# Patient Record
Sex: Male | Born: 1935 | Race: White | Hispanic: No | Marital: Married | Smoking: Former smoker
Health system: Southern US, Community
[De-identification: ages and names within clinical notes are randomized; demographics above are authoritative.]

## PROBLEM LIST (undated history)

## (undated) DIAGNOSIS — G2581 Restless legs syndrome: Secondary | ICD-10-CM

## (undated) DIAGNOSIS — E559 Vitamin D deficiency, unspecified: Secondary | ICD-10-CM

## (undated) DIAGNOSIS — I2510 Atherosclerotic heart disease of native coronary artery without angina pectoris: Secondary | ICD-10-CM

## (undated) DIAGNOSIS — I509 Heart failure, unspecified: Secondary | ICD-10-CM

## (undated) DIAGNOSIS — G4733 Obstructive sleep apnea (adult) (pediatric): Secondary | ICD-10-CM

## (undated) DIAGNOSIS — Z87891 Personal history of nicotine dependence: Secondary | ICD-10-CM

## (undated) DIAGNOSIS — I255 Ischemic cardiomyopathy: Secondary | ICD-10-CM

## (undated) DIAGNOSIS — M1990 Unspecified osteoarthritis, unspecified site: Secondary | ICD-10-CM

## (undated) DIAGNOSIS — M109 Gout, unspecified: Secondary | ICD-10-CM

## (undated) DIAGNOSIS — N289 Disorder of kidney and ureter, unspecified: Secondary | ICD-10-CM

## (undated) DIAGNOSIS — I4891 Unspecified atrial fibrillation: Secondary | ICD-10-CM

## (undated) DIAGNOSIS — M069 Rheumatoid arthritis, unspecified: Secondary | ICD-10-CM

## (undated) DIAGNOSIS — H9190 Unspecified hearing loss, unspecified ear: Secondary | ICD-10-CM

## (undated) DIAGNOSIS — E785 Hyperlipidemia, unspecified: Secondary | ICD-10-CM

## (undated) DIAGNOSIS — Z9989 Dependence on other enabling machines and devices: Secondary | ICD-10-CM

## (undated) DIAGNOSIS — Z9981 Dependence on supplemental oxygen: Secondary | ICD-10-CM

## (undated) DIAGNOSIS — K589 Irritable bowel syndrome without diarrhea: Secondary | ICD-10-CM

## (undated) DIAGNOSIS — J449 Chronic obstructive pulmonary disease, unspecified: Secondary | ICD-10-CM

## (undated) DIAGNOSIS — E119 Type 2 diabetes mellitus without complications: Secondary | ICD-10-CM

## (undated) HISTORY — DX: Unspecified atrial fibrillation: I48.91

## (undated) HISTORY — PX: FINGER AMPUTATION: SHX636

## (undated) HISTORY — DX: Hyperlipidemia, unspecified: E78.5

## (undated) HISTORY — DX: Atherosclerotic heart disease of native coronary artery without angina pectoris: I25.10

## (undated) HISTORY — DX: Disorder of kidney and ureter, unspecified: N28.9

## (undated) HISTORY — PX: TONSILLECTOMY: SUR1361

## (undated) HISTORY — PX: CARDIAC SURGERY: SHX584

## (undated) HISTORY — DX: Heart failure, unspecified: I50.9

## (undated) HISTORY — PX: NECK SURGERY: SHX720

## (undated) HISTORY — DX: Unspecified osteoarthritis, unspecified site: M19.90

## (undated) HISTORY — PX: APPENDECTOMY: SHX54

## (undated) HISTORY — DX: Irritable bowel syndrome, unspecified: K58.9

## (undated) HISTORY — DX: Type 2 diabetes mellitus without complications: E11.9

## (undated) HISTORY — DX: Chronic obstructive pulmonary disease, unspecified: J44.9

## (undated) HISTORY — DX: Ischemic cardiomyopathy: I25.5

---

## 2016-10-02 NOTE — Progress Notes (Signed)
HPI: 82 year old male for evaluation of CAD and atrial fibrillation. Patient has an extensive cardiac history. He was previously cared for in [facility]. I have no records available. 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 He apparently had bypass approximately 8 years ago which was complicated by bleeding and renal insufficiency. He has an ischemic cardiomyopathy but I have no details concerning ejection fraction. He has permanent atrial fibrillation and apparently has not been able to be on any anticoagulants or even aspirin because of recurrent nosebleeds. He has not tolerated Lipitor previously. He also has chronic COPD and uses home oxygen at night and occasionally during the day. He has significant dyspnea on exertion but no orthopnea, PND or chest pain. Also no back pain which is his symptoms at time of prior infarct. Occasional mild pedal edema.  Current Outpatient Prescriptions  Medication Sig Dispense Refill  . acetaminophen (TYLENOL) 500 MG tablet Take 500 mg by mouth every 6 (six) hours as needed.    Marland Kitchen allopurinol (ZYLOPRIM) 300 MG tablet Take 300 mg by mouth daily.    . bumetanide (BUMEX) 1 MG tablet Take 1 mg by mouth daily.    . carvedilol (COREG) 25 MG tablet Take 25 mg by mouth 2 (two) times daily with a meal.    . Cholecalciferol (VITAMIN D3) 1000 units CAPS Take by mouth as directed.    . Cholestyramine POWD by Does not apply route as directed.    . Coenzyme Q10 (CO Q-10) 100 MG CAPS Take by mouth as directed.    . diphenhydrAMINE (ALLERGY RELIEF) 25 mg capsule Take 25 mg by mouth every 6 (six) hours as needed.    . gabapentin (NEURONTIN) 100 MG capsule Take 100 mg by mouth 3 (three) times daily.    Marland Kitchen HYDROcodone-acetaminophen (NORCO/VICODIN) 5-325 MG tablet Take by mouth every 6 (six) hours as needed for moderate pain.    . Insulin Glargine (TOUJEO SOLOSTAR [place]) Inject into the skin as directed.    . Insulin Glulisine (APIDRA [place]) Inject into the skin as  directed.    . metolazone (ZAROXOLYN) 5 MG tablet Take 5 mg by mouth daily.    Marland Kitchen omeprazole (PRILOSEC) 20 MG capsule Take 20 mg by mouth daily.    . potassium chloride (KLOR-CON) 20 MEQ packet Take 20 mEq by mouth daily.    . pravastatin (PRAVACHOL) 20 MG tablet Take 20 mg by mouth daily.    . prednisoLONE 5 MG TABS tablet Take 5 mg by mouth as directed.    . predniSONE (DELTASONE) 10 MG tablet Take 10 mg by mouth as directed.    . promethazine (PHENERGAN) 25 MG tablet Take 25 mg by mouth every 6 (six) hours as needed for nausea or vomiting.    . terazosin (HYTRIN) 2 MG capsule Take 2 mg by mouth at bedtime.     No current facility-administered medications for this visit.     Not on File   Past Medical History:  Diagnosis Date  . A-fib (HCC)   . Arthritis   . CAD (coronary artery disease)   . COPD (chronic obstructive pulmonary disease) (HCC)   . Diabetes (HCC)   . Heart failure (HCC)   . Hyperlipidemia   . IBS (irritable bowel syndrome)   . Ischemic cardiomyopathy   . OSA (obstructive sleep apnea)   . Renal insufficiency     Past Surgical History:  Procedure Laterality Date  . APPENDECTOMY    . CARDIAC SURGERY  BY PASS  . FINGER AMPUTATION Left    RING FINGER  . NECK SURGERY    . TONSILLECTOMY      Social History   Social History  . Marital status: Married    Spouse name: JEAN  . Number of children: 3  . Years of education: COLLEGE   Occupational History  . RETIRED    Social History Main Topics  . Smoking status: Former Games developer  . Smokeless tobacco: Never Used  . Alcohol use No  . Drug use: No  . Sexual activity: Not on file   Other Topics Concern  . Not on file   Social History Narrative  . No narrative on file    Family History  Problem Relation Age of Onset  . CAD Mother   . CAD Father     ROS: Upper respiratory infection for the last 24-48 hours but no fevers or chills, hemoptysis, dysphasia, odynophagia, melena, hematochezia, dysuria,  hematuria, rash, seizure activity, claudication. Remaining systems are negative.  Physical Exam:   Blood pressure 122/78, pulse (!) 122, height 5\' 7"  (1.702 m), weight 195 lb 1.9 oz (88.5 kg).  General:  Well developed/well nourished in NAD Skin warm/dry Patient not depressed No peripheral clubbing Back-normal HEENT-normal/normal eyelids Neck supple/normal carotid upstroke bilaterally; no bruits; no JVD; no thyromegaly chest - CTA/ normal expansion CV - tachycardic and irregular/normal S1 and S2; no murmurs, rubs or gallops;  PMI nondisplaced Abdomen -NT/ND, no HSM, no mass, + bowel sounds, no bruit 2+ femoral pulses, no bruits Ext-no edema, chords, 2+ DP Neuro-grossly nonfocal  ECG -Atrial fibrillation at a rate of 122. Nonspecific ST changes.  A/P  1 coronary artery disease-we will need to obtain all outside records concerning prior bypass surgery, LV function and atrial fibrillation. He apparently has not tolerated aspirin previously because of severe nosebleeds. We will continue statin.  2 Permanent atrial fibrillation-patient apparently has permanent atrial fibrillation and has been years. We will continue carvedilol for rate control. His rate is elevated today and I wonder if this is being driven by his upper respiratory infection. I will arrange for 24-hour Holter monitor and we will likely add digoxin if his rate is elevated. CHADSvasc 5. He would benefit from anticoagulation but apparently has had significant bleeding even on aspirin. I will not add anything at this point. He understands there is a higher risk of stroke off of anticoagulation.  3 ischemic cardiomyopathy-we will plan to repeat echocardiogram to assess LV systolic function. I will obtain all outside records. Continue carvedilol. He would benefit also from an ARB (note cough with ACEI in past) but he apparently has significant renal insufficiency. We will obtain his most recent laboratories from primary care. If his  renal function will not tolerate an ACE inhibitor or ARB we will add hydralazine/nitrates. I would not consider an ICD given his age.  4 chronic stage IV kidney disease-obtain most recent laboratories from primary care.  5 chronic systolic congestive heart failure-he does not appear to be significantly volume overloaded. Continue present dose of diuretics. He is also apparently scheduled to see CHF clinic.  6 COPD/URI-continue present medications. Management per primary care.  7 hyperlipidemia-continue Pravachol. He did not tolerate Lipitor in the past.  I have scheduled the patient to return in one week once I have all of his records available.  , MD

## 2016-10-05 ENCOUNTER — Telehealth (HOSPITAL_COMMUNITY): Payer: Self-pay | Admitting: Vascular Surgery

## 2016-10-05 NOTE — Telephone Encounter (Signed)
Returned pt call to reschedule appt 

## 2016-10-06 ENCOUNTER — Encounter: Payer: Self-pay | Admitting: Cardiology

## 2016-10-06 ENCOUNTER — Telehealth: Payer: Self-pay | Admitting: Cardiology

## 2016-10-06 NOTE — Telephone Encounter (Signed)
Called pt to get information to update in pt's chart. Glynda Jaeger requested medical records again and they are supposed to be faxing them to [facility]. Pt stated that he would bring his medication with him as well. I advised the pt that if he has any other problems, questions or concerns to call the office. Pt verbalized understanding.

## 2016-10-07 ENCOUNTER — Ambulatory Visit (INDEPENDENT_AMBULATORY_CARE_PROVIDER_SITE_OTHER): Payer: Medicare Other | Admitting: Cardiology

## 2016-10-07 ENCOUNTER — Encounter: Payer: Self-pay | Admitting: Cardiology

## 2016-10-07 VITALS — BP 122/78 | HR 122 | Ht 67.0 in | Wt 195.1 lb

## 2016-10-07 DIAGNOSIS — I5022 Chronic systolic (congestive) heart failure: Secondary | ICD-10-CM | POA: Diagnosis not present

## 2016-10-07 DIAGNOSIS — I4891 Unspecified atrial fibrillation: Secondary | ICD-10-CM

## 2016-10-07 DIAGNOSIS — I2510 Atherosclerotic heart disease of native coronary artery without angina pectoris: Secondary | ICD-10-CM

## 2016-10-07 NOTE — Patient Instructions (Addendum)
Medication Instructions:   NO CHANGE  Testing/Procedures:  Your physician has recommended that you wear a 24 HOUR holter monitor. Holter monitors are medical devices that record the heart's electrical activity. Doctors most often use these monitors to diagnose arrhythmias. Arrhythmias are problems with the speed or rhythm of the heartbeat. The monitor is a small, portable device. You can wear one while you do your normal daily activities. This is usually used to diagnose what is causing palpitations/syncope (passing out).   Your physician has requested that you have an echocardiogram. Echocardiography is a painless test that uses sound waves to create images of your heart. It provides your doctor with information about the size and shape of your heart and how well your heart's chambers and valves are working. This procedure takes approximately one hour. There are no restrictions for this procedure.    Follow-Up:  Your physician recommends that you schedule a follow-up appointment

## 2016-10-08 NOTE — Progress Notes (Deleted)
HPI: Follow-up coronary artery disease and atrial fibrillation. Patient recently moved from [facility] 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 He apparently had bypass approximately 8 years ago which was complicated by bleeding and renal insufficiency. He has an ischemic cardiomyopathy but I have no details concerning ejection fraction. He has permanent atrial fibrillation and apparently has not been able to be on any anticoagulants or even aspirin because of recurrent nosebleeds. He has not tolerated Lipitor previously. He also has chronic COPD and uses home oxygen at night and occasionally during the day. He has significant dyspnea on exertion but no orthopnea, PND or chest pain. Also no back pain which is his symptoms at time of prior infarct. Occasional mild pedal edema. Laboratories reviewed from October 2017. Creatinine 1.48. BNP 195. Hemoglobin 11.  Current Outpatient Prescriptions  Medication Sig Dispense Refill  . acetaminophen (TYLENOL) 500 MG tablet Take 500 mg by mouth every 6 (six) hours as needed.    Marland Kitchen allopurinol (ZYLOPRIM) 300 MG tablet Take 300 mg by mouth daily.    . bumetanide (BUMEX) 1 MG tablet Take 1 mg by mouth daily.    . carvedilol (COREG) 25 MG tablet Take 25 mg by mouth 2 (two) times daily with a meal.    . Cholecalciferol (VITAMIN D3) 1000 units CAPS Take by mouth as directed.    . Cholestyramine POWD by Does not apply route as directed.    . Coenzyme Q10 (CO Q-10) 100 MG CAPS Take by mouth as directed.    . diphenhydrAMINE (ALLERGY RELIEF) 25 mg capsule Take 25 mg by mouth every 6 (six) hours as needed.    . gabapentin (NEURONTIN) 100 MG capsule Take 100 mg by mouth 3 (three) times daily.    Marland Kitchen HYDROcodone-acetaminophen (NORCO/VICODIN) 5-325 MG tablet Take by mouth every 6 (six) hours as needed for moderate pain.    . Insulin Glargine (TOUJEO SOLOSTAR [place]) Inject into the skin as directed.    . Insulin Glulisine (APIDRA [place]) Inject into the skin as  directed.    . metolazone (ZAROXOLYN) 5 MG tablet Take 5 mg by mouth daily.    Marland Kitchen omeprazole (PRILOSEC) 20 MG capsule Take 20 mg by mouth daily.    . potassium chloride (KLOR-CON) 20 MEQ packet Take 20 mEq by mouth daily.    . pravastatin (PRAVACHOL) 20 MG tablet Take 20 mg by mouth daily.    . prednisoLONE 5 MG TABS tablet Take 5 mg by mouth as directed.    . predniSONE (DELTASONE) 10 MG tablet Take 10 mg by mouth as directed.    . promethazine (PHENERGAN) 25 MG tablet Take 25 mg by mouth every 6 (six) hours as needed for nausea or vomiting.    . terazosin (HYTRIN) 2 MG capsule Take 2 mg by mouth at bedtime.     No current facility-administered medications for this visit.      Past Medical History:  Diagnosis Date  . A-fib (HCC)   . Arthritis   . CAD (coronary artery disease)   . COPD (chronic obstructive pulmonary disease) (HCC)   . Diabetes (HCC)   . Heart failure (HCC)   . Hyperlipidemia   . IBS (irritable bowel syndrome)   . Ischemic cardiomyopathy   . OSA (obstructive sleep apnea)   . Renal insufficiency     Past Surgical History:  Procedure Laterality Date  . APPENDECTOMY    . CARDIAC SURGERY     BY PASS  .  FINGER AMPUTATION Left    RING FINGER  . NECK SURGERY    . TONSILLECTOMY      Social History   Social History  . Marital status: Married    Spouse name: JEAN  . Number of children: 3  . Years of education: COLLEGE   Occupational History  . RETIRED    Social History Main Topics  . Smoking status: Former Games developer  . Smokeless tobacco: Never Used  . Alcohol use No  . Drug use: No  . Sexual activity: Not on file   Other Topics Concern  . Not on file   Social History Narrative  . No narrative on file    Family History  Problem Relation Age of Onset  . CAD Mother   . CAD Father     ROS: no fevers or chills, productive cough, hemoptysis, dysphasia, odynophagia, melena, hematochezia, dysuria, hematuria, rash, seizure activity, orthopnea, PND,  pedal edema, claudication. Remaining systems are negative.  Physical Exam: Well-developed well-nourished in no acute distress.  Skin is warm and dry.  HEENT is normal.  Neck is supple.  Chest is clear to auscultation with normal expansion.  Cardiovascular exam is regular rate and rhythm.  Abdominal exam nontender or distended. No masses palpated. Extremities show no edema. neuro grossly intact  ECG

## 2016-10-13 ENCOUNTER — Observation Stay (HOSPITAL_COMMUNITY)
Admission: EM | Admit: 2016-10-13 | Discharge: 2016-10-14 | Disposition: A | Discharge status: Home / Self Care | Payer: Medicare Other | Attending: Internal Medicine | Admitting: Internal Medicine

## 2016-10-13 ENCOUNTER — Encounter (HOSPITAL_COMMUNITY): Payer: Self-pay

## 2016-10-13 ENCOUNTER — Emergency Department (HOSPITAL_COMMUNITY): Payer: Medicare Other

## 2016-10-13 DIAGNOSIS — I255 Ischemic cardiomyopathy: Secondary | ICD-10-CM | POA: Diagnosis not present

## 2016-10-13 DIAGNOSIS — I081 Rheumatic disorders of both mitral and tricuspid valves: Secondary | ICD-10-CM | POA: Diagnosis not present

## 2016-10-13 DIAGNOSIS — Z9981 Dependence on supplemental oxygen: Secondary | ICD-10-CM | POA: Insufficient documentation

## 2016-10-13 DIAGNOSIS — Z7952 Long term (current) use of systemic steroids: Secondary | ICD-10-CM | POA: Insufficient documentation

## 2016-10-13 DIAGNOSIS — E119 Type 2 diabetes mellitus without complications: Secondary | ICD-10-CM

## 2016-10-13 DIAGNOSIS — E118 Type 2 diabetes mellitus with unspecified complications: Secondary | ICD-10-CM

## 2016-10-13 DIAGNOSIS — K589 Irritable bowel syndrome without diarrhea: Secondary | ICD-10-CM

## 2016-10-13 DIAGNOSIS — J449 Chronic obstructive pulmonary disease, unspecified: Secondary | ICD-10-CM

## 2016-10-13 DIAGNOSIS — R079 Chest pain, unspecified: Secondary | ICD-10-CM | POA: Diagnosis present

## 2016-10-13 DIAGNOSIS — M069 Rheumatoid arthritis, unspecified: Secondary | ICD-10-CM | POA: Diagnosis present

## 2016-10-13 DIAGNOSIS — N184 Chronic kidney disease, stage 4 (severe): Secondary | ICD-10-CM | POA: Insufficient documentation

## 2016-10-13 DIAGNOSIS — G2581 Restless legs syndrome: Secondary | ICD-10-CM

## 2016-10-13 DIAGNOSIS — I2510 Atherosclerotic heart disease of native coronary artery without angina pectoris: Secondary | ICD-10-CM | POA: Diagnosis not present

## 2016-10-13 DIAGNOSIS — Z89022 Acquired absence of left finger(s): Secondary | ICD-10-CM | POA: Insufficient documentation

## 2016-10-13 DIAGNOSIS — I5022 Chronic systolic (congestive) heart failure: Secondary | ICD-10-CM | POA: Insufficient documentation

## 2016-10-13 DIAGNOSIS — Z794 Long term (current) use of insulin: Secondary | ICD-10-CM

## 2016-10-13 DIAGNOSIS — N179 Acute kidney failure, unspecified: Secondary | ICD-10-CM | POA: Insufficient documentation

## 2016-10-13 DIAGNOSIS — R5383 Other fatigue: Secondary | ICD-10-CM

## 2016-10-13 DIAGNOSIS — Z9989 Dependence on other enabling machines and devices: Secondary | ICD-10-CM

## 2016-10-13 DIAGNOSIS — H9190 Unspecified hearing loss, unspecified ear: Secondary | ICD-10-CM | POA: Insufficient documentation

## 2016-10-13 DIAGNOSIS — M109 Gout, unspecified: Secondary | ICD-10-CM

## 2016-10-13 DIAGNOSIS — G4733 Obstructive sleep apnea (adult) (pediatric): Secondary | ICD-10-CM

## 2016-10-13 DIAGNOSIS — I482 Chronic atrial fibrillation, unspecified: Secondary | ICD-10-CM

## 2016-10-13 DIAGNOSIS — Z9889 Other specified postprocedural states: Secondary | ICD-10-CM | POA: Insufficient documentation

## 2016-10-13 DIAGNOSIS — E1122 Type 2 diabetes mellitus with diabetic chronic kidney disease: Secondary | ICD-10-CM | POA: Insufficient documentation

## 2016-10-13 DIAGNOSIS — E1165 Type 2 diabetes mellitus with hyperglycemia: Secondary | ICD-10-CM | POA: Insufficient documentation

## 2016-10-13 DIAGNOSIS — E559 Vitamin D deficiency, unspecified: Secondary | ICD-10-CM

## 2016-10-13 DIAGNOSIS — R739 Hyperglycemia, unspecified: Secondary | ICD-10-CM | POA: Diagnosis present

## 2016-10-13 DIAGNOSIS — Y929 Unspecified place or not applicable: Secondary | ICD-10-CM | POA: Diagnosis not present

## 2016-10-13 DIAGNOSIS — R9431 Abnormal electrocardiogram [ECG] [EKG]: Secondary | ICD-10-CM | POA: Diagnosis present

## 2016-10-13 DIAGNOSIS — E785 Hyperlipidemia, unspecified: Secondary | ICD-10-CM | POA: Diagnosis not present

## 2016-10-13 DIAGNOSIS — R110 Nausea: Secondary | ICD-10-CM

## 2016-10-13 DIAGNOSIS — E876 Hypokalemia: Secondary | ICD-10-CM | POA: Diagnosis not present

## 2016-10-13 DIAGNOSIS — I4891 Unspecified atrial fibrillation: Secondary | ICD-10-CM | POA: Diagnosis present

## 2016-10-13 DIAGNOSIS — Z881 Allergy status to other antibiotic agents status: Secondary | ICD-10-CM | POA: Insufficient documentation

## 2016-10-13 DIAGNOSIS — I509 Heart failure, unspecified: Secondary | ICD-10-CM

## 2016-10-13 DIAGNOSIS — M1990 Unspecified osteoarthritis, unspecified site: Secondary | ICD-10-CM

## 2016-10-13 DIAGNOSIS — Z888 Allergy status to other drugs, medicaments and biological substances status: Secondary | ICD-10-CM | POA: Insufficient documentation

## 2016-10-13 DIAGNOSIS — N289 Disorder of kidney and ureter, unspecified: Secondary | ICD-10-CM

## 2016-10-13 DIAGNOSIS — Z8249 Family history of ischemic heart disease and other diseases of the circulatory system: Secondary | ICD-10-CM | POA: Insufficient documentation

## 2016-10-13 DIAGNOSIS — T502X5A Adverse effect of carbonic-anhydrase inhibitors, benzothiadiazides and other diuretics, initial encounter: Secondary | ICD-10-CM | POA: Diagnosis not present

## 2016-10-13 DIAGNOSIS — D72829 Elevated white blood cell count, unspecified: Secondary | ICD-10-CM | POA: Diagnosis not present

## 2016-10-13 DIAGNOSIS — Z87891 Personal history of nicotine dependence: Secondary | ICD-10-CM

## 2016-10-13 DIAGNOSIS — Z951 Presence of aortocoronary bypass graft: Secondary | ICD-10-CM | POA: Insufficient documentation

## 2016-10-13 DIAGNOSIS — N189 Chronic kidney disease, unspecified: Secondary | ICD-10-CM

## 2016-10-13 DIAGNOSIS — H9193 Unspecified hearing loss, bilateral: Secondary | ICD-10-CM

## 2016-10-13 HISTORY — DX: Rheumatoid arthritis, unspecified: M06.9

## 2016-10-13 HISTORY — DX: Obstructive sleep apnea (adult) (pediatric): G47.33

## 2016-10-13 HISTORY — DX: Dependence on supplemental oxygen: Z99.81

## 2016-10-13 HISTORY — DX: Personal history of nicotine dependence: Z87.891

## 2016-10-13 HISTORY — DX: Gout, unspecified: M10.9

## 2016-10-13 HISTORY — DX: Dependence on other enabling machines and devices: Z99.89

## 2016-10-13 HISTORY — DX: Restless legs syndrome: G25.81

## 2016-10-13 HISTORY — DX: Unspecified hearing loss, unspecified ear: H91.90

## 2016-10-13 HISTORY — DX: Vitamin D deficiency, unspecified: E55.9

## 2016-10-13 LAB — BASIC METABOLIC PANEL
Anion gap: 16 — ABNORMAL HIGH (ref 5–15)
BUN: 51 mg/dL — ABNORMAL HIGH (ref 6–20)
CALCIUM: 9.4 mg/dL (ref 8.9–10.3)
CO2: 29 mmol/L (ref 22–32)
CREATININE: 2.08 mg/dL — AB (ref 0.61–1.24)
Chloride: 88 mmol/L — ABNORMAL LOW (ref 101–111)
GFR, EST AFRICAN AMERICAN: 32 mL/min — AB (ref >60)
GFR, EST NON AFRICAN AMERICAN: 27 mL/min — AB (ref >60)
GLUCOSE: 241 mg/dL — AB (ref 65–99)
Potassium: 2.8 mmol/L — ABNORMAL LOW (ref 3.5–5.1)
Sodium: 133 mmol/L — ABNORMAL LOW (ref 135–145)

## 2016-10-13 LAB — CBC
HCT: 41.8 % (ref 39.0–52.0)
Hemoglobin: 13.9 g/dL (ref 13.0–17.0)
MCH: 26.9 pg (ref 26.0–34.0)
MCHC: 33.3 g/dL (ref 30.0–36.0)
MCV: 80.9 fL (ref 78.0–100.0)
PLATELETS: 134 10*3/uL — AB (ref 150–400)
RBC: 5.17 MIL/uL (ref 4.22–5.81)
RDW: 15 % (ref 11.5–15.5)
WBC: 15 10*3/uL — ABNORMAL HIGH (ref 4.0–10.5)

## 2016-10-13 LAB — I-STAT TROPONIN, ED: TROPONIN I, POC: 0.01 ng/mL (ref 0.00–0.08)

## 2016-10-13 LAB — BRAIN NATRIURETIC PEPTIDE: B Natriuretic Peptide: 114.8 pg/mL — ABNORMAL HIGH (ref 0.0–100.0)

## 2016-10-13 LAB — URINALYSIS, ROUTINE W REFLEX MICROSCOPIC
Bilirubin Urine: NEGATIVE
GLUCOSE, UA: NEGATIVE mg/dL
HGB URINE DIPSTICK: NEGATIVE
Ketones, ur: NEGATIVE mg/dL
Leukocytes, UA: NEGATIVE
Nitrite: NEGATIVE
Protein, ur: NEGATIVE mg/dL
SPECIFIC GRAVITY, URINE: 1.01 (ref 1.005–1.030)
pH: 6.5 (ref 5.0–8.0)

## 2016-10-13 LAB — GLUCOSE, CAPILLARY: Glucose-Capillary: 357 mg/dL — ABNORMAL HIGH (ref 65–99)

## 2016-10-13 LAB — MAGNESIUM: Magnesium: 2 mg/dL (ref 1.7–2.4)

## 2016-10-13 LAB — TROPONIN I
Troponin I: 0.03 ng/mL (ref <0.03)
Troponin I: 0.03 ng/mL (ref <0.03)

## 2016-10-13 LAB — I-STAT CG4 LACTIC ACID, ED
Lactic Acid, Venous: 1.37 mmol/L (ref 0.5–1.9)
Lactic Acid, Venous: 1.39 mmol/L (ref 0.5–1.9)

## 2016-10-13 MED ORDER — CHOLESTYRAMINE 4 G PO PACK
4.0000 g | PACK | Freq: Two times a day (BID) | ORAL | Status: DC
  Administered 2016-10-13 – 2016-10-14 (×2): 4 g via ORAL
  Filled 2016-10-13 (×3): qty 1

## 2016-10-13 MED ORDER — PANTOPRAZOLE SODIUM 40 MG PO TBEC
40.0000 mg | DELAYED_RELEASE_TABLET | Freq: Every day | ORAL | Status: DC
  Administered 2016-10-13: 40 mg via ORAL
  Filled 2016-10-13: qty 1

## 2016-10-13 MED ORDER — CHOLESTYRAMINE POWD: 1.0000 | Freq: Two times a day (BID) | Status: DC

## 2016-10-13 MED ORDER — ACETAMINOPHEN 500 MG PO TABS: 500.0000 mg | ORAL_TABLET | Freq: Four times a day (QID) | ORAL | Status: DC | PRN

## 2016-10-13 MED ORDER — NITROGLYCERIN 0.4 MG SL SUBL: 0.4000 mg | SUBLINGUAL_TABLET | SUBLINGUAL | Status: DC | PRN

## 2016-10-13 MED ORDER — PRAVASTATIN SODIUM 20 MG PO TABS
20.0000 mg | ORAL_TABLET | Freq: Every day | ORAL | Status: DC
  Administered 2016-10-13 – 2016-10-14 (×2): 20 mg via ORAL
  Filled 2016-10-13 (×2): qty 1

## 2016-10-13 MED ORDER — INSULIN ASPART 100 UNIT/ML ~~LOC~~ SOLN
5.0000 [IU] | Freq: Once | SUBCUTANEOUS | Status: AC
  Administered 2016-10-13: 5 [IU] via SUBCUTANEOUS

## 2016-10-13 MED ORDER — ONDANSETRON HCL 4 MG/2ML IJ SOLN: 4.0000 mg | Freq: Four times a day (QID) | INTRAMUSCULAR | Status: DC | PRN

## 2016-10-13 MED ORDER — MIRTAZAPINE 15 MG PO TABS
15.0000 mg | ORAL_TABLET | Freq: Every day | ORAL | Status: DC
  Administered 2016-10-13: 15 mg via ORAL
  Filled 2016-10-13: qty 1

## 2016-10-13 MED ORDER — SODIUM CHLORIDE 0.9 % IV SOLN
30.0000 meq | Freq: Once | INTRAVENOUS | Status: AC
  Administered 2016-10-13: 30 meq via INTRAVENOUS
  Filled 2016-10-13: qty 15

## 2016-10-13 MED ORDER — CARVEDILOL 25 MG PO TABS
25.0000 mg | ORAL_TABLET | Freq: Two times a day (BID) | ORAL | Status: DC
  Administered 2016-10-13 – 2016-10-14 (×3): 25 mg via ORAL
  Filled 2016-10-13 (×3): qty 1

## 2016-10-13 MED ORDER — INSULIN ASPART 100 UNIT/ML ~~LOC~~ SOLN
10.0000 [IU] | Freq: Three times a day (TID) | SUBCUTANEOUS | Status: DC
  Administered 2016-10-14 (×3): 10 [IU] via SUBCUTANEOUS

## 2016-10-13 MED ORDER — PANTOPRAZOLE SODIUM 40 MG PO TBEC: 40.0000 mg | DELAYED_RELEASE_TABLET | Freq: Every day | ORAL | Status: DC

## 2016-10-13 MED ORDER — MORPHINE SULFATE (PF) 2 MG/ML IV SOLN: 1.0000 mg | INTRAVENOUS | Status: DC | PRN

## 2016-10-13 MED ORDER — UMECLIDINIUM BROMIDE 62.5 MCG/INH IN AEPB: 1.0000 | INHALATION_SPRAY | Freq: Every day | RESPIRATORY_TRACT | Status: DC

## 2016-10-13 MED ORDER — ALBUTEROL SULFATE (2.5 MG/3ML) 0.083% IN NEBU: 3.0000 mL | INHALATION_SOLUTION | Freq: Four times a day (QID) | RESPIRATORY_TRACT | Status: DC | PRN

## 2016-10-13 MED ORDER — INSULIN ASPART 100 UNIT/ML ~~LOC~~ SOLN: 10.0000 [IU] | Freq: Three times a day (TID) | SUBCUTANEOUS | Status: DC

## 2016-10-13 MED ORDER — ENOXAPARIN SODIUM 30 MG/0.3ML ~~LOC~~ SOLN
30.0000 mg | SUBCUTANEOUS | Status: DC
  Administered 2016-10-13: 30 mg via SUBCUTANEOUS
  Filled 2016-10-13: qty 0.3

## 2016-10-13 MED ORDER — LOPERAMIDE HCL 2 MG PO CAPS: 2.0000 mg | ORAL_CAPSULE | Freq: Four times a day (QID) | ORAL | Status: DC | PRN

## 2016-10-13 MED ORDER — POTASSIUM CHLORIDE CRYS ER 20 MEQ PO TBCR
40.0000 meq | EXTENDED_RELEASE_TABLET | Freq: Once | ORAL | Status: AC
  Administered 2016-10-13: 40 meq via ORAL
  Filled 2016-10-13: qty 2

## 2016-10-13 MED ORDER — SODIUM CHLORIDE 0.9 % IV SOLN
INTRAVENOUS | Status: DC
  Administered 2016-10-13: 100 mL/h via INTRAVENOUS

## 2016-10-13 MED ORDER — PREDNISONE 5 MG PO TABS
15.0000 mg | ORAL_TABLET | Freq: Every day | ORAL | Status: DC
  Administered 2016-10-14: 15 mg via ORAL
  Filled 2016-10-13: qty 1

## 2016-10-13 MED ORDER — DIPHENHYDRAMINE HCL 25 MG PO CAPS: 25.0000 mg | ORAL_CAPSULE | Freq: Four times a day (QID) | ORAL | Status: DC | PRN

## 2016-10-13 MED ORDER — ALLOPURINOL 100 MG PO TABS
100.0000 mg | ORAL_TABLET | Freq: Every day | ORAL | Status: DC
  Administered 2016-10-14: 100 mg via ORAL
  Filled 2016-10-13: qty 1

## 2016-10-13 MED ORDER — ACETAMINOPHEN 325 MG PO TABS: 650.0000 mg | ORAL_TABLET | ORAL | Status: DC | PRN

## 2016-10-13 MED ORDER — TERAZOSIN HCL 2 MG PO CAPS
2.0000 mg | ORAL_CAPSULE | Freq: Every day | ORAL | Status: DC
  Administered 2016-10-13: 2 mg via ORAL
  Filled 2016-10-13 (×2): qty 1

## 2016-10-13 MED ORDER — TIOTROPIUM BROMIDE MONOHYDRATE 18 MCG IN CAPS
18.0000 ug | ORAL_CAPSULE | Freq: Every day | RESPIRATORY_TRACT | Status: DC
  Administered 2016-10-14: 18 ug via RESPIRATORY_TRACT
  Filled 2016-10-13: qty 5

## 2016-10-13 MED ORDER — INSULIN ASPART 100 UNIT/ML ~~LOC~~ SOLN
0.0000 [IU] | Freq: Three times a day (TID) | SUBCUTANEOUS | Status: DC
  Administered 2016-10-14 (×2): 9 [IU] via SUBCUTANEOUS
  Administered 2016-10-14: 5 [IU] via SUBCUTANEOUS

## 2016-10-13 NOTE — ED Notes (Signed)
Per MD Laural Benes cancel repeat istat lactic acid

## 2016-10-13 NOTE — H&P (Signed)
History and Physical  Devin Heath:[id number] DOB: 1929/08/06 DOA: 10/13/2016  Referring physician: Dr. Early Osmond PCP: Johnella Moloney [facility]  Outpatient Specialists:  1. Velta Addison - Cardiology  Chief Complaint: weakness  HPI: Devin Heath is a 82 y.o. male (recently relocated to this area from [facility]) with extensive CAD status post CABG, CHF, COPD, chronic AFib, CKD Stage 3/4 who presented to the ED at the request of primary care provider over concerns about complaints of generalized weakness and chest pressure (he never has pain even with his heart attacks) which can be typical for diabetic patients.   He has not had any chest pressure symptoms in last 2 days prior to admission.  He has established care with Dr. Olga Millers for cardiology care and had an echo ordered for later in December and ordered to wear a holter monitor.   He has ischemic cardiomyopathy according to reports and family account.  He has history of severe bleeding and not able to tolerate any form of anticoagulation according to history.  He has not even been able to tolerate baby aspirin according to family.  Patient saw his rheumatologist to establish care for his rheumatoid arthritis and gout earlier last week who obtained labs.  They were notable for elevated creatinine at 2.2 which was an increase from 1.5 previously.  However he does endorse several days of fatigue and nausea. Denies any fevers, chills, chest pain, shortness of breath, abdominal pain, vomiting, diarrhea (he has chronic loose stools from IBS), difficulty urinating.  He does have a significant cardiac history and family reports that his last known EF was near 20%.  He has an appointment to establish care with advanced heart failure clinic with Dr. Shirlee Latch.   In the ED: Pt was evaluated and noted to have significant hypokalemia, hyperglycemia and elevated creatinine at 2.2.  Given his chest pain symptoms and history of ischemic CAD and  cardiomyopathy an observation was requested.  He had some noticeable EKG changes on admission.  He will be monitored on telemetry and cardiology will be consulted and his electrolytes will be corrected.     Review of Systems: All systems reviewed and apart from history of presenting illness, are negative.  Past Medical History:  Diagnosis Date  . A-fib (HCC)   . Arthritis   . CAD (coronary artery disease)   . COPD (chronic obstructive pulmonary disease) (HCC)   . Diabetes (HCC)   . Heart failure (HCC)   . Hyperlipidemia   . IBS (irritable bowel syndrome)   . Ischemic cardiomyopathy   . OSA (obstructive sleep apnea)   . Renal insufficiency    Past Surgical History:  Procedure Laterality Date  . APPENDECTOMY    . CARDIAC SURGERY     BY PASS  . FINGER AMPUTATION Left    RING FINGER  . NECK SURGERY    . TONSILLECTOMY     Social History:  reports that he has quit smoking. He has never used smokeless tobacco. He reports that he does not drink alcohol or use drugs.   Allergies  Allergen Reactions  . Ace Inhibitors   . Clindamycin/Lincomycin   . Erythromycin   . Feldene [Piroxicam]   . Keflex [Cephalexin]   . Omnicef [Cefdinir]   . Simvastatin     Family History  Problem Relation Age of Onset  . CAD Mother   . CAD Father     Prior to Admission medications   Medication Sig Start Date End Date  Taking? Authorizing Provider  acetaminophen (TYLENOL) 500 MG tablet Take 500 mg by mouth every 6 (six) hours as needed.    Historical Provider, MD  allopurinol (ZYLOPRIM) 300 MG tablet Take 300 mg by mouth daily.    Historical Provider, MD  bumetanide (BUMEX) 1 MG tablet Take 1 mg by mouth daily.    Historical Provider, MD  carvedilol (COREG) 25 MG tablet Take 25 mg by mouth 2 (two) times daily with a meal.    Historical Provider, MD  Cholecalciferol (VITAMIN D3) 1000 units CAPS Take by mouth as directed.    Historical Provider, MD  Cholestyramine POWD by Does not apply route as  directed.    Historical Provider, MD  Coenzyme Q10 (CO Q-10) 100 MG CAPS Take by mouth as directed.    Historical Provider, MD  diphenhydrAMINE (ALLERGY RELIEF) 25 mg capsule Take 25 mg by mouth every 6 (six) hours as needed.    Historical Provider, MD  gabapentin (NEURONTIN) 100 MG capsule Take 100 mg by mouth 3 (three) times daily.    Historical Provider, MD  HYDROcodone-acetaminophen (NORCO/VICODIN) 5-325 MG tablet Take by mouth every 6 (six) hours as needed for moderate pain.    Historical Provider, MD  Insulin Glargine (TOUJEO SOLOSTAR [place]) Inject into the skin as directed.    Historical Provider, MD  Insulin Glulisine (APIDRA [place]) Inject into the skin as directed.    Historical Provider, MD  metolazone (ZAROXOLYN) 5 MG tablet Take 5 mg by mouth daily.    Historical Provider, MD  omeprazole (PRILOSEC) 20 MG capsule Take 20 mg by mouth daily.    Historical Provider, MD  potassium chloride (KLOR-CON) 20 MEQ packet Take 20 mEq by mouth daily.    Historical Provider, MD  pravastatin (PRAVACHOL) 20 MG tablet Take 20 mg by mouth daily.    Historical Provider, MD  prednisoLONE 5 MG TABS tablet Take 5 mg by mouth as directed.    Historical Provider, MD  predniSONE (DELTASONE) 10 MG tablet Take 10 mg by mouth as directed.    Historical Provider, MD  promethazine (PHENERGAN) 25 MG tablet Take 25 mg by mouth every 6 (six) hours as needed for nausea or vomiting.    Historical Provider, MD  terazosin (HYTRIN) 2 MG capsule Take 2 mg by mouth at bedtime.    Historical Provider, MD   Physical Exam: Vitals:   10/13/16 1645 10/13/16 1700 10/13/16 1715 10/13/16 1730  BP: 127/63 121/66 117/61 113/64  Pulse: 88 90 111 93  Resp: 19 20 18 18   Temp:      TempSrc:      SpO2: 95% 95% 95% 94%  Weight:      Height:        Constitutional: He is oriented to person, place, and time. He appears well-developed and well-nourished. No distress.  HENT: Head: Normocephalic and atraumatic.  Nose: Nose normal.  Eyes:  Conjunctivae and EOM are normal. Pupils are equal, round, and reactive to light. Right eye exhibits no discharge. Left eye exhibits no discharge. No scleral icterus.  Neck: Normal range of motion. Neck supple.  Cardiovascular: irregularly irregular.  Exam reveals no gallop and no friction rub.  No murmur heard. Pulmonary/Chest: Effort normal and breath sounds normal. No stridor. No respiratory distress. He has no rales.  Abdominal: Soft. Normal bowel sounds.  He exhibits no distension. There is no tenderness.  Musculoskeletal: He exhibits no edema or tenderness.  Neurological: He is alert and oriented to person, place, and time.  Skin:  Skin is warm and dry. No rash noted. He is not diaphoretic. No erythema.  Psychiatric: He has a normal mood and affect.   Labs on Admission:  Basic Metabolic Panel:  Recent Labs Lab 10/13/16 1220  NA 133*  K 2.8*  CL 88*  CO2 29  GLUCOSE 241*  BUN 51*  CREATININE 2.08*  CALCIUM 9.4   Liver Function Tests: No results for input(s): AST, ALT, ALKPHOS, BILITOT, PROT, ALBUMIN in the last 168 hours. No results for input(s): LIPASE, AMYLASE in the last 168 hours. No results for input(s): AMMONIA in the last 168 hours. CBC:  Recent Labs Lab 10/13/16 1220  WBC 15.0*  HGB 13.9  HCT 41.8  MCV 80.9  PLT 134*   Cardiac Enzymes: No results for input(s): CKTOTAL, CKMB, CKMBINDEX, TROPONINI in the last 168 hours.  BNP (last 3 results) No results for input(s): PROBNP in the last 8760 hours. CBG: No results for input(s): GLUCAP in the last 168 hours.  Radiological Exams on Admission: Dg Chest 2 View  Result Date: 10/13/2016 CLINICAL DATA:  Chest pain, weakness EXAM: CHEST  2 VIEW COMPARISON:  None. FINDINGS: Calcified granuloma in the left lower lung. No focal consolidation. No pleural effusion or pneumothorax. The heart is mildly enlarged. Postsurgical changes related to prior CABG. Degenerative changes of the visualized thoracolumbar spine. Median  sternotomy. IMPRESSION: No evidence of acute cardiopulmonary disease. Electronically Signed   By: Charline Bills M.D.   On: 10/13/2016 12:44   EKG: Independently reviewed.   Assessment/Plan Principal Problem:   Hypokalemia Active Problems:   Chest pain with high risk for cardiac etiology   Abnormal EKG   Acute on chronic renal insufficiency   OSA (obstructive sleep apnea)   Ischemic cardiomyopathy   IBS (irritable bowel syndrome)   Hyperlipidemia   Heart failure (HCC)   Diabetes (HCC)   COPD (chronic obstructive pulmonary disease) (HCC)   CAD (coronary artery disease)   Rheumatoid arthritis (HCC)   A-fib (HCC)   Hyperglycemia   Leukocytosis   Hypokalemia from diuretic use - He has been taking bumex 1 mg BID and taking KCl 10 meq daily.  Family reports that he is very compliant with taking his medications.  Would hold bumex briefly while he is gently hydrated and potassium is corrected.  He was hydrated in the ED and oral and IV potassium given.  I would check a magnesium level and correct if needed.  Monitor on continuous telemetry and recheck BMP in AM.   Ischemic cardiomyopathy - He recently saw Dr. Jens Som and echo was ordered for 12/8.  Consult cardiology.  Cycle troponin, monitor telemetry.  Check 2D Echo in hospital.    Generalized Weakness - likely related to electrolyte abnormalities and dehydration, no hypoglycemia noted but he is at higher risk given that he takes Toujeo which is a longer active basal insulin and he has acute renal failure.  Would not give more basal insulin right now until we have hydrated him further and rechecked the renal function.  PT eval ordered.  Fall precautions.  Telemetry monitoring.    Chest Pain with high risk for cardiac etiology - cycle troponin as above and given that his full cardiac history is unknown, I think the cardiology team should be involved in his care.    Leukocytosis - repeat CBC/diff in AM.  No s/s of infection found.  He  has been taking prednisone daily for RA and gout.    Abnormal EKG - Plan to monitor on  telemetry, repeat EKG in AM after repletion of electrolytes.   Acute on chronic Renal Failure stage 3/4 - Unsure what his baseline renal function is but ED doctor reported that patient's rheumatologist said that there was a recent Cr of 1.4 but was not able to confirm that.  Repeat renal function panel in AM.    Chronic atrial Fibrillation - Pt unable to take any anticoagulation because of severe bleeding in the past.  He cannot even tolerate aspirin because he is so sensitive to it he bleeds severely (reported).   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  Last dose was last night.   Prandial novolog coverage was ordered as 10 units TIDWC but should titrate up as needed.  He is on prednisone and likely is going to have postprandial hyperglycemia.  He normally takes 14 units of apidra with each meal.  I am being conservative on admission because I am concerned about causing hypoglycemia but should titrate up his prandial insulin doses rapidly if his blood sugar is not controlled in hospital.     COPD - appears to be stable at this time, follow clinically. Resume all home respiratory medications as ordered.  Hyperlipidemia - check fasting lipid panel in the morning. Continue home pravastatin.   OSA - nightly CPAP ordered.   Rheumatoid Arthritis - resume prednisone daily.  He takes 15 mg.    Gout - resume home allopurinol daily.    IBS - He takes anti-diarrheal meds as needed and cholestyramine powder BID which will be continued.    DVT Prophylaxis: lovenox Code Status: FULL  Family Communication: bedside  Disposition Plan: TBD   Time spent: 69 mins  Standley Dakins,  MD [facility] Pager [phone]  If 7PM-7AM, please contact night-coverage [email or web address] Password [facility] 10/13/2016, 6:04 PM

## 2016-10-13 NOTE — ED Notes (Signed)
MD at bedside talking with pt and family and pharmacy.

## 2016-10-13 NOTE — ED Provider Notes (Signed)
[facility]-EMERGENCY DEPT Provider Note   CSN: [id number] Arrival date & time: 10/13/16  1203     History   Chief Complaint Chief Complaint  Patient presents with  . Chest Pain  . Abnormal Lab    HPI Devin Heath is a 82 y.o. male.  HPI  82 year old male with a history of CAD status post CABG, CHF, COPD, A. Fib, CK D states 3 who presents to the ED at the request of primary care provider. Patient saw his rheumatologist to establish care earlier last week who obtained labs. Labs resulted in were notable for elevated creatinine at 2.2 which was an increase from 1.5 previously. During the conversation the patient mentioned that he was having chest pain pain thus patient was sent here for evaluation. Here the patient states that he has not had any chest pain for over 48 hours. However he does endorse several days of fatigue and nausea. Denies any fevers, chills, chest pain, shortness of breath, abdominal pain, vomiting, diarrhea, difficulty urinating.   Past Medical History:  Diagnosis Date  . A-fib (HCC)   . Arthritis   . CAD (coronary artery disease)   . COPD (chronic obstructive pulmonary disease) (HCC)   . Diabetes (HCC)   . Heart failure (HCC)   . Hyperlipidemia   . IBS (irritable bowel syndrome)   . Ischemic cardiomyopathy   . OSA (obstructive sleep apnea)   . Renal insufficiency     There are no active problems to display for this patient.   Past Surgical History:  Procedure Laterality Date  . APPENDECTOMY    . CARDIAC SURGERY     BY PASS  . FINGER AMPUTATION Left    RING FINGER  . NECK SURGERY    . TONSILLECTOMY         Home Medications    Prior to Admission medications   Medication Sig Start Date End Date Taking? Authorizing Provider  acetaminophen (TYLENOL) 500 MG tablet Take 500 mg by mouth every 6 (six) hours as needed.    Historical Provider, MD  allopurinol (ZYLOPRIM) 300 MG tablet Take 300 mg by mouth daily.    Historical Provider, MD    bumetanide (BUMEX) 1 MG tablet Take 1 mg by mouth daily.    Historical Provider, MD  carvedilol (COREG) 25 MG tablet Take 25 mg by mouth 2 (two) times daily with a meal.    Historical Provider, MD  Cholecalciferol (VITAMIN D3) 1000 units CAPS Take by mouth as directed.    Historical Provider, MD  Cholestyramine POWD by Does not apply route as directed.    Historical Provider, MD  Coenzyme Q10 (CO Q-10) 100 MG CAPS Take by mouth as directed.    Historical Provider, MD  diphenhydrAMINE (ALLERGY RELIEF) 25 mg capsule Take 25 mg by mouth every 6 (six) hours as needed.    Historical Provider, MD  gabapentin (NEURONTIN) 100 MG capsule Take 100 mg by mouth 3 (three) times daily.    Historical Provider, MD  HYDROcodone-acetaminophen (NORCO/VICODIN) 5-325 MG tablet Take by mouth every 6 (six) hours as needed for moderate pain.    Historical Provider, MD  Insulin Glargine (TOUJEO SOLOSTAR [place]) Inject into the skin as directed.    Historical Provider, MD  Insulin Glulisine (APIDRA [place]) Inject into the skin as directed.    Historical Provider, MD  metolazone (ZAROXOLYN) 5 MG tablet Take 5 mg by mouth daily.    Historical Provider, MD  omeprazole (PRILOSEC) 20 MG capsule Take 20 mg by mouth  daily.    Historical Provider, MD  potassium chloride (KLOR-CON) 20 MEQ packet Take 20 mEq by mouth daily.    Historical Provider, MD  pravastatin (PRAVACHOL) 20 MG tablet Take 20 mg by mouth daily.    Historical Provider, MD  prednisoLONE 5 MG TABS tablet Take 5 mg by mouth as directed.    Historical Provider, MD  predniSONE (DELTASONE) 10 MG tablet Take 10 mg by mouth as directed.    Historical Provider, MD  promethazine (PHENERGAN) 25 MG tablet Take 25 mg by mouth every 6 (six) hours as needed for nausea or vomiting.    Historical Provider, MD  terazosin (HYTRIN) 2 MG capsule Take 2 mg by mouth at bedtime.    Historical Provider, MD    Family History Family History  Problem Relation Age of Onset  . CAD Mother   .  CAD Father     Social History Social History  Substance Use Topics  . Smoking status: Former Games developer  . Smokeless tobacco: Never Used  . Alcohol use No     Allergies   Ace inhibitors; Clindamycin/lincomycin; Erythromycin; Feldene [piroxicam]; Keflex [cephalexin]; Omnicef [cefdinir]; and Simvastatin   Review of Systems Review of Systems Ten systems are reviewed and are negative for acute change except as noted in the HPI   Physical Exam Updated Vital Signs BP 131/72   Pulse 93   Temp 98.2 F (36.8 C) (Oral)   Resp 20   Ht 5\' 7"  (1.702 m)   Wt 195 lb (88.5 kg)   SpO2 97%   BMI 30.54 kg/m   Physical Exam  Constitutional: He is oriented to person, place, and time. He appears well-developed and well-nourished. No distress.  HENT:  Head: Normocephalic and atraumatic.  Nose: Nose normal.  Eyes: Conjunctivae and EOM are normal. Pupils are equal, round, and reactive to light. Right eye exhibits no discharge. Left eye exhibits no discharge. No scleral icterus.  Neck: Normal range of motion. Neck supple.  Cardiovascular: Normal rate and regular rhythm.  Exam reveals no gallop and no friction rub.   No murmur heard. Pulmonary/Chest: Effort normal and breath sounds normal. No stridor. No respiratory distress. He has no rales.  Abdominal: Soft. He exhibits no distension. There is no tenderness.  Musculoskeletal: He exhibits no edema or tenderness.  Neurological: He is alert and oriented to person, place, and time.  Skin: Skin is warm and dry. No rash noted. He is not diaphoretic. No erythema.  Psychiatric: He has a normal mood and affect.  Vitals reviewed.    ED Treatments / Results  Labs (all labs ordered are listed, but only abnormal results are displayed) Labs Reviewed  BASIC METABOLIC PANEL - Abnormal; Notable for the following:       Result Value   Sodium 133 (*)    Potassium 2.8 (*)    Chloride 88 (*)    Glucose, Bld 241 (*)    BUN 51 (*)    Creatinine, Ser 2.08  (*)    GFR calc non Af Amer 27 (*)    GFR calc Af Amer 32 (*)    Anion gap 16 (*)    All other components within normal limits  CBC - Abnormal; Notable for the following:    WBC 15.0 (*)    Platelets 134 (*)    All other components within normal limits  BRAIN NATRIURETIC PEPTIDE - Abnormal; Notable for the following:    B Natriuretic Peptide 114.8 (*)    All other  components within normal limits  BLOOD GAS, VENOUS  URINALYSIS, ROUTINE W REFLEX MICROSCOPIC (NOT AT [facility])  I-STAT TROPOININ, ED  I-STAT CG4 LACTIC ACID, ED    EKG  EKG Interpretation  Date/Time:  Tuesday October 13 2016 12:09:28 EST Ventricular Rate:  100 PR Interval:    QRS Duration: 90 QT Interval:  352 QTC Calculation: 454 R Axis:   81 Text Interpretation:  Atrial fibrillation ST & T wave abnormality, consider inferior ischemia Abnormal ECG not Confirmed by [facility] MD, Eilee Schader ([id number]) on 10/13/2016 12:35:01 PM       Radiology Dg Chest 2 View  Result Date: 10/13/2016 CLINICAL DATA:  Chest pain, weakness EXAM: CHEST  2 VIEW COMPARISON:  None. FINDINGS: Calcified granuloma in the left lower lung. No focal consolidation. No pleural effusion or pneumothorax. The heart is mildly enlarged. Postsurgical changes related to prior CABG. Degenerative changes of the visualized thoracolumbar spine. Median sternotomy. IMPRESSION: No evidence of acute cardiopulmonary disease. Electronically Signed   By: Charline Bills M.D.   On: 10/13/2016 12:44    Procedures Procedures (including critical care time)  Medications Ordered in ED Medications  potassium chloride SA (K-DUR,KLOR-CON) CR tablet 40 mEq (40 mEq Oral Given 10/13/16 1545)     Initial Impression / Assessment and Plan / ED Course  I have reviewed the triage vital signs and the nursing notes.  Pertinent labs & imaging results that were available during my care of the patient were reviewed by me and considered in my medical decision making (see chart for  details).  Clinical Course     BMP notable for mild hyponatremia, and hypochloremia. Also notable for hypokalemia S likely secondary to Bumex use. Hyperglycemia with anion gap of 16 however this is likely due to hypo-chloremia as bicarbonate was within normal limits. However will obtain VBG and UA to rule out DKA. Patient does have leukocytosis however no source of infection. Possible viral process given generalized fatigue. However fatigue could also be secondary to hypokalemia.  EKG T wave inversions in inferior leads. Also notable for U wave. Initial troponin negative.  Given patient's symptoms of fatigue and nausea, with ischemic EKG changes that are unable to be compared to previous, feel patient requires admission for ACS rule out. Additionally patient is hypokalemic with EKG changes and will require admission for repletion.  Oral and IV potassium repletion given in the ED.  No evidence of volume overload on exam. BNP less than 200; doubt CHF exacerbation.  Appreciate hospitalist admission.  Final Clinical Impressions(s) / ED Diagnoses   Final diagnoses:  Fatigue, unspecified type  Nausea  Hypokalemia    Disposition: Admit  Condition: stable    Nira Conn, MD 10/13/16 1739

## 2016-10-13 NOTE — ED Triage Notes (Signed)
Per pt and family, Pt is coming from home after being told by Rheumatologist to come into the ED due to high Creatinine levels and Chest pain. Pt reports starting not to feel well three days ago and went into MD yesterday to be evaluated. Pt seems to be fatigued at this time with reports of chest tightness.

## 2016-10-13 NOTE — Progress Notes (Signed)
CRITICAL VALUE ALERT  Critical value received:  Troponin 0.03  Date of notification:  10/13/2016  Time of notification:  2142  Critical value read back:Yes.    Nurse who received alert:  Leanor Kail  MD notified (1st page):  Kirtland Bouchard Schorr  Time of first page:  2144  MD notified (2nd page):  Time of second page:  Responding MD:  awaiting  Time MD responded:  awaiting

## 2016-10-14 ENCOUNTER — Ambulatory Visit: Payer: Self-pay | Admitting: Cardiology

## 2016-10-14 ENCOUNTER — Observation Stay (HOSPITAL_BASED_OUTPATIENT_CLINIC_OR_DEPARTMENT_OTHER): Payer: Medicare Other

## 2016-10-14 ENCOUNTER — Ambulatory Visit: Payer: Medicare Other | Admitting: Cardiology

## 2016-10-14 DIAGNOSIS — I5022 Chronic systolic (congestive) heart failure: Secondary | ICD-10-CM | POA: Diagnosis not present

## 2016-10-14 DIAGNOSIS — M1990 Unspecified osteoarthritis, unspecified site: Secondary | ICD-10-CM

## 2016-10-14 DIAGNOSIS — E876 Hypokalemia: Secondary | ICD-10-CM | POA: Diagnosis not present

## 2016-10-14 DIAGNOSIS — R9431 Abnormal electrocardiogram [ECG] [EKG]: Secondary | ICD-10-CM | POA: Diagnosis not present

## 2016-10-14 DIAGNOSIS — N289 Disorder of kidney and ureter, unspecified: Secondary | ICD-10-CM | POA: Diagnosis not present

## 2016-10-14 DIAGNOSIS — G4733 Obstructive sleep apnea (adult) (pediatric): Secondary | ICD-10-CM | POA: Diagnosis not present

## 2016-10-14 DIAGNOSIS — I255 Ischemic cardiomyopathy: Secondary | ICD-10-CM | POA: Diagnosis not present

## 2016-10-14 DIAGNOSIS — T502X5A Adverse effect of carbonic-anhydrase inhibitors, benzothiadiazides and other diuretics, initial encounter: Secondary | ICD-10-CM | POA: Diagnosis not present

## 2016-10-14 DIAGNOSIS — N179 Acute kidney failure, unspecified: Secondary | ICD-10-CM | POA: Diagnosis not present

## 2016-10-14 LAB — RENAL FUNCTION PANEL
ANION GAP: 12 (ref 5–15)
Albumin: 3 g/dL — ABNORMAL LOW (ref 3.5–5.0)
BUN: 48 mg/dL — ABNORMAL HIGH (ref 6–20)
CALCIUM: 8.7 mg/dL — AB (ref 8.9–10.3)
CO2: 29 mmol/L (ref 22–32)
Chloride: 92 mmol/L — ABNORMAL LOW (ref 101–111)
Creatinine, Ser: 1.85 mg/dL — ABNORMAL HIGH (ref 0.61–1.24)
GFR calc Af Amer: 37 mL/min — ABNORMAL LOW (ref >60)
GFR calc non Af Amer: 32 mL/min — ABNORMAL LOW (ref >60)
GLUCOSE: 331 mg/dL — AB (ref 65–99)
Phosphorus: 3.4 mg/dL (ref 2.5–4.6)
Potassium: 3.4 mmol/L — ABNORMAL LOW (ref 3.5–5.1)
SODIUM: 133 mmol/L — AB (ref 135–145)

## 2016-10-14 LAB — CBC
HCT: 35.4 % — ABNORMAL LOW (ref 39.0–52.0)
Hemoglobin: 11.7 g/dL — ABNORMAL LOW (ref 13.0–17.0)
MCH: 26.7 pg (ref 26.0–34.0)
MCHC: 33.1 g/dL (ref 30.0–36.0)
MCV: 80.6 fL (ref 78.0–100.0)
PLATELETS: 128 10*3/uL — AB (ref 150–400)
RBC: 4.39 MIL/uL (ref 4.22–5.81)
RDW: 14.8 % (ref 11.5–15.5)
WBC: 12.9 10*3/uL — ABNORMAL HIGH (ref 4.0–10.5)

## 2016-10-14 LAB — GLUCOSE, CAPILLARY
GLUCOSE-CAPILLARY: 381 mg/dL — AB (ref 65–99)
Glucose-Capillary: 254 mg/dL — ABNORMAL HIGH (ref 65–99)
Glucose-Capillary: 376 mg/dL — ABNORMAL HIGH (ref 65–99)

## 2016-10-14 LAB — ECHOCARDIOGRAM COMPLETE
HEIGHTINCHES: 67 in
WEIGHTICAEL: 2884.8 [oz_av]

## 2016-10-14 LAB — T4, FREE: Free T4: 1.02 ng/dL (ref 0.61–1.12)

## 2016-10-14 LAB — HEMOGLOBIN A1C
Hgb A1c MFr Bld: 10.2 % — ABNORMAL HIGH (ref 4.8–5.6)
Mean Plasma Glucose: 246 mg/dL

## 2016-10-14 LAB — LIPID PANEL
CHOL/HDL RATIO: 4.3 ratio
Cholesterol: 137 mg/dL (ref 0–200)
HDL: 32 mg/dL — AB (ref >40)
LDL CALC: 71 mg/dL (ref 0–99)
Triglycerides: 169 mg/dL — ABNORMAL HIGH (ref <150)
VLDL: 34 mg/dL (ref 0–40)

## 2016-10-14 LAB — TSH: TSH: 0.547 u[IU]/mL (ref 0.350–4.500)

## 2016-10-14 LAB — TROPONIN I

## 2016-10-14 MED ORDER — BUMETANIDE 1 MG PO TABS: 1.0000 mg | ORAL_TABLET | Freq: Every day | ORAL | 0 refills | Status: DC

## 2016-10-14 MED ORDER — POTASSIUM CHLORIDE CRYS ER 10 MEQ PO TBCR: 20.0000 meq | EXTENDED_RELEASE_TABLET | Freq: Every day | ORAL | 0 refills | Status: DC

## 2016-10-14 MED ORDER — INSULIN DETEMIR 100 UNIT/ML ~~LOC~~ SOLN
25.0000 [IU] | Freq: Every day | SUBCUTANEOUS | Status: DC
  Administered 2016-10-14: 25 [IU] via SUBCUTANEOUS
  Filled 2016-10-14: qty 0.25

## 2016-10-14 MED ORDER — LIVING WELL WITH DIABETES BOOK
Freq: Once | Status: AC
  Administered 2016-10-14: 18:00:00
  Filled 2016-10-14: qty 1

## 2016-10-14 NOTE — Consult Note (Signed)
CARDIOLOGY CONSULT NOTE   Patient ID: Devin Heath MRN: [id number] DOB/AGE: 04-30-1929 82 y.o.  Admit date: 10/13/2016  Primary Physician   Pearla DubonnetGATES,ROBERT NEVILL, MD Primary Cardiologist   Dr. Jens Somrenshaw Reason for Consultation   Possible CHF and diuretics management  Requesting Physician  Dr. Susie CassetteAbrol  HPI: Devin Heath is a 82 y.o. male with a history of CAD s/p CABG, ICM, DM, COPD on oxygen at night, permanent atrial fibrillation not on anticontagion due to recurrent nose bleed, RA, HLD and CKD stage II-IV who sent by PCP/Rheumatologist due to elevated Scr of 2.2 and K of low potassium.   [place]. Strong hx of CAD. Established care with Dr. Jens Somrenshaw 10/07/16. No records still available for review. His rate was elevated and plan to arrange 24 hours holder and possible add digoxin for rate control. Echo is pending to assess LV function. Plan to add ARB vs hydralazine/nitrate based on renal function. He was reconvening from URI at that time.   IN ER, noted K of 2.8 and Scr o 2.08. BNP of 114. Hgb 10.2. CXR clear. WBC 15. Troponin 0.03-->0.03--><0.03. He was on bumex 1mg  BID, metolazone 5mg  BID and Kdur 10meq qd. His diuretics was hold on admission and supplemental potassium in given. K of 3.4 and Scr improved to 1.85 today. Seems baseline around 1.5 per note. LDL 71.  Daughter at bedside who used to work with Dr. Ulyess MortLaBauer. He is not compliant with sugar and salt intake. Waynetta SandyWight has been stable at home around 190lb. The patient denies chest pain, palpitations, shortness of breath, orthopnea, PND, dizziness, syncope,  abdominal pain, hematochezia, melena, lower extremity edema.  Past Medical History:  Diagnosis Date  . A-fib (HCC)   . Arthritis   . CAD (coronary artery disease)   . COPD (chronic obstructive pulmonary disease) (HCC)   . Diabetes (HCC)   . Former smoker   . Gout   . Hard of hearing   . Heart failure (HCC)   . Hyperlipidemia     . IBS (irritable bowel syndrome)   . Ischemic cardiomyopathy   . On home oxygen therapy    "2L q hs" (10/13/2016)  . OSA on CPAP   . Renal insufficiency   . Rheumatoid arthritis (HCC)   . RLS (restless legs syndrome)   . Vitamin D deficiency      Past Surgical History:  Procedure Laterality Date  . APPENDECTOMY    . CARDIAC SURGERY     BY PASS  . FINGER AMPUTATION Left    RING FINGER  . NECK SURGERY    . TONSILLECTOMY      Allergies  Allergen Reactions  . Ace Inhibitors Cough  . Ambrisentan   . Clindamycin/Lincomycin   . Erythromycin   . Feldene [Piroxicam]   . Keflex [Cephalexin]   . Omnicef [Cefdinir]   . Simvastatin     I have reviewed the patient's current medications . allopurinol  100 mg Oral Daily  . carvedilol  25 mg Oral BID WC  . cholestyramine  4 g Oral BID  . enoxaparin (LOVENOX) injection  30 mg Subcutaneous Q24H  . insulin aspart  0-9 Units Subcutaneous TID WC  . insulin aspart  10 Units Subcutaneous TID WC  . insulin detemir  25 Units Subcutaneous Daily  . living well with diabetes book   Does not apply Once  . mirtazapine  15 mg Oral QHS  . pantoprazole  40 mg Oral QHS  . pravastatin  20 mg Oral Daily  . predniSONE  15 mg Oral Q breakfast  . terazosin  2 mg Oral QHS  . tiotropium  18 mcg Inhalation Daily    acetaminophen, acetaminophen, albuterol, diphenhydrAMINE, loperamide, morphine injection, nitroGLYCERIN, ondansetron (ZOFRAN) IV  Prior to Admission medications   Medication Sig Start Date End Date Taking? Authorizing Provider  acetaminophen (TYLENOL) 500 MG tablet Take 500 mg by mouth every 6 (six) hours as needed.   Yes Historical Provider, MD  albuterol (PROVENTIL HFA;VENTOLIN HFA) 108 (90 Base) MCG/ACT inhaler Inhale 2 puffs into the lungs every 6 (six) hours as needed for wheezing or shortness of breath.   Yes Historical Provider, MD  allopurinol (ZYLOPRIM) 100 MG tablet Take 100 mg by mouth daily.   Yes Historical Provider, MD   bumetanide (BUMEX) 1 MG tablet Take 1 mg by mouth 2 (two) times daily.    Yes Historical Provider, MD  carvedilol (COREG) 25 MG tablet Take 25 mg by mouth 2 (two) times daily with a meal.   Yes Historical Provider, MD  cetirizine (ZYRTEC) 5 MG tablet Take 5 mg by mouth daily as needed for allergies.   Yes Historical Provider, MD  Cholecalciferol (VITAMIN D3) 1000 units CAPS Take 2,000 Units by mouth daily.    Yes Historical Provider, MD  Cholestyramine POWD 1 packet by Does not apply route 2 (two) times daily.    Yes Historical Provider, MD  Coenzyme Q10 (CO Q-10) 100 MG CAPS Take 100 mg by mouth daily.    Yes Historical Provider, MD  diphenhydrAMINE (ALLERGY RELIEF) 25 mg capsule Take 25 mg by mouth every 6 (six) hours as needed.   Yes Historical Provider, MD  Insulin Glargine (TOUJEO SOLOSTAR [place]) Inject 40 Units into the skin at bedtime.    Yes Historical Provider, MD  Insulin Glulisine (APIDRA [place]) Inject 14 Units into the skin 3 (three) times daily.    Yes Historical Provider, MD  loperamide (IMODIUM) 2 MG capsule Take 2 mg by mouth 4 (four) times daily as needed for diarrhea or loose stools.   Yes Historical Provider, MD  metolazone (ZAROXOLYN) 5 MG tablet Take 5 mg by mouth 2 (two) times daily.    Yes Historical Provider, MD  mirtazapine (REMERON) 15 MG tablet Take 15 mg by mouth at bedtime.   Yes Historical Provider, MD  omeprazole (PRILOSEC) 20 MG capsule Take 20 mg by mouth daily.   Yes Historical Provider, MD  potassium chloride (K-DUR,KLOR-CON) 10 MEQ tablet Take 10 mEq by mouth daily.   Yes Historical Provider, MD  predniSONE (DELTASONE) 10 MG tablet Take 15 mg by mouth daily with breakfast.   Yes Historical Provider, MD  promethazine (PHENERGAN) 25 MG tablet Take 25 mg by mouth every 6 (six) hours as needed for nausea or vomiting.   Yes Historical Provider, MD  terazosin (HYTRIN) 2 MG capsule Take 2 mg by mouth at bedtime.   Yes Historical Provider, MD  tiotropium (SPIRIVA) 18 MCG  inhalation capsule Place 18 mcg into inhaler and inhale daily.   Yes Historical Provider, MD  umeclidinium bromide (INCRUSE ELLIPTA) 62.5 MCG/INH AEPB Inhale 1 puff into the lungs daily.   Yes Historical Provider, MD  pravastatin (PRAVACHOL) 20 MG tablet Take 20 mg by mouth daily.    Historical Provider, MD     Social History   Social History  . Marital status: Married    Spouse name: JEAN  . Number of children: 3  . Years of education: COLLEGE  Occupational History  . RETIRED    Social History Main Topics  . Smoking status: Former Games developer  . Smokeless tobacco: Never Used  . Alcohol use No  . Drug use: No  . Sexual activity: Not on file   Other Topics Concern  . Not on file   Social History Narrative  . No narrative on file    Family Status  Relation Status  . Mother Deceased  . Father Deceased  . Maternal Grandmother Deceased  . Maternal Grandfather Deceased  . Paternal Grandmother Deceased  . Paternal Grandfather Deceased   Family History  Problem Relation Age of Onset  . CAD Mother   . CAD Father     ROS:  Full 14 point review of systems complete and found to be negative unless listed above.  Physical Exam: Blood pressure 128/64, pulse 76, temperature 98.3 F (36.8 C), temperature source Oral, resp. rate 18, height 5\' 7"  (1.702 m), weight 180 lb 4.8 oz (81.8 kg), SpO2 99 %.  General: Well developed, well nourished, male in no acute distress Head: Eyes PERRLA, No xanthomas. Normocephalic and atraumatic, oropharynx without edema or exudate.  Lungs: Resp regular and unlabored, CTA. Heart: RRR no s3, s4, or murmurs..   Neck: No carotid bruits. No lymphadenopathy.  No JVD. Abdomen: Bowel sounds present, abdomen soft and non-tender without masses or hernias noted. Msk:  No spine or cva tenderness. No weakness, no joint deformities or effusions. Extremities: No clubbing, cyanosis. Trace BL LE  edema. DP/PT/Radials 2+ and equal bilaterally. Neuro: Alert and  oriented X 3. No focal deficits noted. Psych:  Good affect, responds appropriately Skin: No rashes or lesions noted.  Labs:   Lab Results  Component Value Date   WBC 12.9 (H) 10/14/2016   HGB 11.7 (L) 10/14/2016   HCT 35.4 (L) 10/14/2016   MCV 80.6 10/14/2016   PLT 128 (L) 10/14/2016   No results for input(s): INR in the last 72 hours.  Recent Labs Lab 10/14/16 0221  NA 133*  K 3.4*  CL 92*  CO2 29  BUN 48*  CREATININE 1.85*  CALCIUM 8.7*  GLUCOSE 331*  ALBUMIN 3.0*   Magnesium  Date Value Ref Range Status  10/13/2016 2.0 1.7 - 2.4 mg/dL Final    Recent Labs  10/15/2016 2024 10/13/16 2254 10/14/16 0221  TROPONINI 0.03* 0.03* <0.03    Recent Labs  10/13/16 1236  TROPIPOC 0.01   No results found for: PROBNP Lab Results  Component Value Date   CHOL 137 10/14/2016   HDL 32 (L) 10/14/2016   LDLCALC 71 10/14/2016   TRIG 169 (H) 10/14/2016   No results found for: DDIMER No results found for: LIPASE, AMYLASE TSH  Date/Time Value Ref Range Status  10/14/2016 02:21 AM 0.547 0.350 - 4.500 uIU/mL Final    Comment:    Performed by a 3rd Generation assay with a functional sensitivity of <=0.01 uIU/mL.   No results found for: VITAMINB12, FOLATE, FERRITIN, TIBC, IRON, RETICCTPCT   Radiology:  Dg Chest 2 View  Result Date: 10/13/2016 CLINICAL DATA:  Chest pain, weakness EXAM: CHEST  2 VIEW COMPARISON:  None. FINDINGS: Calcified granuloma in the left lower lung. No focal consolidation. No pleural effusion or pneumothorax. The heart is mildly enlarged. Postsurgical changes related to prior CABG. Degenerative changes of the visualized thoracolumbar spine. Median sternotomy. IMPRESSION: No evidence of acute cardiopulmonary disease. Electronically Signed   By: 10/15/2016 M.D.   On: 10/13/2016 12:44    ASSESSMENT AND PLAN:  1. Chronic systolic CHF - He is not in acute heart failure. CXR and BNP is reassuring. No orthopnea, PND or dyspnea. Trace LE edema is  likely due to holding diurtics  and hydration. He also eats extra salt to diet. Advised to cut back. Try compression stocking if needed. Weight has been stable.  - Ok to discharge patient on Bumax 1mg  qd and kdur qd. Hold metolazone. BMET Monday. Monitor as schedule as outpatient. F/u in CHF clinic as schedule.   *Weigh yourself on the same scale at same time of day and keep a log. *Report weight gain of > 3 lbs in 1 day or 5 lbs over the course of a week and/or symptoms of excess fluid (shortness of breath, difficulty lying flat, swelling, poor appetite, abdominal fullness/bloating, etc) to your doctor immediately. *Avoid foods that are high in sodium (processed, pre-packaged/canned goods, fast foods, etc). He has been eating high. Advised to cut back.  *Please attend all scheduled and reccommended follow up appointments   2. Hypokalemia - Likely due to diuretics. Improving with supplement. K of 3.4 today.   3. ICM - Pending echo to assess LV function. Continue coreg. Plan to add ARB/ACE vs hydralazine/Imdur once stable Scr.   4. Permanent atrial fibrillation - Pending 24 hours holter. Rate has been stable her at 90-100s on coreg. Plan was to add digoxin if elevated rate.   5. CKD, stage IV - Scr improving with holding diuretics  6. Generalized weakness - Likely not due to CHF and Afib. Per primary.   SignedSunday, PA 10/14/2016, 3:54 PM Pager [phone]  Co-Sign MD  History and all data above reviewed.  Patient examined.  I agree with the findings as above.  The patient has had some mild intermittent chest heaviness without associated symptoms.  He has baseline SOB but no PND or orthopnea.  The patient denies any new symptoms such as chest discomfort, neck or arm discomfort. There has been no reported palpitations, presyncope or syncope.  The patient exam reveals COR:RRR  ,  Lungs: Decreased breath sounds without   ,  Abd: Positive bowel sounds, no rebound no  guarding, Ext No edema  .  All available labs, radiology testing, previous records reviewed. Agree with documented assessment and plan. CHF:  He came in with no evidence of volume overload and some suggestion of over diuresis.  Plan to send home on reduced Bumex at 1 mg daily.  He should increase his KDur to 20 meq daily.  Discussed with his daughter.  He needs to reduce his salt and continue daily weights and use PRN Zaroxolyn as he has been doing.  He will have follow up BMET on Monday and he will need to have TOC follow up next week in the office.  [facility] has [facility] already [facility].  I did review the echo with the final pending.     Monday Lilliana Turner  5:02 PM  10/14/2016

## 2016-10-14 NOTE — Progress Notes (Signed)
Pt has orders to be discharged. Discharge instructions given and pt has no additional questions at this time. Medication regimen reviewed and pt educated. Pt verbalized understanding and has no additional questions. Telemetry box removed. IV removed and site in good condition. Pt stable and waiting for transportation. 

## 2016-10-14 NOTE — Progress Notes (Addendum)
Inpatient Diabetes Program Recommendations  AACE/ADA: New Consensus Statement on Inpatient Glycemic Control (2015)  Target Ranges:  Prepandial:   less than 140 mg/dL      Peak postprandial:   less than 180 mg/dL (1-2 hours)      Critically ill patients:  140 - 180 mg/dL   Lab Results  Component Value Date   GLUCAP 381 (H) 10/14/2016   HGBA1C 10.2 (H) 10/13/2016    Review of Glycemic Control  Diabetes history: DM2 Outpatient Diabetes medications: Toujeo 40 units+Apidra 14 units tid Current orders for Inpatient glycemic control: Levemir 20+Nov 10 tid +0-9 tid  Inpatient Diabetes Program Recommendations:  Noted elevated CBGs.  Please consider: -Increase Levemir to 30 units daily -Increase meal coverage to Novolog 12 units tid if eats 50%  Spoke with pt and his wife about elevated A1C and reviewed what an A1C is, basic home care, basic diabetes diet nutrition principles, importance of checking CBGs and maintaining good CBG control to prevent long-term and short-term complications. Reviewed signs and symptoms of hyperglycemia and hypoglycemia and how to treat hypoglycemia at home. Also reviewed blood sugar goals at home.  RNs to provide ongoing basic DM education at bedside with this patient. Have ordered educational booklet  and DM videos.   Thank you, Devin Heath. Amilia Vandenbrink, RN, MSN, CDE Inpatient Glycemic Control Team Team Pager [phone] (8am-5pm) 10/14/2016 2:06 PM

## 2016-10-14 NOTE — Evaluation (Signed)
Physical Therapy Evaluation Patient Details Name: Devin Heath MRN: [id number] DOB: Jul 14, 1929 Today's Date: 10/14/2016   History of Present Illness  Pt is an 82 y/o male admitted secondary to weakness likely due to an electrolyte imbalance. PMH including but not limited to CAD, a-fib, COPD and DM.  Clinical Impression  Pt presented supine in bed with HOB elevated, awake and willing to participate in therapy session. Prior to admission, pt stated that he was mod I with all functional mobility with use of SPC to ambulate. Pt would continue to benefit from skilled physical therapy services at this time while admitted and after d/c to address his below listed limitations in order to improve his overall safety and independence with functional mobility.     Follow Up Recommendations Home health PT;Supervision for mobility/OOB    Equipment Recommendations  None recommended by PT    Recommendations for Other Services       Precautions / Restrictions Precautions Precautions: Fall Restrictions Weight Bearing Restrictions: No      Mobility  Bed Mobility Overal bed mobility: Needs Assistance Bed Mobility: Supine to Sit;Sit to Supine     Supine to sit: Supervision;HOB elevated Sit to supine: Supervision   General bed mobility comments: pt required increased time, supervision for safety  Transfers Overall transfer level: Needs assistance Equipment used: None Transfers: Sit to/from Stand Sit to Stand: Supervision         General transfer comment: pt using R UE on bed rail to steady himself, supervision for safety  Ambulation/Gait Ambulation/Gait assistance: Min guard Ambulation Distance (Feet): 100 Feet Assistive device: None Gait Pattern/deviations: Step-through pattern;Shuffle Gait velocity: decreased Gait velocity interpretation: <1.8 ft/sec, indicative of risk for recurrent falls General Gait Details: pt with mild instability during ambulation; however, no LOB. PT  provided min guard for safety  Stairs            Wheelchair Mobility    Modified Rankin (Stroke Patients Only)       Balance Overall balance assessment: Needs assistance Sitting-balance support: Feet supported;No upper extremity supported Sitting balance-Leahy Scale: Fair     Standing balance support: During functional activity;No upper extremity supported Standing balance-Leahy Scale: Fair                               Pertinent Vitals/Pain Pain Assessment: No/denies pain    Home Living Family/patient expects to be discharged to:: Private residence Living Arrangements: Spouse/significant other Available Help at Discharge: Family;Available PRN/intermittently Type of Home: House Home Access: Stairs to enter Entrance Stairs-Rails: None Entrance Stairs-Number of Steps: 1 Home Layout: One level Home Equipment: Cane - single point      Prior Function Level of Independence: Independent with assistive device(s)         Comments: pt reported that he ambulates with use of SPC     Hand Dominance        Extremity/Trunk Assessment   Upper Extremity Assessment: Overall WFL for tasks assessed           Lower Extremity Assessment: Generalized weakness         Communication   Communication: No difficulties  Cognition Arousal/Alertness: Awake/alert Behavior During Therapy: WFL for tasks assessed/performed Overall Cognitive Status: Within Functional Limits for tasks assessed                      General Comments      Exercises     Assessment/Plan  PT Assessment Patient needs continued PT services  PT Problem List Decreased strength;Decreased activity tolerance;Decreased balance;Decreased mobility;Decreased coordination;Decreased knowledge of use of DME;Decreased safety awareness          PT Treatment Interventions DME instruction;Gait training;Stair training;Functional mobility training;Therapeutic activities;Therapeutic  exercise;Balance training;Neuromuscular re-education;Patient/family education    PT Goals (Current goals can be found in the Care Plan section)  Acute Rehab PT Goals Patient Stated Goal: return home PT Goal Formulation: With patient Time For Goal Achievement: 10/28/16 Potential to Achieve Goals: Good    Frequency Min 2X/week   Barriers to discharge        Co-evaluation               End of Session Equipment Utilized During Treatment: Gait belt Activity Tolerance: Patient tolerated treatment well Patient left: in bed;with call bell/phone within reach Nurse Communication: Mobility status    Functional Assessment Tool Used: clinical judgement Functional Limitation: Mobility: Walking and moving around Mobility: Walking and Moving Around Current Status (H4742): At least 1 percent but less than 20 percent impaired, limited or restricted Mobility: Walking and Moving Around Goal Status [phone]): 0 percent impaired, limited or restricted    Time: 1257-1309 PT Time Calculation (min) (ACUTE ONLY): 12 min   Charges:   PT Evaluation $PT Eval Moderate Complexity: 1 Procedure     PT G Codes:   PT G-Codes **NOT FOR INPATIENT CLASS** Functional Assessment Tool Used: clinical judgement Functional Limitation: Mobility: Walking and moving around Mobility: Walking and Moving Around Current Status (O7564): At least 1 percent but less than 20 percent impaired, limited or restricted Mobility: Walking and Moving Around Goal Status [phone]): 0 percent impaired, limited or restricted    [facility] 10/14/2016, 3:46 PM Deborah Chalk, PT, DPT [phone]

## 2016-10-14 NOTE — Discharge Summary (Signed)
Physician Discharge Summary  Devin Heath MRN: [id number] DOB/AGE: 1929-08-07 82 y.o.  PCP: Henrine Screws, MD   Admit date: 10/13/2016 Discharge date: 10/14/2016  Discharge Diagnoses:    Principal Problem:   Hypokalemia Active Problems:   Acute on chronic renal insufficiency   OSA (obstructive sleep apnea)   Ischemic cardiomyopathy   IBS (irritable bowel syndrome)   Hyperlipidemia   Heart failure (HCC)   Diabetes (HCC)   COPD (chronic obstructive pulmonary disease) (HCC)   CAD (coronary artery disease)   Rheumatoid arthritis (HCC)   A-fib (HCC)   Chest pain with high risk for cardiac etiology   Abnormal EKG   Hyperglycemia   Leukocytosis    Follow-up recommendations Follow-up with PCP in 3-5 days , including all  additional recommended appointments as below Follow-up CBC, CMP in 3-5 days Patient is being discharged with home health PCP/cardiology to kindly follow-up on the results of 2-D echo Patient needs to follow-up with CHF and heart failure clinic-He has a [facility]t [facility] already scheduled Patient's insulin regimen may need further adjustment given her chronic use of steroids for better Glycemic control    Current Discharge Medication List    CONTINUE these medications which have CHANGED   Details  bumetanide (BUMEX) 1 MG tablet Take 1 tablet (1 mg total) by mouth daily. Qty: 30 tablet, Refills: 0    potassium chloride (K-DUR,KLOR-CON) 10 MEQ tablet Take 2 tablets (20 mEq total) by mouth daily. Qty: 60 tablet, Refills: 0      CONTINUE these medications which have NOT CHANGED   Details  acetaminophen (TYLENOL) 500 MG tablet Take 500 mg by mouth every 6 (six) hours as needed.    albuterol (PROVENTIL HFA;VENTOLIN HFA) 108 (90 Base) MCG/ACT inhaler Inhale 2 puffs into the lungs every 6 (six) hours as needed for wheezing or shortness of breath.    allopurinol (ZYLOPRIM) 100 MG tablet Take 100 mg by mouth daily.     carvedilol (COREG) 25 MG tablet Take 25 mg by mouth 2 (two) times daily with a meal.    cetirizine (ZYRTEC) 5 MG tablet Take 5 mg by mouth daily as needed for allergies.    Cholecalciferol (VITAMIN D3) 1000 units CAPS Take 2,000 Units by mouth daily.     Cholestyramine POWD 1 packet by Does not apply route 2 (two) times daily.     Coenzyme Q10 (CO Q-10) 100 MG CAPS Take 100 mg by mouth daily.     diphenhydrAMINE (ALLERGY RELIEF) 25 mg capsule Take 25 mg by mouth every 6 (six) hours as needed.    Insulin Glargine (TOUJEO SOLOSTAR [place]) Inject 40 Units into the skin at bedtime.     Insulin Glulisine (APIDRA [place]) Inject 14 Units into the skin 3 (three) times daily.     mirtazapine (REMERON) 15 MG tablet Take 15 mg by mouth at bedtime.    omeprazole (PRILOSEC) 20 MG capsule Take 20 mg by mouth daily.    predniSONE (DELTASONE) 10 MG tablet Take 15 mg by mouth daily with breakfast.    promethazine (PHENERGAN) 25 MG tablet Take 25 mg by mouth every 6 (six) hours as needed for nausea or vomiting.    terazosin (HYTRIN) 2 MG capsule Take 2 mg by mouth at bedtime.    tiotropium (SPIRIVA) 18 MCG inhalation capsule Place 18 mcg into inhaler and inhale daily.    umeclidinium bromide (INCRUSE ELLIPTA) 62.5 MCG/INH AEPB Inhale 1 puff into the lungs daily.    pravastatin (  PRAVACHOL) 20 MG tablet Take 20 mg by mouth daily.      STOP taking these medications     loperamide (IMODIUM) 2 MG capsule      metolazone (ZAROXOLYN) 5 MG tablet          Discharge Condition: Stable   Discharge Instructions Get Medicines reviewed and adjusted: Please take all your medications with you for your next visit with your Primary MD  Please request your Primary MD to go over all hospital tests and procedure/radiological results at the follow up, please ask your Primary MD to get all Hospital records sent to his/her office.  If you experience worsening of your admission symptoms, develop shortness of  breath, life threatening emergency, suicidal or homicidal thoughts you must seek medical attention immediately by calling 911 or calling your MD immediately if symptoms less severe.  You must read complete instructions/literature along with all the possible adverse reactions/side effects for all the Medicines you take and that have been prescribed to you. Take any new Medicines after you have completely understood and accpet all the possible adverse reactions/side effects.   Do not drive when taking Pain medications.   Do not take more than prescribed Pain, Sleep and Anxiety Medications  Special Instructions: If you have smoked or chewed Tobacco in the last 2 yrs please stop smoking, stop any regular Alcohol and or any Recreational drug use.  Wear Seat belts while driving.  Please note  You were cared for by a hospitalist during your hospital stay. Once you are discharged, your primary care physician will handle any further medical issues. Please note that NO REFILLS for any discharge medications will be authorized once you are discharged, as it is imperative that you return to your primary care physician (or establish a relationship with a primary care physician if you do not have one) for your aftercare needs so that they can reassess your need for medications and monitor your lab values.  Discharge Instructions    Diet - low sodium heart healthy    Complete by:  As directed    Increase activity slowly    Complete by:  As directed        Allergies  Allergen Reactions  . Ace Inhibitors Cough  . Ambrisentan   . Clindamycin/Lincomycin   . Erythromycin   . Feldene [Piroxicam]   . Keflex [Cephalexin]   . Omnicef [Cefdinir]   . Simvastatin       Disposition: Home with home health   Consults:   cardiology    Significant Diagnostic Studies:  Dg Chest 2 View  Result Date: 10/13/2016 CLINICAL DATA:  Chest pain, weakness EXAM: CHEST  2 VIEW COMPARISON:  None. FINDINGS:  Calcified granuloma in the left lower lung. No focal consolidation. No pleural effusion or pneumothorax. The heart is mildly enlarged. Postsurgical changes related to prior CABG. Degenerative changes of the visualized thoracolumbar spine. Median sternotomy. IMPRESSION: No evidence of acute cardiopulmonary disease. Electronically Signed   By: Julian Hy M.D.   On: 10/13/2016 12:44    2-D echo Results pending    Filed Weights   10/13/16 1213 10/13/16 2002 10/14/16 0614  Weight: 88.5 kg (195 lb) 82.5 kg (181 lb 14.4 oz) 81.8 kg (180 lb 4.8 oz)     Microbiology: No results found for this or any previous visit (from the past 240 hour(s)).     Blood Culture No results found for: SDES, SPECREQUEST, CULT, REPTSTATUS    Labs: Results for orders placed or  performed during the hospital encounter of 10/13/16 (from the past 48 hour(s))  Basic metabolic panel     Status: Abnormal   Collection Time: 10/13/16 12:20 PM  Result Value Ref Range   Sodium 133 (L) 135 - 145 mmol/L   Potassium 2.8 (L) 3.5 - 5.1 mmol/L   Chloride 88 (L) 101 - 111 mmol/L   CO2 29 22 - 32 mmol/L   Glucose, Bld 241 (H) 65 - 99 mg/dL   BUN 51 (H) 6 - 20 mg/dL   Creatinine, Ser 2.08 (H) 0.61 - 1.24 mg/dL   Calcium 9.4 8.9 - 10.3 mg/dL   GFR calc non Af Amer 27 (L) >60 mL/min   GFR calc Af Amer 32 (L) >60 mL/min    Comment: (NOTE) The eGFR has been calculated using the CKD EPI equation. This calculation has not been validated in all clinical situations. eGFR's persistently <60 mL/min signify possible Chronic Kidney Disease.    Anion gap 16 (H) 5 - 15  CBC     Status: Abnormal   Collection Time: 10/13/16 12:20 PM  Result Value Ref Range   WBC 15.0 (H) 4.0 - 10.5 K/uL   RBC 5.17 4.22 - 5.81 MIL/uL   Hemoglobin 13.9 13.0 - 17.0 g/dL   HCT 41.8 39.0 - 52.0 %   MCV 80.9 78.0 - 100.0 fL   MCH 26.9 26.0 - 34.0 pg   MCHC 33.3 30.0 - 36.0 g/dL   RDW 15.0 11.5 - 15.5 %   Platelets 134 (L) 150 - 400 K/uL   I-stat troponin, ED     Status: None   Collection Time: 10/13/16 12:36 PM  Result Value Ref Range   Troponin i, poc 0.01 0.00 - 0.08 ng/mL   Comment 3            Comment: Due to the release kinetics of cTnI, a negative result within the first hours of the onset of symptoms does not rule out myocardial infarction with certainty. If myocardial infarction is still suspected, repeat the test at appropriate intervals.   Brain natriuretic peptide     Status: Abnormal   Collection Time: 10/13/16  1:20 PM  Result Value Ref Range   B Natriuretic Peptide 114.8 (H) 0.0 - 100.0 pg/mL  Urinalysis, Routine w reflex microscopic (not at [facility])     Status: None   Collection Time: 10/13/16  4:14 PM  Result Value Ref Range   Color, Urine YELLOW YELLOW   APPearance CLEAR CLEAR   Specific Gravity, Urine 1.010 1.005 - 1.030   pH 6.5 5.0 - 8.0   Glucose, UA NEGATIVE NEGATIVE mg/dL   Hgb urine dipstick NEGATIVE NEGATIVE   Bilirubin Urine NEGATIVE NEGATIVE   Ketones, ur NEGATIVE NEGATIVE mg/dL   Protein, ur NEGATIVE NEGATIVE mg/dL   Nitrite NEGATIVE NEGATIVE   Leukocytes, UA NEGATIVE NEGATIVE    Comment: MICROSCOPIC NOT DONE ON URINES WITH NEGATIVE PROTEIN, BLOOD, LEUKOCYTES, NITRITE, OR GLUCOSE <1000 mg/dL.  I-Stat CG4 Lactic Acid, ED     Status: None   Collection Time: 10/13/16  4:23 PM  Result Value Ref Range   Lactic Acid, Venous 1.37 0.5 - 1.9 mmol/L  I-Stat CG4 Lactic Acid, ED     Status: None   Collection Time: 10/13/16  4:24 PM  Result Value Ref Range   Lactic Acid, Venous 1.39 0.5 - 1.9 mmol/L  Troponin I-serum (0, 3, 6 hours)     Status: Abnormal   Collection Time: 10/13/16  8:24 PM  Result  Value Ref Range   Troponin I 0.03 (HH) <0.03 ng/mL    Comment: CRITICAL RESULT CALLED TO, READ BACK BY AND VERIFIED WITH: A. BUENDIA RN [phone] R   Hemoglobin A1c     Status: Abnormal   Collection Time: 10/13/16  8:24 PM  Result Value Ref Range   Hgb A1c MFr Bld 10.2 (H) 4.8 - 5.6 %     Comment: (NOTE)         Pre-diabetes: 5.7 - 6.4         Diabetes: >6.4         Glycemic control for adults with diabetes: <7.0    Mean Plasma Glucose 246 mg/dL    Comment: (NOTE) Performed At: [facility] [address] [place], [place] [id number] Lindon Romp MD [id number]   Magnesium     Status: None   Collection Time: 10/13/16  8:24 PM  Result Value Ref Range   Magnesium 2.0 1.7 - 2.4 mg/dL  Glucose, capillary     Status: Abnormal   Collection Time: 10/13/16  9:05 PM  Result Value Ref Range   Glucose-Capillary 357 (H) 65 - 99 mg/dL   Comment 1 Notify RN    Comment 2 Document in Chart   Troponin I-serum (0, 3, 6 hours)     Status: Abnormal   Collection Time: 10/13/16 10:54 PM  Result Value Ref Range   Troponin I 0.03 (HH) <0.03 ng/mL    Comment: CRITICAL VALUE NOTED.  VALUE IS CONSISTENT WITH PREVIOUSLY REPORTED AND CALLED VALUE.  Troponin I-serum (0, 3, 6 hours)     Status: None   Collection Time: 10/14/16  2:21 AM  Result Value Ref Range   Troponin I <0.03 <0.03 ng/mL  TSH     Status: None   Collection Time: 10/14/16  2:21 AM  Result Value Ref Range   TSH 0.547 0.350 - 4.500 uIU/mL    Comment: Performed by a 3rd Generation assay with a functional sensitivity of <=0.01 uIU/mL.  CBC     Status: Abnormal   Collection Time: 10/14/16  2:21 AM  Result Value Ref Range   WBC 12.9 (H) 4.0 - 10.5 K/uL   RBC 4.39 4.22 - 5.81 MIL/uL   Hemoglobin 11.7 (L) 13.0 - 17.0 g/dL   HCT 35.4 (L) 39.0 - 52.0 %   MCV 80.6 78.0 - 100.0 fL   MCH 26.7 26.0 - 34.0 pg   MCHC 33.1 30.0 - 36.0 g/dL   RDW 14.8 11.5 - 15.5 %   Platelets 128 (L) 150 - 400 K/uL  Lipid panel     Status: Abnormal   Collection Time: 10/14/16  2:21 AM  Result Value Ref Range   Cholesterol 137 0 - 200 mg/dL   Triglycerides 169 (H) <150 mg/dL   HDL 32 (L) >40 mg/dL   Total CHOL/HDL Ratio 4.3 RATIO   VLDL 34 0 - 40 mg/dL   LDL Cholesterol 71 0 - 99 mg/dL    Comment:        Total  Cholesterol/HDL:CHD Risk Coronary Heart Disease Risk Table                     Men   Women  1/2 Average Risk   3.4   3.3  Average Risk       5.0   4.4  2 X Average Risk   9.6   7.1  3 X Average Risk  23.4   11.0  Use the calculated Patient Ratio above and the CHD Risk Table to determine the patient's CHD Risk.        ATP III CLASSIFICATION (LDL):  <100     mg/dL   Optimal  100-129  mg/dL   Near or Above                    Optimal  130-159  mg/dL   Borderline  160-189  mg/dL   High  >190     mg/dL   Very High   Renal function panel     Status: Abnormal   Collection Time: 10/14/16  2:21 AM  Result Value Ref Range   Sodium 133 (L) 135 - 145 mmol/L   Potassium 3.4 (L) 3.5 - 5.1 mmol/L    Comment: DELTA CHECK NOTED   Chloride 92 (L) 101 - 111 mmol/L   CO2 29 22 - 32 mmol/L   Glucose, Bld 331 (H) 65 - 99 mg/dL   BUN 48 (H) 6 - 20 mg/dL   Creatinine, Ser 1.85 (H) 0.61 - 1.24 mg/dL   Calcium 8.7 (L) 8.9 - 10.3 mg/dL   Phosphorus 3.4 2.5 - 4.6 mg/dL   Albumin 3.0 (L) 3.5 - 5.0 g/dL   GFR calc non Af Amer 32 (L) >60 mL/min   GFR calc Af Amer 37 (L) >60 mL/min    Comment: (NOTE) The eGFR has been calculated using the CKD EPI equation. This calculation has not been validated in all clinical situations. eGFR's persistently <60 mL/min signify possible Chronic Kidney Disease.    Anion gap 12 5 - 15  T4, free     Status: None   Collection Time: 10/14/16  2:21 AM  Result Value Ref Range   Free T4 1.02 0.61 - 1.12 ng/dL    Comment: (NOTE) Biotin ingestion may interfere with free T4 tests. If the results are inconsistent with the TSH level, previous test results, or the clinical presentation, then consider biotin interference. If needed, order repeat testing after stopping biotin.   Glucose, capillary     Status: Abnormal   Collection Time: 10/14/16  6:03 AM  Result Value Ref Range   Glucose-Capillary 254 (H) 65 - 99 mg/dL  Glucose, capillary     Status: Abnormal    Collection Time: 10/14/16 11:59 AM  Result Value Ref Range   Glucose-Capillary 381 (H) 65 - 99 mg/dL   Comment 1 Notify RN   Glucose, capillary     Status: Abnormal   Collection Time: 10/14/16  4:39 PM  Result Value Ref Range   Glucose-Capillary 376 (H) 65 - 99 mg/dL   Comment 1 Notify RN      Lipid Panel     Component Value Date/Time   CHOL 137 10/14/2016 0221   TRIG 169 (H) 10/14/2016 0221   HDL 32 (L) 10/14/2016 0221   CHOLHDL 4.3 10/14/2016 0221   VLDL 34 10/14/2016 0221   LDLCALC 71 10/14/2016 0221     Lab Results  Component Value Date   HGBA1C 10.2 (H) 10/13/2016        HPI :   82 y.o. male with a history of CAD s/p CABG, ICM, DM, COPD on oxygen at night, permanent atrial fibrillation not on anticontagion due to recurrent nose bleed, RA, HLD and CKD stage II-IV who sent by PCP/Rheumatologist due to elevated Scr of 2.2 and K of low potassium.   Recently relocated to [place] from [place], [place]. Strong hx of CAD. Established care with  Dr. Stanford Breed 10/07/16. No records still available for review. His rate was elevated and plan to arrange 24 hours holder and possible add digoxin for rate control. Echo is pending to assess LV function. Plan to add ARB vs hydralazine/nitrate based on renal function. He was reconvening from URI at that time.   IN ER, noted K of 2.8 and Scr o 2.08. BNP of 114. Hgb 10.2. CXR clear. WBC 15. Troponin 0.03-->0.03--><0.03. He was on bumex 31m BID, metolazone 557mBID and Kdur 1062mqd. His diuretics was hold on admission and supplemental potassium in given. K of 3.4 and Scr improved to 1.85 today. Seems baseline around 1.5 per note. LDL 71.  Daughter at bedside who used to work with Dr. LaBSmitty Plucke is not compliant with sugar and salt intake. WigLamar Beness been stable at home around 190lb. The patient denies chest pain, palpitations, shortness of breath, orthopnea, PND, dizziness, syncope,  abdominal pain, hematochezia, melena, lower extremity  edema.   HOSPITAL COURSE:   Hypokalemia from diuretic use- He has been taking bumex 1 mg BID and taking KCl 10 meq daily. Family reports that he is very compliant with taking his medications.  held bumex briefly while he is gently hydrated and potassium is corrected. Magnesium within normal limits.    Chronic systolic heart failure- He recently saw Dr. CreStanford Breedd echo was ordered , results still pending. Consulted cardiology.  cardiac enzymes flat, telemetry shows A. fib,. He is not in acute heart failure. CXR and BNP is reassuring. No orthopnea, PND or dyspnea. Trace LE edema is likely due to holding diurtics  and hydration. He also eats extra salt to diet. Advised to cut back. Try compression stocking if needed. Weight has been stable. - Ok to discharge patient on Bumax 1mg35m and kdur 20 meq qd. Hold metolazone. BMET Monday. Monitor as schedule as outpatient. F/u in CHF clinic as schedule. OK to discharge from a cardiology standpoint as per Dr.  JameMinus Breeding  Generalized Weakness- likely related to electrolyte abnormalities/acute on chronic kidney injury   and dehydration, no hypoglycemia  physical therapy recommending home health.Fall precautions.  Chest Pain with high risk for cardiac etiology- cardiac enzymes flat, and given that his full cardiac history is unknown, cardiology consulted.   Leukocytosis- repeat CBC/diff in AM. No s/s of infection found. He has been taking prednisone daily for RA and gout.   Abnormal EKG- Plan to monitor on telemetry, repeat EKG in AM after repletion of electrolytes.   Acute on chronic Renal Failure stage 3/4- Unsure what his baseline renal function is but ED doctor reported that patient's rheumatologist said that there was a recent Cr of 1.4 but was not able to confirm that.  creatinine improved from 2.08>1.85  Chronic atrial Fibrillation- Pt unable to take any anticoagulation because of severe bleeding in the past. He cannot even  tolerate aspirin because he is so sensitive to it he bleeds severely (reported). 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 Resumed all home respiratory medications as ordered.  Hyperlipidemia- triglycerides 169, HDL 32, LDL 71. Continue home pravastatin.   OSA- nightly CPAP ordered.   Rheumatoid Arthritis- resume prednisone daily. He takes 15 mg.   Gout- resume home  allopurinol daily.   IBS- He takes anti-diarrheal meds as needed and cholestyramine powder BID which will be continued.     Discharge Exam:   Blood pressure 128/64, pulse 76, temperature 98.3 F (36.8 C), temperature source Oral, resp. rate 18, height 5' 7" (1.702 m), weight 81.8 kg (180 lb 4.8 oz), SpO2 99 %.      Follow-up Information    GATES,ROBERT NEVILL, MD. Schedule an appointment as soon as possible for a visit.   Specialty:  Internal Medicine Why:  Hospital follow-up Contact information: [address] [place] [address] [phone]           Signed: Reyne Dumas 10/14/2016, 5:36 PM        Time spent >45 mins

## 2016-10-14 NOTE — Progress Notes (Signed)
[facility] PROGRESS NOTE  Devin Heath FBP:[id number] DOB: Sep 22, 1929 DOA: 10/13/2016   PCP: [name], MD     Assessment/Plan: Principal Problem:   Hypokalemia Active Problems:   Acute on chronic renal insufficiency   OSA (obstructive sleep apnea)   Ischemic cardiomyopathy   IBS (irritable bowel syndrome)   Hyperlipidemia   Heart failure (HCC)   Diabetes (HCC)   COPD (chronic obstructive pulmonary disease) (HCC)   CAD (coronary artery disease)   Rheumatoid arthritis (HCC)   A-fib (HCC)   Chest pain with high risk for cardiac etiology   Abnormal EKG   Hyperglycemia   Leukocytosis   Devin Heath is a 82 y.o. male (recently relocated to this area from [place]) with extensive CAD status post CABG, CHF, COPD, chronic AFib not on anticoagulation, CKD Stage 3/4 who presented to the ED at the request of primary care provider over concerns about complaints of generalized weakness and chest pressure.He does have a significant cardiac history and family reports that his last known EF was near 20%.  He has an appointment to establish care with advanced heart failure clinic with Dr. Shirlee Latch.   Assessment and plan  Hypokalemia from diuretic use - He has been taking bumex 1 mg BID and taking KCl 10 meq daily.  Family reports that he is very compliant with taking his medications.   held bumex briefly while he is gently hydrated and potassium is corrected. Magnesium within normal limits.  Monitor on continuous telemetry and recheck BMP in AM.   Ischemic cardiomyopathy - He recently saw Dr. Jens Som and echo was ordered for 12/8.  Consulted cardiology.   cardiac enzymes flat, telemetry shows A. fib,.   2-D echo    Generalized Weakness - likely related to electrolyte abnormalities/acute on chronic kidney injury   and dehydration, no hypoglycemia noted but he is at higher risk given that he takes Toujeo which is a longer active basal insulin and he has acute  renal failure.    PT eval ordered.  Fall precautions.  Telemetry monitoring.    Chest Pain with high risk for cardiac etiology - cardiac enzymes flat, and given that his full cardiac history is unknown, cardiology consulted.    Leukocytosis - repeat CBC/diff in AM.  No s/s of infection found.  He has been taking prednisone daily for RA and gout.    Abnormal EKG - Plan to monitor on telemetry, repeat EKG in AM after repletion of electrolytes.   Acute on chronic Renal Failure stage 3/4 - Unsure what his baseline renal function is but ED doctor reported that patient's rheumatologist said that there was a recent Cr of 1.4 but was not able to confirm that.   creatinine improved from 2.08>1.85  Chronic atrial Fibrillation - Pt unable to take any anticoagulation because of severe bleeding in the past.  He cannot even tolerate aspirin because he is so sensitive to it he bleeds severely (reported).   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 Resume all home respiratory medications as ordered.  Hyperlipidemia - triglycerides 169, HDL 32, LDL 71. Continue home pravastatin.   OSA - nightly CPAP ordered.   Rheumatoid Arthritis - resume prednisone daily.  He takes 15 mg.    Gout - resume home allopurinol daily.  IBS - He takes anti-diarrheal meds as needed and cholestyramine powder BID which will be continued.        DVT prophylaxsis Lovenox  Code Status:  Full code    Family Communication: Discussed in detail with the patient and wife, all imaging results, lab results explained to the patient   Disposition Plan:  1-2 days      Consultants:  Cardiology  Procedures:  None  Antibiotics: Anti-infectives    None         HPI/Subjective: Complains of generalized weakness,  denies any active chest pain shortness of breath  Objective: Vitals:   10/13/16 1915 10/13/16 2002 10/13/16 2350 10/14/16 0614  BP: 131/61 116/70 (!) 101/54 137/77  Pulse: 114 94 82 80  Resp: 16 18 18 18   Temp:  98.1 F (36.7 C) 98.6 F (37 C) 98.5 F (36.9 C)  TempSrc:  Oral Oral Oral  SpO2: 96% 99% 99% 99%  Weight:  82.5 kg (181 lb 14.4 oz)  81.8 kg (180 lb 4.8 oz)  Height:  5\' 7"  (1.702 m)      Intake/Output Summary (Last 24 hours) at 10/14/16 Last data filed at 10/14/16 4128  Gross per 24 hour  Intake                0 ml  Output              480 ml  Net             -480 ml    Exam:  Examination:  General exam: Appears calm and comfortable  Respiratory system: Clear to auscultation. Respiratory effort normal. Cardiovascular system: S1 & S2 heard, RRR. No JVD, murmurs, rubs, gallops or clicks. No pedal edema. Gastrointestinal system: Abdomen is nondistended, soft and nontender. No organomegaly or masses felt. Normal bowel sounds heard. Central nervous system: Alert and oriented. No focal neurological deficits. Extremities: Symmetric 5 x 5 power. Skin: No rashes, lesions or ulcers Psychiatry: Judgement and insight appear normal. Mood & affect appropriate.     Data Reviewed: I have personally reviewed following labs and imaging studies  Micro Results No results found for this or any previous visit (from the past 240 hour(s)).  Radiology Reports Dg Chest 2 View  Result Date: 10/13/2016 CLINICAL DATA:  Chest pain, weakness EXAM: CHEST  2 VIEW COMPARISON:  None. FINDINGS: Calcified granuloma in the left lower lung. No focal consolidation. No pleural effusion or pneumothorax. The heart is mildly enlarged. Postsurgical changes related to prior CABG. Degenerative changes of the visualized thoracolumbar spine. Median sternotomy. IMPRESSION: No evidence of acute cardiopulmonary disease. Electronically Signed   By: [id number] M.D.   On: 10/13/2016 12:44      CBC  Recent Labs Lab 10/13/16 1220 10/14/16 0221  WBC 15.0* 12.9*  HGB 13.9 11.7*  HCT 41.8 35.4*  PLT 134* 128*  MCV 80.9 80.6  MCH 26.9 26.7  MCHC 33.3 33.1  RDW 15.0 14.8    Chemistries   Recent Labs Lab 10/13/16 1220 10/13/16 2024 10/14/16 0221  NA 133*  --  133*  K 2.8*  --  3.4*  CL 88*  --  92*  CO2 29  --  29  GLUCOSE 241*  --  331*  BUN 51*  --  48*  CREATININE 2.08*  --  1.85*  CALCIUM 9.4  --  8.7*  MG  --  2.0  --    ------------------------------------------------------------------------------------------------------------------ estimated creatinine clearance is 29.9 mL/min (by C-G formula  based on SCr of 1.85 mg/dL (H)). ------------------------------------------------------------------------------------------------------------------  Recent Labs  10/13/16 2024  HGBA1C 10.2*   ------------------------------------------------------------------------------------------------------------------  Recent Labs  10/14/16 0221  CHOL 137  HDL 32*  LDLCALC 71  TRIG 314*  CHOLHDL 4.3   ------------------------------------------------------------------------------------------------------------------  Recent Labs  10/14/16 0221  TSH 0.547   ------------------------------------------------------------------------------------------------------------------ No results for input(s): VITAMINB12, FOLATE, FERRITIN, TIBC, IRON, RETICCTPCT in the last 72 hours.  Coagulation profile No results for input(s): INR, PROTIME in the last 168 hours.  No results for input(s): DDIMER in the last 72 hours.  Cardiac Enzymes  Recent Labs Lab 10/13/16 2024 10/13/16 2254 10/14/16 0221  TROPONINI 0.03* 0.03* <0.03   ------------------------------------------------------------------------------------------------------------------ Invalid input(s): POCBNP   CBG:  Recent Labs Lab 10/13/16 2105 10/14/16 0603  GLUCAP 357* 254*       Studies: Dg Chest 2  View  Result Date: 10/13/2016 CLINICAL DATA:  Chest pain, weakness EXAM: CHEST  2 VIEW COMPARISON:  None. FINDINGS: Calcified granuloma in the left lower lung. No focal consolidation. No pleural effusion or pneumothorax. The heart is mildly enlarged. Postsurgical changes related to prior CABG. Degenerative changes of the visualized thoracolumbar spine. Median sternotomy. IMPRESSION: No evidence of acute cardiopulmonary disease. Electronically Signed   By: Charline Bills M.D.   On: 10/13/2016 12:44      Lab Results  Component Value Date   HGBA1C 10.2 (H) 10/13/2016   Lab Results  Component Value Date   LDLCALC 71 10/14/2016   CREATININE 1.85 (H) 10/14/2016       Scheduled Meds: . allopurinol  100 mg Oral Daily  . carvedilol  25 mg Oral BID WC  . cholestyramine  4 g Oral BID  . enoxaparin (LOVENOX) injection  30 mg Subcutaneous Q24H  . insulin aspart  0-9 Units Subcutaneous TID WC  . insulin aspart  10 Units Subcutaneous TID WC  . mirtazapine  15 mg Oral QHS  . pantoprazole  40 mg Oral QHS  . pravastatin  20 mg Oral Daily  . predniSONE  15 mg Oral Q breakfast  . terazosin  2 mg Oral QHS  . tiotropium  18 mcg Inhalation Daily   Continuous Infusions:   LOS: 0 days    Time spent: >30 MINS    [facility]  [facility] Pager [phone]. If 7PM-7AM, please contact night-coverage at [email or web address], password [facility] 10/14/2016, 8:08 AM  LOS: 0 days

## 2016-10-14 NOTE — Progress Notes (Signed)
Pt ambulated in hallway. Gait steady with walker. Patient did not experience any shortness of breath. Heart rate maintained.

## 2016-10-14 NOTE — Progress Notes (Signed)
  Echocardiogram 2D Echocardiogram has been performed.  Devin Heath 10/14/2016, 4:34 PM

## 2016-10-14 NOTE — Progress Notes (Signed)
Visited with Devin Heath per daughter's request. Provided prayer, spiritual and  emotional support. Patient is a recent retired Optician, dispensing from [employer] . Wife at bedside.  I will pass on to unit Chaplain  For f/u visit.    10/14/16 0900  Clinical Encounter Type  Visited With Patient and family together;Health care provider  Visit Type Initial;Spiritual support  Referral From Family  Spiritual Encounters  Spiritual Needs Prayer;Emotional  Stress Factors  Patient Stress Factors None identified  Family Stress Factors None identified  Eshan Trupiano, [address]

## 2016-10-19 ENCOUNTER — Telehealth: Payer: Self-pay

## 2016-10-19 DIAGNOSIS — I509 Heart failure, unspecified: Secondary | ICD-10-CM

## 2016-10-19 NOTE — Telephone Encounter (Signed)
Received call from patient's daughter n law Lucille Passy.She stated father n law recently in hospital.He was told to have a bmet today.Stated she was calling to get bmet order.Stated she will be taking him to [facility] in [place].Bmet ordered.

## 2016-10-20 LAB — BASIC METABOLIC PANEL
BUN: 30 mg/dL — ABNORMAL HIGH (ref 7–25)
CALCIUM: 9 mg/dL (ref 8.6–10.3)
CO2: 30 mmol/L (ref 20–31)
CREATININE: 1.83 mg/dL — AB (ref 0.70–1.11)
Chloride: 94 mmol/L — ABNORMAL LOW (ref 98–110)
GLUCOSE: 255 mg/dL — AB (ref 65–99)
Potassium: 4.5 mmol/L (ref 3.5–5.3)
Sodium: 137 mmol/L (ref 135–146)

## 2016-10-30 ENCOUNTER — Other Ambulatory Visit (HOSPITAL_COMMUNITY): Payer: Medicare Other

## 2016-10-30 ENCOUNTER — Ambulatory Visit (INDEPENDENT_AMBULATORY_CARE_PROVIDER_SITE_OTHER): Payer: Medicare Other

## 2016-10-30 DIAGNOSIS — I4891 Unspecified atrial fibrillation: Secondary | ICD-10-CM | POA: Diagnosis not present

## 2016-11-02 ENCOUNTER — Encounter (HOSPITAL_COMMUNITY): Payer: Self-pay

## 2016-11-09 ENCOUNTER — Encounter (HOSPITAL_COMMUNITY): Payer: Self-pay

## 2016-11-09 ENCOUNTER — Ambulatory Visit (HOSPITAL_COMMUNITY)
Admission: RE | Admit: 2016-11-09 | Discharge: 2016-11-09 | Disposition: A | Discharge status: Home / Self Care | Payer: Medicare Other | Source: Ambulatory Visit | Attending: Cardiology | Admitting: Cardiology

## 2016-11-09 VITALS — BP 130/70 | HR 105 | Wt 189.0 lb

## 2016-11-09 DIAGNOSIS — M109 Gout, unspecified: Secondary | ICD-10-CM | POA: Insufficient documentation

## 2016-11-09 DIAGNOSIS — I482 Chronic atrial fibrillation: Secondary | ICD-10-CM | POA: Diagnosis not present

## 2016-11-09 DIAGNOSIS — Z87891 Personal history of nicotine dependence: Secondary | ICD-10-CM | POA: Diagnosis not present

## 2016-11-09 DIAGNOSIS — J449 Chronic obstructive pulmonary disease, unspecified: Secondary | ICD-10-CM | POA: Diagnosis not present

## 2016-11-09 DIAGNOSIS — I252 Old myocardial infarction: Secondary | ICD-10-CM | POA: Insufficient documentation

## 2016-11-09 DIAGNOSIS — Z8249 Family history of ischemic heart disease and other diseases of the circulatory system: Secondary | ICD-10-CM | POA: Diagnosis not present

## 2016-11-09 DIAGNOSIS — I4891 Unspecified atrial fibrillation: Secondary | ICD-10-CM

## 2016-11-09 DIAGNOSIS — Z9981 Dependence on supplemental oxygen: Secondary | ICD-10-CM | POA: Diagnosis not present

## 2016-11-09 DIAGNOSIS — K589 Irritable bowel syndrome without diarrhea: Secondary | ICD-10-CM | POA: Diagnosis not present

## 2016-11-09 DIAGNOSIS — E785 Hyperlipidemia, unspecified: Secondary | ICD-10-CM | POA: Diagnosis not present

## 2016-11-09 DIAGNOSIS — G4733 Obstructive sleep apnea (adult) (pediatric): Secondary | ICD-10-CM | POA: Insufficient documentation

## 2016-11-09 DIAGNOSIS — I5032 Chronic diastolic (congestive) heart failure: Secondary | ICD-10-CM | POA: Insufficient documentation

## 2016-11-09 DIAGNOSIS — Z951 Presence of aortocoronary bypass graft: Secondary | ICD-10-CM | POA: Insufficient documentation

## 2016-11-09 DIAGNOSIS — Z7901 Long term (current) use of anticoagulants: Secondary | ICD-10-CM | POA: Diagnosis not present

## 2016-11-09 DIAGNOSIS — N184 Chronic kidney disease, stage 4 (severe): Secondary | ICD-10-CM | POA: Diagnosis not present

## 2016-11-09 DIAGNOSIS — I509 Heart failure, unspecified: Secondary | ICD-10-CM | POA: Diagnosis not present

## 2016-11-09 DIAGNOSIS — I2510 Atherosclerotic heart disease of native coronary artery without angina pectoris: Secondary | ICD-10-CM | POA: Diagnosis not present

## 2016-11-09 DIAGNOSIS — E1122 Type 2 diabetes mellitus with diabetic chronic kidney disease: Secondary | ICD-10-CM | POA: Diagnosis not present

## 2016-11-09 DIAGNOSIS — Z794 Long term (current) use of insulin: Secondary | ICD-10-CM | POA: Insufficient documentation

## 2016-11-09 LAB — BASIC METABOLIC PANEL
ANION GAP: 13 (ref 5–15)
BUN: 30 mg/dL — AB (ref 6–20)
CHLORIDE: 99 mmol/L — AB (ref 101–111)
CO2: 25 mmol/L (ref 22–32)
Calcium: 9.1 mg/dL (ref 8.9–10.3)
Creatinine, Ser: 1.76 mg/dL — ABNORMAL HIGH (ref 0.61–1.24)
GFR calc Af Amer: 39 mL/min — ABNORMAL LOW (ref >60)
GFR calc non Af Amer: 34 mL/min — ABNORMAL LOW (ref >60)
GLUCOSE: 290 mg/dL — AB (ref 65–99)
POTASSIUM: 4.2 mmol/L (ref 3.5–5.1)
Sodium: 137 mmol/L (ref 135–145)

## 2016-11-09 LAB — BRAIN NATRIURETIC PEPTIDE: B NATRIURETIC PEPTIDE 5: 79.9 pg/mL (ref 0.0–100.0)

## 2016-11-09 MED ORDER — APIXABAN 2.5 MG PO TABS: 2.5000 mg | ORAL_TABLET | Freq: Two times a day (BID) | ORAL | 3 refills | Status: DC

## 2016-11-09 MED ORDER — TORSEMIDE 20 MG PO TABS: ORAL_TABLET | ORAL | 3 refills | Status: DC

## 2016-11-09 NOTE — Patient Instructions (Addendum)
Postpone your appointment 3 months with Dr. Jens Som per Dr. Alford Highland recommendation.  STOP Bumex.  START Torsemide 40 mg (2 tabs) in am and 20 mg (1 tab) in pm.  START Eliquis 2.5 mg tablet once daily.  Use saline nasal spray twice daily as needed (over the counter).  Routine lab work today. Will notify you of abnormal results, otherwise no news is good news!  Repeat lab work in 10 days.  Follow up with Dr. Shirlee Latch in 3 weeks.  Do the following things EVERYDAY: 1) Weigh yourself in the morning before breakfast. Write it down and keep it in a log. 2) Take your medicines as prescribed 3) Eat low salt foods-Limit salt (sodium) to 2000 mg per day.  4) Stay as active as you can everyday 5) Limit all fluids for the day to less than 2 liters

## 2016-11-10 NOTE — Progress Notes (Signed)
PCP: Dr. Jerelyn Scott Cardiology: Dr. Jens Som HF Cardiology: Dr. Shirlee Latch  82 yo with history of permanent atrial fibrillation, CHF, CKD stage IV, and CAD s/p CABG presents for CHF clinic evaluation.  Patient moved to [place] from [place], where he had been followed by a cardiologist.  He says that he has been in atrial fibrillation for years.  Recent holter showed atrial fibrillation with average HR 91 bpm.  He had CABG back in 2010.  Apparently, at that time his EF was as low as 20%.  We do not have records available.    He was admitted in 11/17.  He was sent to the ER by his rheumatologist with rise in creatinine and some generalized weakness.  Bumex was decreased in the hospital and he was sent home after about a day.  Of note, echo in 11/17 showed LV EF 60-65%.    Patient reports a rise in weight at home.  He is taking Bumex 1 mg daily.  He is short of breath walking < 1 block or walking around in a store.  Very short of breath with stairs/inclines.  Leg weakness but no frank claudication.  He has chronic hip pain that limits him.  No palpitations or lightheadedness.  He has been on prednisone, apparently for gout.  This is being weaned by his rheumatologist.    Also of note, patient had a series of nose bleeds a number of years ago.  He was never started on anticoagulation or even ASA 81.  No recent epistaxis.   Labs (11/17): K 4.5, creatinine 1.83, LDL 71, HCT 35.4  ECG: atrial fibrillation at 96, nonspecific lateral T wave changes  PMH: 1. OSA 2. Atrial fibrillation: Permanent.  Present x years.  He has not been on anticoagulation due to h/o epistaxis.   - Holter (12/17): atrial fibrillation, average rate 91 bpm.  3. CKD: Stage IV. 4. Hyperlipidemia 5. Type II diabetes 6. COPD: Uses oxygen at night.  7. CAD: s/p MI.  H/o CABG in 2010.  Do not have records of anatomy.  8. Chronic diastolic CHF:  - EF 20% around time of CABG.  - Echo (11/17): EF 60-65%, mild MR, basal inferior  akinesis.  9. IBS 10. Gout 11. OA  SH: Lives in [place] (moved from [place], [place]).  Married, retired, 3 kids.  Prior smoker.   Family History  Problem Relation Age of Onset  . CAD Mother   . CAD Father    ROS: All systems reviewed and negative except as per HPI.   Current Outpatient Prescriptions  Medication Sig Dispense Refill  . acetaminophen (TYLENOL) 500 MG tablet Take 500 mg by mouth every 6 (six) hours as needed.    Marland Kitchen albuterol (PROVENTIL HFA;VENTOLIN HFA) 108 (90 Base) MCG/ACT inhaler Inhale 2 puffs into the lungs every 6 (six) hours as needed for wheezing or shortness of breath.    . allopurinol (ZYLOPRIM) 100 MG tablet Take 100 mg by mouth daily.    . carvedilol (COREG) 25 MG tablet Take 25 mg by mouth 2 (two) times daily with a meal.    . cetirizine (ZYRTEC) 5 MG tablet Take 5 mg by mouth daily as needed for allergies.    . Cholecalciferol (VITAMIN D3) 1000 units CAPS Take 2,000 Units by mouth daily.     . Cholestyramine POWD 1 packet by Does not apply route 2 (two) times daily.     . Coenzyme Q10 (CO Q-10) 100 MG CAPS Take 100 mg by mouth daily.     Marland Kitchen  diphenhydrAMINE (ALLERGY RELIEF) 25 mg capsule Take 25 mg by mouth every 6 (six) hours as needed.    . Insulin Glargine (TOUJEO SOLOSTAR [place]) Inject 40 Units into the skin at bedtime.     . Insulin Glulisine (APIDRA [place]) Inject 14 Units into the skin 3 (three) times daily.     . mirtazapine (REMERON) 15 MG tablet Take 15 mg by mouth at bedtime.    Marland Kitchen omeprazole (PRILOSEC) 20 MG capsule Take 20 mg by mouth daily.    . potassium chloride (K-DUR,KLOR-CON) 10 MEQ tablet Take 2 tablets (20 mEq total) by mouth daily. 60 tablet 0  . pravastatin (PRAVACHOL) 20 MG tablet Take 20 mg by mouth daily.    . predniSONE (DELTASONE) 10 MG tablet Take 15 mg by mouth daily with breakfast.    . promethazine (PHENERGAN) 25 MG tablet Take 25 mg by mouth every 6 (six) hours as needed for nausea or vomiting.    . terazosin (HYTRIN) 2 MG capsule  Take 2 mg by mouth at bedtime.    Marland Kitchen tiotropium (SPIRIVA) 18 MCG inhalation capsule Place 18 mcg into inhaler and inhale daily.    Marland Kitchen umeclidinium bromide (INCRUSE ELLIPTA) 62.5 MCG/INH AEPB Inhale 1 puff into the lungs daily.    Marland Kitchen apixaban (ELIQUIS) 2.5 MG TABS tablet Take 1 tablet (2.5 mg total) by mouth 2 (two) times daily. 60 tablet 3  . torsemide (DEMADEX) 20 MG tablet Take 40 mg (2 tabs) in am and 20 mg (1 tab) in pm 90 tablet 3   No current facility-administered medications for this encounter.    BP 130/70 (BP Location: Left Arm, Patient Position: Sitting, Cuff Size: Normal)   Pulse (!) 105   Wt 189 lb (85.7 kg)   SpO2 95%   BMI 29.60 kg/m  General: NAD Neck: JVP 8 cm with HJR, no thyromegaly or thyroid nodule.  Lungs: Clear to auscultation bilaterally with normal respiratory effort. CV: Nondisplaced PMI.  Heart irregular S1/S2, no S3/S4, no murmur.  1+ edema 1/2 to knees bilaterally. No carotid bruit.  Normal pedal pulses.  Abdomen: Soft, nontender, no hepatosplenomegaly, no distention.  Skin: Intact without lesions or rashes.  Neurologic: Alert and oriented x 3.  Psych: Normal affect. Extremities: No clubbing or cyanosis.  HEENT: Normal.   Assessment/Plan: 1. Chronic diastolic CHF: LV EF preserved on 11/17 echo.  On exam, he is volume overloaded and weight is rising.  - Stop Bumex.  - Start torsemide 40 qam/20 qpm.  BMET/BNP today and again in 2 wks.  2. CAD: Stable, no chest pain.   - Continue statin.  - Plan to start apixaban, so no ASA.  3. Atrial fibrillation: Permanent x years.  Very unlikely to successfully cardiovert. HR in 90s average.  - Continue Coreg, will consider adding diltiazem CD at next appointment.  - CHADSVASC =5, he should be anticoagulated.  No recent epistaxis.  I will have him start Eliquis 2.5 mg bid.  Will follow closely. He can use saline nasal spray to keep his nasal passages moist and decrease epistaxis risk.  4. CKD: Stage IV.  Follow closely  with torsemide initiation.   Marca Ancona 11/10/2016

## 2016-11-17 ENCOUNTER — Telehealth (HOSPITAL_COMMUNITY): Payer: Self-pay | Admitting: *Deleted

## 2016-11-17 NOTE — Telephone Encounter (Signed)
pts daughter returned call she is aware and agreeable.

## 2016-11-17 NOTE — Telephone Encounter (Signed)
Patients daughter left vm stating patient had his first office visit with Dr.McLean last week and his weight was up Dr.McLean d/c Bumex and started Torsemide 40am 20pm. Patient has only lost 1lb and weight is still up.  Per Mardelle Matte patient should take Torsemide 40mg  bid and have lab work next week.  I left a voice message on [phone] for her to call back.

## 2016-11-18 ENCOUNTER — Telehealth (HOSPITAL_COMMUNITY): Payer: Self-pay | Admitting: *Deleted

## 2016-11-18 ENCOUNTER — Telehealth: Payer: Self-pay | Admitting: Cardiology

## 2016-11-18 NOTE — Telephone Encounter (Signed)
Patient's daughter called in again today saying her father has increased shortness of breath with a 2 lb weight gain from yesterday.  I advised her to take patient to the ER since he continues to have weight gain and increase SOB even after increasing his meds yesterday.  She understands with no further questions.

## 2016-11-18 NOTE — Telephone Encounter (Signed)
Started in error

## 2016-11-18 NOTE — Telephone Encounter (Signed)
Verbal permission given by patient to speak to daughter regarding med changes, symptoms. She notes changes enacted by HF clinic yesterday, pt not improving. Reports wt up this AM by 2 lbs. Denies worsening SOB.  Per recommendations if he failed to get improvement w fluid weight, SOB, he should go to ED for evaluation. I affirmed these recommendations. Daughter notes understanding. She states pt strongly prefers to avoid going to hospital if possible, however, just took morning meds and will go later this morning/early afternoon if not improved. I urged for any worsening SOB to go to ED promptly. She acknowledged instructions.

## 2016-11-18 NOTE — Telephone Encounter (Signed)
Devin Heath is calling because her father [name] has short of breath a little more than usual . His weight has gone up , and they have double his fluid pill . Please call

## 2016-11-21 ENCOUNTER — Emergency Department (HOSPITAL_COMMUNITY): Payer: Medicare Other

## 2016-11-21 ENCOUNTER — Inpatient Hospital Stay (HOSPITAL_COMMUNITY)
Admission: EM | Admit: 2016-11-21 | Discharge: 2016-12-01 | DRG: 377 | Disposition: A | Discharge status: Skilled Nursing Facility | Payer: Medicare Other | Attending: Internal Medicine | Admitting: Internal Medicine

## 2016-11-21 DIAGNOSIS — K921 Melena: Secondary | ICD-10-CM | POA: Diagnosis present

## 2016-11-21 DIAGNOSIS — I2510 Atherosclerotic heart disease of native coronary artery without angina pectoris: Secondary | ICD-10-CM

## 2016-11-21 DIAGNOSIS — M542 Cervicalgia: Secondary | ICD-10-CM | POA: Diagnosis not present

## 2016-11-21 DIAGNOSIS — H9190 Unspecified hearing loss, unspecified ear: Secondary | ICD-10-CM | POA: Diagnosis present

## 2016-11-21 DIAGNOSIS — E1111 Type 2 diabetes mellitus with ketoacidosis with coma: Secondary | ICD-10-CM | POA: Diagnosis not present

## 2016-11-21 DIAGNOSIS — Z7901 Long term (current) use of anticoagulants: Secondary | ICD-10-CM

## 2016-11-21 DIAGNOSIS — K922 Gastrointestinal hemorrhage, unspecified: Secondary | ICD-10-CM | POA: Diagnosis not present

## 2016-11-21 DIAGNOSIS — T380X5A Adverse effect of glucocorticoids and synthetic analogues, initial encounter: Secondary | ICD-10-CM | POA: Diagnosis not present

## 2016-11-21 DIAGNOSIS — M069 Rheumatoid arthritis, unspecified: Secondary | ICD-10-CM | POA: Diagnosis present

## 2016-11-21 DIAGNOSIS — K589 Irritable bowel syndrome without diarrhea: Secondary | ICD-10-CM | POA: Diagnosis present

## 2016-11-21 DIAGNOSIS — I255 Ischemic cardiomyopathy: Secondary | ICD-10-CM | POA: Diagnosis not present

## 2016-11-21 DIAGNOSIS — B029 Zoster without complications: Secondary | ICD-10-CM | POA: Diagnosis not present

## 2016-11-21 DIAGNOSIS — E1110 Type 2 diabetes mellitus with ketoacidosis without coma: Secondary | ICD-10-CM | POA: Diagnosis present

## 2016-11-21 DIAGNOSIS — I482 Chronic atrial fibrillation: Secondary | ICD-10-CM | POA: Diagnosis present

## 2016-11-21 DIAGNOSIS — Z79899 Other long term (current) drug therapy: Secondary | ICD-10-CM

## 2016-11-21 DIAGNOSIS — M109 Gout, unspecified: Secondary | ICD-10-CM | POA: Diagnosis present

## 2016-11-21 DIAGNOSIS — N179 Acute kidney failure, unspecified: Secondary | ICD-10-CM | POA: Diagnosis present

## 2016-11-21 DIAGNOSIS — Z794 Long term (current) use of insulin: Secondary | ICD-10-CM

## 2016-11-21 DIAGNOSIS — Z515 Encounter for palliative care: Secondary | ICD-10-CM | POA: Diagnosis not present

## 2016-11-21 DIAGNOSIS — IMO0002 Reserved for concepts with insufficient information to code with codable children: Secondary | ICD-10-CM

## 2016-11-21 DIAGNOSIS — E273 Drug-induced adrenocortical insufficiency: Secondary | ICD-10-CM | POA: Diagnosis not present

## 2016-11-21 DIAGNOSIS — N178 Other acute kidney failure: Secondary | ICD-10-CM | POA: Diagnosis not present

## 2016-11-21 DIAGNOSIS — N184 Chronic kidney disease, stage 4 (severe): Secondary | ICD-10-CM | POA: Diagnosis present

## 2016-11-21 DIAGNOSIS — I959 Hypotension, unspecified: Secondary | ICD-10-CM | POA: Diagnosis present

## 2016-11-21 DIAGNOSIS — Z87891 Personal history of nicotine dependence: Secondary | ICD-10-CM | POA: Diagnosis not present

## 2016-11-21 DIAGNOSIS — G4733 Obstructive sleep apnea (adult) (pediatric): Secondary | ICD-10-CM | POA: Diagnosis present

## 2016-11-21 DIAGNOSIS — J449 Chronic obstructive pulmonary disease, unspecified: Secondary | ICD-10-CM | POA: Diagnosis not present

## 2016-11-21 DIAGNOSIS — N172 Acute kidney failure with medullary necrosis: Secondary | ICD-10-CM | POA: Diagnosis not present

## 2016-11-21 DIAGNOSIS — D62 Acute posthemorrhagic anemia: Secondary | ICD-10-CM

## 2016-11-21 DIAGNOSIS — I5042 Chronic combined systolic (congestive) and diastolic (congestive) heart failure: Secondary | ICD-10-CM | POA: Diagnosis present

## 2016-11-21 DIAGNOSIS — E1165 Type 2 diabetes mellitus with hyperglycemia: Secondary | ICD-10-CM | POA: Diagnosis present

## 2016-11-21 DIAGNOSIS — I25118 Atherosclerotic heart disease of native coronary artery with other forms of angina pectoris: Secondary | ICD-10-CM | POA: Diagnosis present

## 2016-11-21 DIAGNOSIS — I420 Dilated cardiomyopathy: Secondary | ICD-10-CM | POA: Diagnosis present

## 2016-11-21 DIAGNOSIS — E785 Hyperlipidemia, unspecified: Secondary | ICD-10-CM | POA: Diagnosis present

## 2016-11-21 DIAGNOSIS — E876 Hypokalemia: Secondary | ICD-10-CM | POA: Diagnosis present

## 2016-11-21 DIAGNOSIS — E1122 Type 2 diabetes mellitus with diabetic chronic kidney disease: Secondary | ICD-10-CM | POA: Diagnosis present

## 2016-11-21 DIAGNOSIS — M4810 Ankylosing hyperostosis [Forestier], site unspecified: Secondary | ICD-10-CM | POA: Diagnosis present

## 2016-11-21 DIAGNOSIS — I4891 Unspecified atrial fibrillation: Secondary | ICD-10-CM

## 2016-11-21 DIAGNOSIS — G2581 Restless legs syndrome: Secondary | ICD-10-CM | POA: Diagnosis present

## 2016-11-21 DIAGNOSIS — I25810 Atherosclerosis of coronary artery bypass graft(s) without angina pectoris: Secondary | ICD-10-CM | POA: Diagnosis not present

## 2016-11-21 DIAGNOSIS — E118 Type 2 diabetes mellitus with unspecified complications: Secondary | ICD-10-CM | POA: Diagnosis not present

## 2016-11-21 DIAGNOSIS — I5033 Acute on chronic diastolic (congestive) heart failure: Secondary | ICD-10-CM

## 2016-11-21 DIAGNOSIS — R0602 Shortness of breath: Secondary | ICD-10-CM

## 2016-11-21 DIAGNOSIS — Z66 Do not resuscitate: Secondary | ICD-10-CM | POA: Diagnosis present

## 2016-11-21 DIAGNOSIS — Z9981 Dependence on supplemental oxygen: Secondary | ICD-10-CM

## 2016-11-21 DIAGNOSIS — R531 Weakness: Secondary | ICD-10-CM | POA: Diagnosis present

## 2016-11-21 DIAGNOSIS — E861 Hypovolemia: Secondary | ICD-10-CM | POA: Diagnosis present

## 2016-11-21 DIAGNOSIS — E872 Acidosis, unspecified: Secondary | ICD-10-CM | POA: Diagnosis present

## 2016-11-21 DIAGNOSIS — D72829 Elevated white blood cell count, unspecified: Secondary | ICD-10-CM

## 2016-11-21 DIAGNOSIS — Z7189 Other specified counseling: Secondary | ICD-10-CM | POA: Diagnosis not present

## 2016-11-21 LAB — CBC WITH DIFFERENTIAL/PLATELET
BASOS PCT: 0 %
Basophils Absolute: 0 10*3/uL (ref 0.0–0.1)
EOS ABS: 0.2 10*3/uL (ref 0.0–0.7)
EOS PCT: 1 %
HEMATOCRIT: 32 % — AB (ref 39.0–52.0)
Hemoglobin: 10.3 g/dL — ABNORMAL LOW (ref 13.0–17.0)
LYMPHS PCT: 11 %
Lymphs Abs: 2.5 10*3/uL (ref 0.7–4.0)
MCH: 26.4 pg (ref 26.0–34.0)
MCHC: 32.2 g/dL (ref 30.0–36.0)
MCV: 82.1 fL (ref 78.0–100.0)
Monocytes Absolute: 1.4 10*3/uL — ABNORMAL HIGH (ref 0.1–1.0)
Monocytes Relative: 6 %
NEUTROS ABS: 18.7 10*3/uL — AB (ref 1.7–7.7)
Neutrophils Relative %: 82 %
PLATELETS: 218 10*3/uL (ref 150–400)
RBC: 3.9 MIL/uL — ABNORMAL LOW (ref 4.22–5.81)
RDW: 15 % (ref 11.5–15.5)
WBC: 22.8 10*3/uL — ABNORMAL HIGH (ref 4.0–10.5)

## 2016-11-21 LAB — COMPREHENSIVE METABOLIC PANEL
ALBUMIN: 3.3 g/dL — AB (ref 3.5–5.0)
ALK PHOS: 70 U/L (ref 38–126)
ALT: 17 U/L (ref 17–63)
ANION GAP: 22 — AB (ref 5–15)
AST: 28 U/L (ref 15–41)
BUN: 51 mg/dL — ABNORMAL HIGH (ref 6–20)
CALCIUM: 8.6 mg/dL — AB (ref 8.9–10.3)
CHLORIDE: 89 mmol/L — AB (ref 101–111)
CO2: 21 mmol/L — AB (ref 22–32)
CREATININE: 2.82 mg/dL — AB (ref 0.61–1.24)
GFR calc Af Amer: 22 mL/min — ABNORMAL LOW (ref >60)
GFR calc non Af Amer: 19 mL/min — ABNORMAL LOW (ref >60)
GLUCOSE: 344 mg/dL — AB (ref 65–99)
Potassium: 3.1 mmol/L — ABNORMAL LOW (ref 3.5–5.1)
SODIUM: 132 mmol/L — AB (ref 135–145)
Total Bilirubin: 1.2 mg/dL (ref 0.3–1.2)
Total Protein: 6.3 g/dL — ABNORMAL LOW (ref 6.5–8.1)

## 2016-11-21 LAB — GLUCOSE, CAPILLARY: Glucose-Capillary: 201 mg/dL — ABNORMAL HIGH (ref 65–99)

## 2016-11-21 LAB — CBC
HCT: 31.6 % — ABNORMAL LOW (ref 39.0–52.0)
Hemoglobin: 10.5 g/dL — ABNORMAL LOW (ref 13.0–17.0)
MCH: 27.3 pg (ref 26.0–34.0)
MCHC: 33.2 g/dL (ref 30.0–36.0)
MCV: 82.1 fL (ref 78.0–100.0)
PLATELETS: 161 10*3/uL (ref 150–400)
RBC: 3.85 MIL/uL — AB (ref 4.22–5.81)
RDW: 14.8 % (ref 11.5–15.5)
WBC: 17.1 10*3/uL — ABNORMAL HIGH (ref 4.0–10.5)

## 2016-11-21 LAB — PROTIME-INR
INR: 1.4
Prothrombin Time: 17.3 seconds — ABNORMAL HIGH (ref 11.4–15.2)

## 2016-11-21 LAB — I-STAT TROPONIN, ED: TROPONIN I, POC: 0.02 ng/mL (ref 0.00–0.08)

## 2016-11-21 LAB — I-STAT CG4 LACTIC ACID, ED: Lactic Acid, Venous: 8.57 mmol/L (ref 0.5–1.9)

## 2016-11-21 LAB — POC OCCULT BLOOD, ED: FECAL OCCULT BLD: POSITIVE — AB

## 2016-11-21 LAB — LACTIC ACID, PLASMA
LACTIC ACID, VENOUS: 2.8 mmol/L — AB (ref 0.5–1.9)
Lactic Acid, Venous: 1.9 mmol/L (ref 0.5–1.9)

## 2016-11-21 LAB — PREPARE RBC (CROSSMATCH)

## 2016-11-21 LAB — MRSA PCR SCREENING: MRSA by PCR: NEGATIVE

## 2016-11-21 LAB — AMMONIA: Ammonia: 37 umol/L — ABNORMAL HIGH (ref 9–35)

## 2016-11-21 LAB — ABO/RH: ABO/RH(D): O POS

## 2016-11-21 MED ORDER — SODIUM CHLORIDE 0.9 % IV SOLN
1250.0000 mg | INTRAVENOUS | Status: DC
  Administered 2016-11-21: 1250 mg via INTRAVENOUS
  Filled 2016-11-21: qty 1250

## 2016-11-21 MED ORDER — HYDROCORTISONE NA SUCCINATE PF 100 MG IJ SOLR
50.0000 mg | Freq: Four times a day (QID) | INTRAMUSCULAR | Status: DC
  Administered 2016-11-21 – 2016-11-22 (×2): 50 mg via INTRAVENOUS
  Filled 2016-11-21 (×2): qty 2

## 2016-11-21 MED ORDER — MIRTAZAPINE 15 MG PO TABS
15.0000 mg | ORAL_TABLET | Freq: Every day | ORAL | Status: DC
Start: 2016-11-21 — End: 2016-11-29
  Administered 2016-11-21 – 2016-11-28 (×8): 15 mg via ORAL
  Filled 2016-11-21 (×8): qty 1

## 2016-11-21 MED ORDER — POTASSIUM CHLORIDE CRYS ER 20 MEQ PO TBCR
40.0000 meq | EXTENDED_RELEASE_TABLET | Freq: Once | ORAL | Status: AC
  Administered 2016-11-21: 40 meq via ORAL
  Filled 2016-11-21: qty 2

## 2016-11-21 MED ORDER — PANTOPRAZOLE SODIUM 40 MG IV SOLR
40.0000 mg | Freq: Once | INTRAVENOUS | Status: AC
  Administered 2016-11-21: 40 mg via INTRAVENOUS
  Filled 2016-11-21: qty 40

## 2016-11-21 MED ORDER — IPRATROPIUM-ALBUTEROL 0.5-2.5 (3) MG/3ML IN SOLN
3.0000 mL | Freq: Four times a day (QID) | RESPIRATORY_TRACT | Status: DC
  Administered 2016-11-21 – 2016-11-22 (×2): 3 mL via RESPIRATORY_TRACT
  Filled 2016-11-21 (×2): qty 3

## 2016-11-21 MED ORDER — SODIUM CHLORIDE 0.9 % IV SOLN: 10.0000 mL/h | Freq: Once | INTRAVENOUS | Status: DC

## 2016-11-21 MED ORDER — PRAVASTATIN SODIUM 40 MG PO TABS
20.0000 mg | ORAL_TABLET | Freq: Every day | ORAL | Status: DC
Start: 2016-11-21 — End: 2016-11-22

## 2016-11-21 MED ORDER — DILTIAZEM HCL 100 MG IV SOLR
5.0000 mg/h | INTRAVENOUS | Status: DC
  Administered 2016-11-22: 10 mg/h via INTRAVENOUS
  Administered 2016-11-23 – 2016-11-24 (×2): 5 mg/h via INTRAVENOUS
  Filled 2016-11-21 (×4): qty 100

## 2016-11-21 MED ORDER — LEVOFLOXACIN IN D5W 500 MG/100ML IV SOLN
500.0000 mg | INTRAVENOUS | Status: DC
  Administered 2016-11-21: 500 mg via INTRAVENOUS
  Filled 2016-11-21: qty 100

## 2016-11-21 MED ORDER — SODIUM CHLORIDE 0.9 % IV SOLN: Freq: Once | INTRAVENOUS | Status: DC

## 2016-11-21 MED ORDER — DILTIAZEM HCL 25 MG/5ML IV SOLN
10.0000 mg | Freq: Once | INTRAVENOUS | Status: AC
  Administered 2016-11-21: 5 mg via INTRAVENOUS
  Filled 2016-11-21: qty 5

## 2016-11-21 MED ORDER — ALBUTEROL SULFATE (2.5 MG/3ML) 0.083% IN NEBU: 2.5000 mg | INHALATION_SOLUTION | RESPIRATORY_TRACT | Status: DC | PRN

## 2016-11-21 MED ORDER — PANTOPRAZOLE SODIUM 40 MG IV SOLR: 40.0000 mg | Freq: Two times a day (BID) | INTRAVENOUS | Status: DC

## 2016-11-21 MED ORDER — DEXTROSE 5 % IV SOLN
5.0000 mg/h | Freq: Once | INTRAVENOUS | Status: AC
Start: 2016-11-21 — End: 2016-11-21
  Administered 2016-11-21: 5 mg/h via INTRAVENOUS
  Filled 2016-11-21: qty 100

## 2016-11-21 MED ORDER — SODIUM CHLORIDE 0.9 % IV SOLN
80.0000 mg | Freq: Once | INTRAVENOUS | Status: AC
  Administered 2016-11-21: 20:00:00 80 mg via INTRAVENOUS
  Filled 2016-11-21: qty 80

## 2016-11-21 MED ORDER — INSULIN ASPART 100 UNIT/ML ~~LOC~~ SOLN
0.0000 [IU] | Freq: Three times a day (TID) | SUBCUTANEOUS | Status: DC
  Administered 2016-11-22: 5 [IU] via SUBCUTANEOUS

## 2016-11-21 MED ORDER — INSULIN ASPART 100 UNIT/ML ~~LOC~~ SOLN: 0.0000 [IU] | Freq: Every day | SUBCUTANEOUS | Status: DC

## 2016-11-21 MED ORDER — SODIUM CHLORIDE 0.9 % IV SOLN
8.0000 mg/h | INTRAVENOUS | Status: DC
  Administered 2016-11-21 – 2016-11-22 (×2): 8 mg/h via INTRAVENOUS
  Filled 2016-11-21 (×8): qty 80

## 2016-11-21 NOTE — ED Notes (Signed)
Attempted to call report. Floor RN to call back in 10 mins

## 2016-11-21 NOTE — H&P (Signed)
PULMONARY / CRITICAL CARE MEDICINE   Name: Devin Heath MRN: [id number] DOB: 01-12-1929    ADMISSION DATE:  11/21/2016 CONSULTATION DATE:    Cindi Carbon MD:  Corlis Leak   CHIEF COMPLAINT:  GIB, lactic acidosis   HISTORY OF PRESENT ILLNESS:   81 year old male w/ sig h/o AF, ICM w/ EF 20%, stage IV CKD, COPD and OSA. F/b lebaeur Cards. Recently re-located from [place] to be closer to family due to worsening physical decline over the last year. Was just started on Eliquis (w/ CHADVASC 5). Per pt and family pt had been feeling weaker for the last 2 d PTA. On day of admit pt could not get OOB. Was incont. On arrival to ER had large melanic stool. HR 180s AF. Lactic acid was 8.6, initial Hgb 10.3 (baseline ~11).  He was treated w/ IV dilt, Typed,screened and transfused 1 u PRBC. PCCM asked to admit.   PAST MEDICAL HISTORY :  He  has a past medical history of A-fib (HCC); Arthritis; CAD (coronary artery disease); COPD (chronic obstructive pulmonary disease) (HCC); Diabetes (HCC); Former smoker; Gout; Hard of hearing; Heart failure (HCC); Hyperlipidemia; IBS (irritable bowel syndrome); Ischemic cardiomyopathy; On home oxygen therapy; OSA on CPAP; Renal insufficiency; Rheumatoid arthritis (HCC); RLS (restless legs syndrome); and Vitamin D deficiency.  PAST SURGICAL HISTORY: He  has a past surgical history that includes Tonsillectomy; Appendectomy; Finger amputation (Left); Cardiac surgery; and Neck surgery.  Allergies  Allergen Reactions  . Ace Inhibitors Cough  . Ambrisentan Other (See Comments)  . Clindamycin/Lincomycin Other (See Comments)  . Erythromycin Other (See Comments)  . Feldene [Piroxicam] Other (See Comments)  . Keflex [Cephalexin] Other (See Comments)  . Omnicef [Cefdinir] Other (See Comments)  . Simvastatin Other (See Comments)    No current facility-administered medications on file prior to encounter.    Current Outpatient Prescriptions on File Prior to Encounter   Medication Sig  . acetaminophen (TYLENOL) 500 MG tablet Take 500 mg by mouth every 6 (six) hours as needed for moderate pain.   Marland Kitchen albuterol (PROVENTIL HFA;VENTOLIN HFA) 108 (90 Base) MCG/ACT inhaler Inhale 2 puffs into the lungs every 6 (six) hours as needed for wheezing or shortness of breath.  . allopurinol (ZYLOPRIM) 100 MG tablet Take 100 mg by mouth daily.  Marland Kitchen apixaban (ELIQUIS) 2.5 MG TABS tablet Take 1 tablet (2.5 mg total) by mouth 2 (two) times daily.  . carvedilol (COREG) 25 MG tablet Take 25 mg by mouth 2 (two) times daily with a meal.  . cetirizine (ZYRTEC) 5 MG tablet Take 5 mg by mouth daily as needed for allergies.  . Cholecalciferol (VITAMIN D3) 1000 units CAPS Take 2,000 Units by mouth daily.   . Coenzyme Q10 (CO Q-10) 100 MG CAPS Take 100 mg by mouth daily.   . diphenhydrAMINE (ALLERGY RELIEF) 25 mg capsule Take 25 mg by mouth every 6 (six) hours as needed for itching or allergies.   . mirtazapine (REMERON) 15 MG tablet Take 15 mg by mouth at bedtime.  Marland Kitchen omeprazole (PRILOSEC) 20 MG capsule Take 20 mg by mouth daily.  . potassium chloride (K-DUR,KLOR-CON) 10 MEQ tablet Take 2 tablets (20 mEq total) by mouth daily.  . pravastatin (PRAVACHOL) 20 MG tablet Take 20 mg by mouth daily.  . predniSONE (DELTASONE) 10 MG tablet Take 15 mg by mouth daily with breakfast.  . promethazine (PHENERGAN) 25 MG tablet Take 25 mg by mouth every 6 (six) hours as needed for nausea or vomiting.  . terazosin (HYTRIN)  2 MG capsule Take 2 mg by mouth at bedtime.  Marland Kitchen tiotropium (SPIRIVA) 18 MCG inhalation capsule Place 18 mcg into inhaler and inhale daily.  Marland Kitchen torsemide (DEMADEX) 20 MG tablet Take 40 mg (2 tabs) in am and 20 mg (1 tab) in pm  . umeclidinium bromide (INCRUSE ELLIPTA) 62.5 MCG/INH AEPB Inhale 1 puff into the lungs daily.    FAMILY HISTORY:  His indicated that his mother is deceased. He indicated that his father is deceased. He indicated that his maternal grandmother is deceased. He  indicated that his maternal grandfather is deceased. He indicated that his paternal grandmother is deceased. He indicated that his paternal grandfather is deceased.    SOCIAL HISTORY: He  reports that he has quit smoking. He has never used smokeless tobacco. He reports that he does not drink alcohol or use drugs.  REVIEW OF SYSTEMS:   Unable to take   SUBJECTIVE:  Lethargic   VITAL SIGNS: BP (!) 97/52   Pulse (!) 126   Temp 100.3 F (37.9 C) (Oral)   Resp 24   SpO2 96%   HEMODYNAMICS:    VENTILATOR SETTINGS:    INTAKE / OUTPUT: No intake/output data recorded.  PHYSICAL EXAMINATION: General:  Chronically ill appearing male, lethargic but will participate in discussion Neuro:  Oriented X 3. Moves all extremities but weak  HEENT:  NCAT, no JVD  Cardiovascular:  Tachy AF II/VI SEM  Lungs:  Clear and w/out accessory use  Abdomen:  Soft, not tender + bowel sounds  Musculoskeletal:  Equal st and bulk  Skin:  Cool, LE trace edema   LABS:  BMET  Recent Labs Lab 11/21/16 1617  NA 132*  K 3.1*  CL 89*  CO2 21*  BUN 51*  CREATININE 2.82*  GLUCOSE 344*    Electrolytes  Recent Labs Lab 11/21/16 1617  CALCIUM 8.6*    CBC  Recent Labs Lab 11/21/16 1617  WBC 22.8*  HGB 10.3*  HCT 32.0*  PLT 218    Coag's  Recent Labs Lab 11/21/16 1617  INR 1.40    Sepsis Markers  Recent Labs Lab 11/21/16 1627  LATICACIDVEN 8.57*    ABG No results for input(s): PHART, PCO2ART, PO2ART in the last 168 hours.  Liver Enzymes  Recent Labs Lab 11/21/16 1617  AST 28  ALT 17  ALKPHOS 70  BILITOT 1.2  ALBUMIN 3.3*    Cardiac Enzymes No results for input(s): TROPONINI, PROBNP in the last 168 hours.  Glucose No results for input(s): GLUCAP in the last 168 hours.  Imaging Dg Chest Portable 1 View  Result Date: 11/21/2016 CLINICAL DATA:  Pt with weight gain this AM of 2 lbs, pt has increased SOB. Hx CHF, COPD, CAD, afib EXAM: PORTABLE CHEST 1 VIEW  COMPARISON:  10/13/2016 FINDINGS: Status post median sternotomy and CABG. Heart size is normal. Lungs are clear. No pulmonary edema. Degenerative changes are seen in thoracic spine. IMPRESSION: No evidence for acute  abnormality. Electronically Signed   By: Norva Pavlov M.D.   On: 11/21/2016 17:03     STUDIES:    CULTURES:   ANTIBIOTICS:   SIGNIFICANT EVENTS:   LINES/TUBES:  DISCUSSION: 87 yom w/ sig h/o af, ICM, CKD and CAD. Now admitted w/ prob UGIB after recently started on NOAC. Further c/b AF w/ RVR and lactic acidosis. Do not think that his lactic acidosis is due to sepsis, but rather it is explained by the hypovolemia, and possible transient bowel ischemia in setting of known  severe ICM. Will complete blood transfusion, control HR w/ cardiazem, trend cbc and continue supportive care. No more NOAC. Not a candidate for endoscopic procedures given ICM and inability to tolerate sedation. We confirmed DNR status.   ASSESSMENT / PLAN:  PULMONARY A: H/o COPD H/o OSA P:   Scheduled nebs O2 at HS Cont pulse ox   CARDIOVASCULAR A:  ICM w/ EF 20% Af w/ RVR CAD Diastolic HF (chronic) P:  Rate control w/ CCB gtt KVO IVFs after blood  Tele See endocrine (re: stress dose steroids) DNR   RENAL A:   CKD stage IV  Acute on chronic renal failure  Hypokalemia  Lactic acidosis w/out hypotension P:   Renal dose meds Getting blood, will need to be gentle on volume Holding diuretics.   GASTROINTESTINAL A:   Melena  Probable UGIB ? Hepatic congestion  P:   PPI BID NPO except meds  Stop NOAC Ck ammonia   HEMATOLOGIC A:   Acute blood loss anemia  P:  F/u CBC s/p transfusion  Stopped NOAC Trend CBC  INFECTIOUS A:   No evidence of infection  P:   Will not cont abx   ENDOCRINE A:   Probable relative adrenal dysfxn (steroids for gout) DM  P:   Stress dose steroids  ssi   NEUROLOGIC A:   Generalized weakness Right foot pain ? Injury vs gout?  P:    Supportive care  Hold sedating meds  May need to image  right foot  FAMILY  - Updates: wife and son updated at bedside   - goals of care discussed w/ Kreg Shropshire, Dr Delton Coombes, pt, Nurse and his family. He will be DNR w/ full medical care up to that point.   Simonne Martinet ACNP-BC [facility] Pulmonary/Critical Care Pager # [phone] OR # [phone] if no answer 11/21/2016, 5:46 PM  Attending Note:  I have examined patient, reviewed labs, studies and notes. I have discussed the case with Kreg Shropshire, and I agree with the data and plans as amended above. 82 yo man with hx ischemic CHF, A fib, CKD4, COPD. He was recently started on Eliquis, had been tolerating. Then developed weakness and lethargy over last 2 days. He was unable to get up without assist 12/30, was brough to the ED. He was in A fib HR 180's. In the ED had large amt melena initial Hgb 10.3. Lactate was 8.6 consistent w hypoperfusion. On exam he is sleepy but wakes and interacts appropriately. resp pattern is comfortable, lung exam clear. Tachycardic and irregular. His abdominal exam is benign. Good discussion with patient and his family regarding active issues and his goals for care. He would want all medical therapy, but would not want to commit to CPR, MV, invasive care that would lead to a debilitated state. DNR orders placed. We will continue dilt gtt, follow melena and Hgb for stabilization off eliquis. Will defer GI consultation for now - not clear that he would want invasive procedure. Would have to revisit this with him, family if GIB worsens. Hold home insulin and cover w SSI. Add stress steroids.  Defer abx for now. Admit to ICU.  Independent critical care time is 60 minutes.   Levy Pupa, MD, PhD 11/21/2016, 6:46 PM [facility] [phone] or if no answer [phone]

## 2016-11-21 NOTE — ED Notes (Signed)
Bright red blood noted during rectal exam with Dr Corlis Leak.

## 2016-11-21 NOTE — ED Provider Notes (Signed)
[facility]ENCY DEPT Provider Note   CSN: [id number] Arrival date & time: 11/21/16  1545     History   Chief Complaint Chief Complaint  Patient presents with  . Rectal Bleeding  . Shortness of Breath  . Fall  . Weakness    HPI Devin Heath is a 82 y.o. male.  HPI   Patient is a 82 year old male with A. fib, ischemic cardiac myopathy last EF of 20% COPD, recently started on Eloquis by Dr. Shirlee Latch. Patient's been feeling poorly for the last 2 days. Today he tried to get chair and was felt too weak. He had a fall on his way to talk to his wife. They called patient's daughter-in-law who brought him to the emergency department.  In the waiting room patient had episode of maroon stool.  Past Medical History:  Diagnosis Date  . A-fib (HCC)   . Arthritis   . CAD (coronary artery disease)   . COPD (chronic obstructive pulmonary disease) (HCC)   . Diabetes (HCC)   . Former smoker   . Gout   . Hard of hearing   . Heart failure (HCC)   . Hyperlipidemia   . IBS (irritable bowel syndrome)   . Ischemic cardiomyopathy   . On home oxygen therapy    "2L q hs" (10/13/2016)  . OSA on CPAP   . Renal insufficiency   . Rheumatoid arthritis (HCC)   . RLS (restless legs syndrome)   . Vitamin D deficiency     Patient Active Problem List   Diagnosis Date Noted  . Chronic anticoagulation   . Lactic acidosis 11/21/2016  . GIB (gastrointestinal bleeding) 11/21/2016  . Melena   . Acute blood loss anemia   . Coronary artery disease involving coronary bypass graft of native heart without angina pectoris   . Hypokalemia 10/13/2016  . Chest pain with high risk for cardiac etiology 10/13/2016  . Abnormal EKG 10/13/2016  . Hyperglycemia 10/13/2016  . Leukocytosis 10/13/2016  . Acute on chronic renal insufficiency   . OSA (obstructive sleep apnea)   . Ischemic cardiomyopathy   . IBS (irritable bowel syndrome)   . Hyperlipidemia   . Heart failure (HCC)   . Diabetes (HCC)   . Chronic  obstructive pulmonary disease (HCC)   . CAD (coronary artery disease)   . Rheumatoid arthritis (HCC)   . Atrial fibrillation with rapid ventricular response (HCC)   . OSA on CPAP   . RLS (restless legs syndrome)   . Vitamin D deficiency   . Gout   . Hard of hearing   . Former smoker     Past Surgical History:  Procedure Laterality Date  . APPENDECTOMY    . CARDIAC SURGERY     BY PASS  . FINGER AMPUTATION Left    RING FINGER  . NECK SURGERY    . TONSILLECTOMY         Home Medications    Prior to Admission medications   Medication Sig Start Date End Date Taking? Authorizing Provider  acetaminophen (TYLENOL) 500 MG tablet Take 500 mg by mouth every 6 (six) hours as needed for moderate pain.    Yes Historical Provider, MD  albuterol (PROVENTIL HFA;VENTOLIN HFA) 108 (90 Base) MCG/ACT inhaler Inhale 2 puffs into the lungs every 6 (six) hours as needed for wheezing or shortness of breath.   Yes Historical Provider, MD  allopurinol (ZYLOPRIM) 100 MG tablet Take 100 mg by mouth daily.   Yes Historical Provider, MD  apixaban (ELIQUIS) 2.5  MG TABS tablet Take 1 tablet (2.5 mg total) by mouth 2 (two) times daily. 11/09/16  Yes Laurey Morale, MD  carvedilol (COREG) 25 MG tablet Take 25 mg by mouth 2 (two) times daily with a meal.   Yes Historical Provider, MD  cetirizine (ZYRTEC) 5 MG tablet Take 5 mg by mouth daily as needed for allergies.   Yes Historical Provider, MD  Cholecalciferol (VITAMIN D3) 1000 units CAPS Take 2,000 Units by mouth daily.    Yes Historical Provider, MD  cholestyramine (QUESTRAN) 4 g packet Take 4 g by mouth 2 (two) times daily.   Yes Historical Provider, MD  Coenzyme Q10 (CO Q-10) 100 MG CAPS Take 100 mg by mouth daily.    Yes Historical Provider, MD  diphenhydrAMINE (ALLERGY RELIEF) 25 mg capsule Take 25 mg by mouth every 6 (six) hours as needed for itching or allergies.    Yes Historical Provider, MD  Insulin Glargine (TOUJEO SOLOSTAR) 300 UNIT/ML SOPN Inject  40 Units into the skin at bedtime.   Yes Historical Provider, MD  insulin glulisine (APIDRA) 100 UNIT/ML injection Inject 14 Units into the skin 3 (three) times daily before meals.   Yes Historical Provider, MD  mirtazapine (REMERON) 15 MG tablet Take 15 mg by mouth at bedtime.   Yes Historical Provider, MD  omeprazole (PRILOSEC) 20 MG capsule Take 20 mg by mouth daily.   Yes Historical Provider, MD  potassium chloride (K-DUR,KLOR-CON) 10 MEQ tablet Take 2 tablets (20 mEq total) by mouth daily. 10/14/16  Yes Richarda Overlie, MD  pravastatin (PRAVACHOL) 20 MG tablet Take 20 mg by mouth daily.   Yes Historical Provider, MD  predniSONE (DELTASONE) 10 MG tablet Take 15 mg by mouth daily with breakfast.   Yes Historical Provider, MD  promethazine (PHENERGAN) 25 MG tablet Take 25 mg by mouth every 6 (six) hours as needed for nausea or vomiting.   Yes Historical Provider, MD  terazosin (HYTRIN) 2 MG capsule Take 2 mg by mouth at bedtime.   Yes Historical Provider, MD  tiotropium (SPIRIVA) 18 MCG inhalation capsule Place 18 mcg into inhaler and inhale daily.   Yes Historical Provider, MD  torsemide (DEMADEX) 20 MG tablet Take 40 mg (2 tabs) in am and 20 mg (1 tab) in pm 11/09/16  Yes Laurey Morale, MD  umeclidinium bromide (INCRUSE ELLIPTA) 62.5 MCG/INH AEPB Inhale 1 puff into the lungs daily.   Yes Historical Provider, MD    Family History Family History  Problem Relation Age of Onset  . CAD Mother   . CAD Father     Social History Social History  Substance Use Topics  . Smoking status: Former Games developer  . Smokeless tobacco: Never Used  . Alcohol use No     Allergies   Ace inhibitors; Ambrisentan; Clindamycin/lincomycin; Erythromycin; Feldene [piroxicam]; Keflex [cephalexin]; Omnicef [cefdinir]; and Simvastatin   Review of Systems Review of Systems  Constitutional: Positive for fatigue.  Respiratory: Negative for chest tightness and shortness of breath.   Cardiovascular: Negative for chest  pain.  Gastrointestinal: Negative for abdominal pain.  Neurological: Positive for weakness and light-headedness. Negative for speech difficulty.  All other systems reviewed and are negative.    Physical Exam Updated Vital Signs BP (!) 122/55 (BP Location: Left Arm)   Pulse 90   Temp 97.7 F (36.5 C) (Oral)   Resp 19   Ht 5\' 7"  (1.702 m)   Wt 187 lb 6.3 oz (85 kg)   SpO2 96%   BMI  29.35 kg/m   Physical Exam  Constitutional: He is oriented to person, place, and time. He appears well-nourished.  HENT:  Head: Normocephalic and atraumatic.  Eyes: Conjunctivae are normal.  Cardiovascular:  Irregularly irregular tachycardic.  Pulmonary/Chest: Effort normal and breath sounds normal. No respiratory distress. He has no wheezes.  Abdominal: Soft. He exhibits no distension. There is no tenderness.  Musculoskeletal: He exhibits no edema.  Tender to L ankle  Neurological: He is oriented to person, place, and time. No cranial nerve deficit. Coordination normal.  Skin: Skin is warm and dry. Capillary refill takes more than 3 seconds. He is not diaphoretic. There is pallor.  Psychiatric: His behavior is normal.     ED Treatments / Results  Labs (all labs ordered are listed, but only abnormal results are displayed) Labs Reviewed  COMPREHENSIVE METABOLIC PANEL - Abnormal; Notable for the following:       Result Value   Sodium 132 (*)    Potassium 3.1 (*)    Chloride 89 (*)    CO2 21 (*)    Glucose, Bld 344 (*)    BUN 51 (*)    Creatinine, Ser 2.82 (*)    Calcium 8.6 (*)    Total Protein 6.3 (*)    Albumin 3.3 (*)    GFR calc non Af Amer 19 (*)    GFR calc Af Amer 22 (*)    Anion gap 22 (*)    All other components within normal limits  CBC WITH DIFFERENTIAL/PLATELET - Abnormal; Notable for the following:    WBC 22.8 (*)    RBC 3.90 (*)    Hemoglobin 10.3 (*)    HCT 32.0 (*)    Neutro Abs 18.7 (*)    Monocytes Absolute 1.4 (*)    All other components within normal limits    PROTIME-INR - Abnormal; Notable for the following:    Prothrombin Time 17.3 (*)    All other components within normal limits  LACTIC ACID, PLASMA - Abnormal; Notable for the following:    Lactic Acid, Venous 2.8 (*)    All other components within normal limits  CBC - Abnormal; Notable for the following:    WBC 17.1 (*)    RBC 3.85 (*)    Hemoglobin 10.5 (*)    HCT 31.6 (*)    All other components within normal limits  CBC - Abnormal; Notable for the following:    WBC 16.2 (*)    RBC 3.68 (*)    Hemoglobin 9.9 (*)    HCT 30.0 (*)    All other components within normal limits  AMMONIA - Abnormal; Notable for the following:    Ammonia 37 (*)    All other components within normal limits  GLUCOSE, CAPILLARY - Abnormal; Notable for the following:    Glucose-Capillary 201 (*)    All other components within normal limits  GLUCOSE, CAPILLARY - Abnormal; Notable for the following:    Glucose-Capillary 243 (*)    All other components within normal limits  HEMOGLOBIN AND HEMATOCRIT, BLOOD - Abnormal; Notable for the following:    Hemoglobin 10.2 (*)    HCT 31.6 (*)    All other components within normal limits  GLUCOSE, CAPILLARY - Abnormal; Notable for the following:    Glucose-Capillary 228 (*)    All other components within normal limits  HEMOGLOBIN AND HEMATOCRIT, BLOOD - Abnormal; Notable for the following:    Hemoglobin 10.1 (*)    HCT 30.6 (*)  All other components within normal limits  GLUCOSE, CAPILLARY - Abnormal; Notable for the following:    Glucose-Capillary 293 (*)    All other components within normal limits  GLUCOSE, CAPILLARY - Abnormal; Notable for the following:    Glucose-Capillary 425 (*)    All other components within normal limits  GLUCOSE, CAPILLARY - Abnormal; Notable for the following:    Glucose-Capillary 482 (*)    All other components within normal limits  I-STAT CG4 LACTIC ACID, ED - Abnormal; Notable for the following:    Lactic Acid, Venous 8.57  (*)    All other components within normal limits  POC OCCULT BLOOD, ED - Abnormal; Notable for the following:    Fecal Occult Bld POSITIVE (*)    All other components within normal limits  MRSA PCR SCREENING  LACTIC ACID, PLASMA  LACTIC ACID, PLASMA  URINALYSIS, ROUTINE W REFLEX MICROSCOPIC  BASIC METABOLIC PANEL  MAGNESIUM  HEMOGLOBIN AND HEMATOCRIT, BLOOD  I-STAT TROPOININ, ED  TYPE AND SCREEN  PREPARE RBC (CROSSMATCH)  ABO/RH    EKG  EKG Interpretation  Date/Time:  Saturday November 21 2016 16:04:49 EST Ventricular Rate:  161 PR Interval:    QRS Duration: 88 QT Interval:  294 QTC Calculation: 481 R Axis:   80 Text Interpretation:  Atrial fibrillation with rapid ventricular response with occasional ventricular-paced complexes ST & T wave abnormality, consider inferior ischemia ST & T wave abnormality, consider anterolateral ischemia Abnormal ECG history of atrial fibrillation, now in RVR with ST changes inerolaterally Confirmed by LIU MD, Annabelle Harman [phone]) on 11/22/2016 1:30:11 PM       Radiology Dg Chest Portable 1 View  Result Date: 11/21/2016 CLINICAL DATA:  Pt with weight gain this AM of 2 lbs, pt has increased SOB. Hx CHF, COPD, CAD, afib EXAM: PORTABLE CHEST 1 VIEW COMPARISON:  10/13/2016 FINDINGS: Status post median sternotomy and CABG. Heart size is normal. Lungs are clear. No pulmonary edema. Degenerative changes are seen in thoracic spine. IMPRESSION: No evidence for acute  abnormality. Electronically Signed   By: Norva Pavlov M.D.   On: 11/21/2016 17:03    Procedures Procedures (including critical care time)  Medications Ordered in ED Medications  mirtazapine (REMERON) tablet 15 mg (15 mg Oral Given 11/22/16 2200)  albuterol (PROVENTIL) (2.5 MG/3ML) 0.083% nebulizer solution 2.5 mg (not administered)  pantoprazole (PROTONIX) 80 mg in sodium chloride 0.9 % 250 mL (0.32 mg/mL) infusion (8 mg/hr Intravenous Rate/Dose Verify 11/22/16 2300)  pantoprazole  (PROTONIX) injection 40 mg (not administered)  diltiazem (CARDIZEM) 100 mg in dextrose 5 % 100 mL (1 mg/mL) infusion (5 mg/hr Intravenous Rate/Dose Verify 11/22/16 2300)  methylPREDNISolone sodium succinate (SOLU-MEDROL) 40 mg/mL injection 20 mg (20 mg Intravenous Given 11/22/16 2200)  fentaNYL (SUBLIMAZE) injection 25-50 mcg (25 mcg Intravenous Given 11/22/16 1328)  ipratropium-albuterol (DUONEB) 0.5-2.5 (3) MG/3ML nebulizer solution 3 mL (3 mLs Nebulization Given 11/22/16 2204)  insulin aspart (novoLOG) injection 0-20 Units (20 Units Subcutaneous Given 11/22/16 2000)  diltiazem (CARDIZEM) injection 10 mg (5 mg Intravenous Given 11/21/16 1623)  diltiazem (CARDIZEM) 100 mg in dextrose 5 % 100 mL (1 mg/mL) infusion (0 mg/hr Intravenous Stopped 11/21/16 2224)  potassium chloride SA (K-DUR,KLOR-CON) CR tablet 40 mEq (40 mEq Oral Given 11/21/16 2158)  pantoprazole (PROTONIX) 80 mg in sodium chloride 0.9 % 100 mL IVPB (0 mg Intravenous Stopped 11/21/16 2224)  pantoprazole (PROTONIX) injection 40 mg (40 mg Intravenous Given 11/21/16 1858)     Initial Impression / Assessment and Plan / ED Course  I have reviewed the triage vital signs and the nursing notes.  Pertinent labs & imaging results that were available during my care of the patient were reviewed by me and considered in my medical decision making (see chart for details).  Clinical Course     Patient is an 82 year old male with EF of 20% recently started on Eloquis. He is presenting today with feeling poorly for the last several days. Patient's lactic is 9. Patient tachycardic to 180 with A. fib. Height hypotensive to 90/40. Had a single episode of melanotic stool.  Patient recognizes and currently sick. Initial dose of 5 with diltiazem and given which lowered patient heart rate to the 120s and improve his blood pressure to 110/60. Patient's examined and found to have melanotic stool. Type and screen sent. After melanotic stool is found,  alerted that typed blood available in 6 minutes.  Patient then found to be febrile. We'll give vanc zosyn.  Concern for mesetenteric sichemia? Vs bleed from eloquis.   Discussed code status, patietn wishes to be full code.     5:50 PM Crit care consulted.   Will admit.  GI consulted.    CRITICAL CARE Performed by: Arlana Hove Total critical care time:  75 minutes Critical care time was exclusive of separately billable procedures and treating other patients. Critical care was necessary to treat or prevent imminent or life-threatening deterioration. Critical care was time spent personally by me on the following activities: development of treatment plan with patient and/or surrogate as well as nursing, discussions with consultants, evaluation of patient's response to treatment, examination of patient, obtaining history from patient or surrogate, ordering and performing treatments and interventions, ordering and review of laboratory studies, ordering and review of radiographic studies, pulse oximetry and re-evaluation of patient's condition.     Final Clinical Impressions(s) / ED Diagnoses   Final diagnoses:  Melena    New Prescriptions Current Discharge Medication List       Jeremian Whitby Randall An, MD 11/22/16 2339

## 2016-11-21 NOTE — Consult Note (Signed)
[facility] Consult Note   History Devin Heath MRN # [id number]  Date of Admission: 11/21/2016 Date of Consultation: 11/21/2016 Referring physician: Dr. Leslye Peer, MD  Reason for Consultation/Chief Complaint: Melena and anemia  Subjective  HPI:  This is an 82 year old man brought by family to the ED this afternoon for weakness. He has multiple medical problems as outlined below, he was found to be in rapid A. fib with low blood pressure and then he passed amount of melena. He was anemic with hemoglobin of 10.2, which is more than a gram below his recent outpatient labs. He is on a Cardizem drip to control his A. fib and his blood pressure is currently 135/70 after half a unit of PRBCs and IV fluids. He is still in rapid A. fib with a heart rate of 120. Devin Heath is a poor historian since he is lethargic right now. His son can is at the bedside and give some additional history. I also read Dr. Tedra Senegal recent cardiology office note.  Devin Heath is no history of GI bleeding, he has not been on aspirin for many years, he denies NSAID use. He was recently started on Eliquis after his initial office visit with Dr. Shirlee Latch on 11/09/2016. He denies hematemesis, abdominal pain, chest pain or dyspnea.  ROS:  Constitutional:  There's been no recent weight loss   All other systems are negative except as noted above in the HPI, though history is limited by the patient's current condition and mental status  Past Medical History Past Medical History:  Diagnosis Date  . A-fib (HCC)   . Arthritis   . CAD (coronary artery disease)   . COPD (chronic obstructive pulmonary disease) (HCC)   . Diabetes (HCC)   . Former smoker   . Gout   . Hard of hearing   . Heart failure (HCC)   . Hyperlipidemia   . IBS (irritable bowel syndrome)   . Ischemic cardiomyopathy   . On home oxygen therapy    "2L q hs" (10/13/2016)  . OSA on CPAP   . Renal insufficiency   .  Rheumatoid arthritis (HCC)   . RLS (restless legs syndrome)   . Vitamin D deficiency    His CABG was in 2010, anatomy is unknown His son says that he has not tolerated sedation well in the past, and has then required mechanical ventilation. He is not clear the circumstances in which this occurred. Stage IV CK D  While the patient apparently had a low ejection fraction of 20% at the time of his CABG, his LVEF was 55-60% on echocardiogram last month. Past Surgical History Past Surgical History:  Procedure Laterality Date  . APPENDECTOMY    . CARDIAC SURGERY     BY PASS  . FINGER AMPUTATION Left    RING FINGER  . NECK SURGERY    . TONSILLECTOMY      Family History Family History  Problem Relation Age of Onset  . CAD Mother   . CAD Father     Social History Social History   Social History  . Marital status: Married    Spouse name: JEAN  . Number of children: 3  . Years of education: COLLEGE   Occupational History  . RETIRED    Social History Main Topics  . Smoking status: Former Games developer  . Smokeless tobacco: Never Used  . Alcohol use No  . Drug use: No  . Sexual activity: Not on file   Other Topics  Concern  . Not on file   Social History Narrative  . No narrative on file  He denies alcohol use.  Allergies Allergies  Allergen Reactions  . Ace Inhibitors Cough  . Ambrisentan Other (See Comments)  . Clindamycin/Lincomycin Other (See Comments)  . Erythromycin Other (See Comments)  . Feldene [Piroxicam] Other (See Comments)  . Keflex [Cephalexin] Other (See Comments)  . Omnicef [Cefdinir] Other (See Comments)  . Simvastatin Other (See Comments)    Outpatient Meds Home medications from the H+P and/or nursing med reconciliation reviewed.  Inpatient med list reviewed  _____________________________________________________________________ Objective   Exam:  Current vital signs  Patient Vitals for the past 8 hrs:  BP Temp Temp src Pulse Resp SpO2   11/21/16 1930 120/60 100.7 F (38.2 C) Oral 79 19 97 %  11/21/16 1915 125/67 - - (!) 140 22 96 %  11/21/16 1900 127/63 - - 94 21 95 %  11/21/16 1845 120/56 - - 69 20 94 %  11/21/16 1830 118/58 - - 115 21 95 %  11/21/16 1815 (!) 106/53 - - 103 24 94 %  11/21/16 1800 (!) 99/54 - - - 17 -  11/21/16 1749 - 98.7 F (37.1 C) Oral - - -  11/21/16 1745 (!) 114/51 - - 110 20 97 %  11/21/16 1730 (!) 97/52 - - (!) 126 24 96 %  11/21/16 1715 - - - (!) 128 18 96 %  11/21/16 1714 (!) 106/51 100.3 F (37.9 C) Oral (!) 140 16 99 %  11/21/16 1700 104/67 - - (!) 134 24 96 %  11/21/16 1658 (!) 131/104 102.1 F (38.9 C) Oral (!) 129 19 96 %  11/21/16 1645 (!) 131/104 - - (!) 133 22 97 %  11/21/16 1630 - - - (!) 129 15 94 %  11/21/16 1615 - - - 92 18 97 %  11/21/16 1601 - - - - - 96 %  11/21/16 1600 106/71 98.5 F (36.9 C) Oral (!) 180 (!) 28 98 %    Intake/Output Summary (Last 24 hours) at 11/21/16 1957 Last data filed at 11/21/16 1955  Gross per 24 hour  Intake             1085 ml  Output                0 ml  Net             1085 ml    Physical Exam:    General: this is a Somnolent, chronically ill-appearing elderly patient  Eyes: sclera anicteric, no redness, no conjunctival pallor  ENT: oral mucosa moist without lesions, no cervical or supraclavicular lymphadenopathy, good dentition  CV: Irregular and tachycardic with a soft systolic murmur , no JVD,, no peripheral edema. His feet are warm and well perfused, which is apparently improved from about 2 hours ago.  Resp: clear to auscultation bilaterally, normal RR and effort noted  GI: soft, obese, no tenderness, with active bowel sounds. No guarding or palpable organomegaly noted  Skin; warm and dry, no rash or jaundice noted, he is pale  Neuro: Somnolent, which limits exam. Normal gross motor function and slow speech.  Labs:   Recent Labs Lab 11/21/16 1617  WBC 22.8*  HGB 10.3*  HCT 32.0*  PLT 218    Recent Labs Lab  11/21/16 1617  NA 132*  K 3.1*  CL 89*  CO2 21*  BUN 51*  ALBUMIN 3.3*  ALKPHOS 70  ALT 17  AST 28  GLUCOSE 344*   BMP Latest Ref Rng & Units 11/21/2016 11/09/2016 10/19/2016  Glucose 65 - 99 mg/dL 502(D) 741(O) 878(M)  BUN 6 - 20 mg/dL 76(H) 20(N) 47(S)  Creatinine 0.61 - 1.24 mg/dL 9.62(E) 3.66(Q) 9.47(M)  Sodium 135 - 145 mmol/L 132(L) 137 137  Potassium 3.5 - 5.1 mmol/L 3.1(L) 4.2 4.5  Chloride 101 - 111 mmol/L 89(L) 99(L) 94(L)  CO2 22 - 32 mmol/L 21(L) 25 30  Calcium 8.9 - 10.3 mg/dL 5.4(Y) 9.1 9.0     Recent Labs Lab 11/21/16 1617  INR 1.40   Lactate 8.5  Radiologic studies: Total chest x-ray today: Normal heart size, no pleural effusion or pulmonary edema  @ASSESSMENTPLANBEGIN @ Impression:  Melena indicative of upper GI bleed Anemia of acute GI blood loss Rapid A. fib with hypotension making the patient on stable upon admission. He is improved somewhat during his ER visit. COPD requiring oxygen at night Chronic kidney disease Chronic anticoagulation therapy exacerbating the GI bleed  While he is critically ill and will be appropriately admitted to the ICU, he is somewhat more stable during history pending the EGD. He still has signs of end organ damage with acute on chronic renal failure and lactic acidosis. I believe the lactate is from hypotension and poor tissue perfusion rather than gut ischemia because he has no abdominal pain and has good bowel sounds.  Plan:  He has to functioning IV lines right now. He has received 40 mg of IV Protonix once. After his current unit of PRBCs is finished transfusing, we will minister an additional bolus and started infusion at 8 mg per hour  Serial hemoglobin and hematocrit and transfuse as needed  Nothing by mouth  Upper endoscopy will be performed when appropriate. We need some time for the effect of his OAC to diminish, which may take longer than usual given his chronic kidney disease. Acid suppression is  necessary for adequate hemostasis as well. I have addressed his sons concerns regarding the sedation and we will certainly proceed cautiously when it is time to perform his EGD. It might require the support of the anesthesia service depending upon his clinical condition. Patient at increased risk for cardiopulmonary complications of procedure due to medical comorbidities.   I will discuss with critical care the utility of [facility] at this juncture..  Thank you for the courtesy of this consult.  Please contact me with any questions or concerns.  III Pager: [phone] Mon-Fri 8a-5p [phone] after 5p, weekends, holidays

## 2016-11-21 NOTE — ED Notes (Signed)
Re-paged [facility] x2 to [phone]

## 2016-11-21 NOTE — Progress Notes (Addendum)
Pharmacy Antibiotic Note  Devin Heath is a 82 y.o. male admitted on 11/21/2016 with sepsis.  Pharmacy has been consulted for vancomycin and levaquin dosing.  Plan: Vancomycin 1250mg  IV every 24 hours.  Goal trough 15-20 mcg/mL.  Levaquin 500mg  IV q48h Monitor culture data, renal function and clinical course VT at SS prn     Temp (24hrs), Avg:98.5 F (36.9 C), Min:98.5 F (36.9 C), Max:98.5 F (36.9 C)   Recent Labs Lab 11/21/16 1617 11/21/16 1627  WBC 22.8*  --   CREATININE 2.82*  --   LATICACIDVEN  --  8.57*    Estimated Creatinine Clearance: 32.1 mL/min (by C-G formula based on SCr of 1.76 mg/dL (H)).    Allergies  Allergen Reactions  . Ace Inhibitors Cough  . Ambrisentan   . Clindamycin/Lincomycin   . Erythromycin   . Feldene [Piroxicam]   . Keflex [Cephalexin]   . Omnicef [Cefdinir]   . Simvastatin      11/23/16. 11/23/16, PharmD, BCPS Clinical Pharmacist Pager [phone] 11/21/2016 4:50 PM

## 2016-11-21 NOTE — ED Notes (Addendum)
ED Provider at bedside updating family and establishing pt code status

## 2016-11-21 NOTE — Progress Notes (Signed)
eLink Physician-Brief Progress Note Patient Name: Devin Heath DOB: Aug 17, 1929 MRN: [id number]   Date of Service  11/21/2016  HPI/Events of Note  Cardizem IV infusion order for AFIB needs to be renewed.   eICU Interventions  Will order: 1. Cardizem IV infusion. Titrate to HR 65-105.     Intervention Category Major Interventions: Arrhythmia - evaluation and management  Sommer,Steven Eugene 11/21/2016, 10:41 PM

## 2016-11-22 ENCOUNTER — Encounter (HOSPITAL_COMMUNITY): Payer: Self-pay | Admitting: *Deleted

## 2016-11-22 DIAGNOSIS — Z7901 Long term (current) use of anticoagulants: Secondary | ICD-10-CM

## 2016-11-22 LAB — GLUCOSE, CAPILLARY
GLUCOSE-CAPILLARY: 243 mg/dL — AB (ref 65–99)
GLUCOSE-CAPILLARY: 425 mg/dL — AB (ref 65–99)
Glucose-Capillary: 228 mg/dL — ABNORMAL HIGH (ref 65–99)
Glucose-Capillary: 293 mg/dL — ABNORMAL HIGH (ref 65–99)
Glucose-Capillary: 482 mg/dL — ABNORMAL HIGH (ref 65–99)

## 2016-11-22 LAB — HEMOGLOBIN AND HEMATOCRIT, BLOOD
HCT: 31.6 % — ABNORMAL LOW (ref 39.0–52.0)
HEMATOCRIT: 30.6 % — AB (ref 39.0–52.0)
HEMOGLOBIN: 10.1 g/dL — AB (ref 13.0–17.0)
Hemoglobin: 10.2 g/dL — ABNORMAL LOW (ref 13.0–17.0)

## 2016-11-22 LAB — CBC
HCT: 30 % — ABNORMAL LOW (ref 39.0–52.0)
Hemoglobin: 9.9 g/dL — ABNORMAL LOW (ref 13.0–17.0)
MCH: 26.9 pg (ref 26.0–34.0)
MCHC: 33 g/dL (ref 30.0–36.0)
MCV: 81.5 fL (ref 78.0–100.0)
PLATELETS: 154 10*3/uL (ref 150–400)
RBC: 3.68 MIL/uL — ABNORMAL LOW (ref 4.22–5.81)
RDW: 14.9 % (ref 11.5–15.5)
WBC: 16.2 10*3/uL — AB (ref 4.0–10.5)

## 2016-11-22 LAB — LACTIC ACID, PLASMA: LACTIC ACID, VENOUS: 1.3 mmol/L (ref 0.5–1.9)

## 2016-11-22 MED ORDER — INSULIN GLARGINE 100 UNIT/ML ~~LOC~~ SOLN
10.0000 [IU] | SUBCUTANEOUS | Status: AC
  Administered 2016-11-23: 10 [IU] via SUBCUTANEOUS
  Filled 2016-11-22: qty 0.1

## 2016-11-22 MED ORDER — FENTANYL CITRATE (PF) 100 MCG/2ML IJ SOLN
25.0000 ug | INTRAMUSCULAR | Status: DC | PRN
  Administered 2016-11-22 – 2016-11-26 (×3): 25 ug via INTRAVENOUS
  Filled 2016-11-22 (×3): qty 2

## 2016-11-22 MED ORDER — METHYLPREDNISOLONE SODIUM SUCC 40 MG IJ SOLR
20.0000 mg | Freq: Two times a day (BID) | INTRAMUSCULAR | Status: DC
  Administered 2016-11-22 – 2016-11-24 (×5): 20 mg via INTRAVENOUS
  Filled 2016-11-22 (×6): qty 1

## 2016-11-22 MED ORDER — INSULIN ASPART 100 UNIT/ML ~~LOC~~ SOLN
0.0000 [IU] | SUBCUTANEOUS | Status: DC
  Administered 2016-11-22: 20 [IU] via SUBCUTANEOUS
  Administered 2016-11-22: 11 [IU] via SUBCUTANEOUS
  Administered 2016-11-22: 7 [IU] via SUBCUTANEOUS
  Administered 2016-11-23: 11 [IU] via SUBCUTANEOUS
  Administered 2016-11-23: 7 [IU] via SUBCUTANEOUS
  Administered 2016-11-23: 10 [IU] via SUBCUTANEOUS

## 2016-11-22 MED ORDER — IPRATROPIUM-ALBUTEROL 0.5-2.5 (3) MG/3ML IN SOLN
3.0000 mL | Freq: Four times a day (QID) | RESPIRATORY_TRACT | Status: DC
  Administered 2016-11-22 – 2016-11-23 (×4): 3 mL via RESPIRATORY_TRACT
  Filled 2016-11-22 (×4): qty 3

## 2016-11-22 NOTE — Progress Notes (Signed)
PCCM Progress Note  Admission date: 11/21/2016 Referring provider: Dr. Corlis Leak  CC: Rectal bleeding  HPI: 82 yo male former smoker with weakness and had melanotic stool.  Noted to have A fib with RVR, lactic acidosis.    PMHx A fib on eliquis, CAD, combined CHF, Stage IV CKD, COPD on home oxygen, OSA, DM, Gout, HLD, IBS, RA, RLS, ACE cough  Subjective: Denies abdominal pain.  C/o pain in his feet and ankles from his gout.  Vital signs: BP (!) 119/54 (BP Location: Right Arm)   Pulse 88   Temp 98.2 F (36.8 C) (Oral)   Resp (!) 22   Ht 5\' 7"  (1.702 m)   Wt 187 lb 6.3 oz (85 kg)   SpO2 97%   BMI 29.35 kg/m   Intake/output: I/O last 3 completed shifts: In: 1498.6 [P.O.:120; I.V.:293.6; Blood:335; IV Piggyback:750] Out: 375 [Urine:375]  General: alert Neuro: normal strength HEENT: no stridor Cardiac: irregular, no murmur Chest: no wheeze Abd: soft, non tender, + bowel sounds Ext: ankle edema, tender on palpation Skin: no rashes   CMP Latest Ref Rng & Units 11/21/2016 11/09/2016 10/19/2016  Glucose 65 - 99 mg/dL 10/21/2016) 778(E) 423(N)  BUN 6 - 20 mg/dL 361(W) 43(X) 54(M)  Creatinine 0.61 - 1.24 mg/dL 08(Q) 7.61(P) 5.09(T)  Sodium 135 - 145 mmol/L 132(L) 137 137  Potassium 3.5 - 5.1 mmol/L 3.1(L) 4.2 4.5  Chloride 101 - 111 mmol/L 89(L) 99(L) 94(L)  CO2 22 - 32 mmol/L 21(L) 25 30  Calcium 8.9 - 10.3 mg/dL 2.67(T) 9.1 9.0  Total Protein 6.5 - 8.1 g/dL 6.3(L) - -  Total Bilirubin 0.3 - 1.2 mg/dL 1.2 - -  Alkaline Phos 38 - 126 U/L 70 - -  AST 15 - 41 U/L 28 - -  ALT 17 - 63 U/L 17 - -    CBC Latest Ref Rng & Units 11/22/2016 11/21/2016 11/21/2016  WBC 4.0 - 10.5 K/uL 16.2(H) 17.1(H) 22.8(H)  Hemoglobin 13.0 - 17.0 g/dL 11/23/2016) 10.5(L) 10.3(L)  Hematocrit 39.0 - 52.0 % 30.0(L) 31.6(L) 32.0(L)  Platelets 150 - 400 K/uL 154 161 218    CBG (last 3)   Recent Labs  11/21/16 2123 11/22/16 0821  GLUCAP 201* 243*     Imaging: Dg Chest Portable 1 View  Result  Date: 11/21/2016 CLINICAL DATA:  Pt with weight gain this AM of 2 lbs, pt has increased SOB. Hx CHF, COPD, CAD, afib EXAM: PORTABLE CHEST 1 VIEW COMPARISON:  10/13/2016 FINDINGS: Status post median sternotomy and CABG. Heart size is normal. Lungs are clear. No pulmonary edema. Degenerative changes are seen in thoracic spine. IMPRESSION: No evidence for acute  abnormality. Electronically Signed   By: 10/15/2016 M.D.   On: 11/21/2016 17:03    Studies:  Events: 12/30 Admit, PRBC transfusion, GI consulted  Summary: 82 yo male with GI bleed, A fib with RVR, lactic acidosis.  Assessment/plan:  GI bleed >> likely upper. - continue protonix - GI assessing for EGD  Hx of COPD on home oxygen, OSA on CPAP qhs. - oxygen to keep SpO2 90 to 95% - CPAP qhs - Scheduled BDs  A fib with RVR in setting of GI bleed. Hx of CAD, chronic combined CHF, A fib, HLD. - hold anticoagulation - cardizem gtt for rate control  CKD 4. - monitor renal function urine output  DM type II. - SSI  Hx of gout on prednisone as outpt. - solumedrol - prn fentanyl  Deconditioning. - PT/OT when more stable  Resolved problems >> lactic acidosis  DVT prophylaxis - SCDs SUP - protonix Nutrition - NPO Goals of care - DNR/DNI  Transfer to SDU 12/31 >> to [facility] 1/01 and PCCM off  Coralyn Helling, MD [facility] Pulmonary/Critical Care 11/22/2016, 9:05 AM Pager:  [phone] After 3pm call: [phone]

## 2016-11-22 NOTE — Progress Notes (Signed)
eLink Physician-Brief Progress Note Patient Name: Devin Heath DOB: 22-May-1929 MRN: [id number]   Date of Service  11/22/2016  HPI/Events of Note  Pt's CBG has Increasing throughout the day. Was in the 200s earlier, now at 400 mg percent.   Creatinine is higher than baseline.  He is on liquid diet.  Recent GI bleed.   He takes Lantus, 40 units at bedtime as well as insulin 15 units with meal as well as sliding scale.    eICU Interventions  Will give 10 units Lantus now as well as 10 units NovoLog.  Check CBG in 2 hours.  Day rounder  to assess insulin requirements in am     Intervention Category Intermediate Interventions: Other:  Daneen Schick Dios 11/22/2016, 11:40 PM

## 2016-11-22 NOTE — Progress Notes (Signed)
[place] [facility] Progress Note  Chief Complaint: melena  Subjective  History:  No further melena reported since admission last night. Denies abd pain or hematemesis. Hgb remains stable after 1 unit PRBCs last night.  ROS: Cardiovascular:  no chest pain Respiratory: no dyspnea  Objective:  Med list reviewed  Vital signs in last 24 hrs: Vitals:   11/22/16 1215 11/22/16 1300  BP: 135/69 (!) 115/54  Pulse: 87 92  Resp: (!) 22 20  Temp: 99.3 F (37.4 C)     Physical Exam Chronically ill elderly man  HEENT: sclera anicteric, oral mucosa moist without lesions  Neck: supple, no thyromegaly, JVD or lymphadenopathy  Cardiac: irregular without murmurs, S1S2 heard, no peripheral edema  Pulm: clear to auscultation bilaterally, normal RR and effort noted  Abdomen: soft, no tenderness, with active bowel sounds. No guarding or palpable hepatosplenomegaly  Skin; warm and dry, no jaundice or rash  Recent Labs:   Recent Labs Lab 11/21/16 1617 11/21/16 2154 11/22/16 0432 11/22/16 1212  WBC 22.8* 17.1* 16.2*  --   HGB 10.3* 10.5* 9.9* 10.2*  HCT 32.0* 31.6* 30.0* 31.6*  PLT 218 161 154  --     Recent Labs Lab 11/21/16 1617  NA 132*  K 3.1*  CL 89*  CO2 21*  BUN 51*  ALBUMIN 3.3*  ALKPHOS 70  ALT 17  AST 28  GLUCOSE 344*    Recent Labs Lab 11/21/16 1617  INR 1.40   Lactate normalized   @ASSESSMENTPLANBEGIN @ Assessment: Melena, stopped Acute blood loss anemia - stable A fib with RVR, now rate controlled.   Probable UGI source.  He is only a little over 24 hrs from his last OAC dose.   Plan: Change protonix drip to 40 mg IV twice daily Full liquid diet Q 12 hr Hgb/HCt EGD on Jan 2nd unless needed urgently before that.  He needs anesthesia support for COPD and reported history of poorly tolerating sedation. Also, impaired renal fxn might mean delayed clearance of OAC, which would complicate endoscopic intervention if necessary.   Jan 4  III Pager [phone] Mon-Fri 8a-5p [phone] after 5p, weekends, holidays

## 2016-11-23 LAB — BASIC METABOLIC PANEL
Anion gap: 12 (ref 5–15)
Anion gap: 16 — ABNORMAL HIGH (ref 5–15)
BUN: 53 mg/dL — ABNORMAL HIGH (ref 6–20)
BUN: 60 mg/dL — ABNORMAL HIGH (ref 6–20)
CHLORIDE: 97 mmol/L — AB (ref 101–111)
CO2: 27 mmol/L (ref 22–32)
CO2: 22 mmol/L (ref 22–32)
CREATININE: 2.26 mg/dL — AB (ref 0.61–1.24)
Calcium: 8.8 mg/dL — ABNORMAL LOW (ref 8.9–10.3)
Calcium: 8.7 mg/dL — ABNORMAL LOW (ref 8.9–10.3)
Chloride: 89 mmol/L — ABNORMAL LOW (ref 101–111)
Creatinine, Ser: 2.45 mg/dL — ABNORMAL HIGH (ref 0.61–1.24)
GFR calc Af Amer: 26 mL/min — ABNORMAL LOW (ref >60)
GFR calc non Af Amer: 25 mL/min — ABNORMAL LOW (ref >60)
GFR calc non Af Amer: 22 mL/min — ABNORMAL LOW (ref >60)
GFR, EST AFRICAN AMERICAN: 29 mL/min — AB (ref >60)
Glucose, Bld: 224 mg/dL — ABNORMAL HIGH (ref 65–99)
Glucose, Bld: 616 mg/dL (ref 65–99)
POTASSIUM: 3.4 mmol/L — AB (ref 3.5–5.1)
Potassium: 4.7 mmol/L (ref 3.5–5.1)
Sodium: 136 mmol/L (ref 135–145)
Sodium: 127 mmol/L — ABNORMAL LOW (ref 135–145)

## 2016-11-23 LAB — GLUCOSE, CAPILLARY
GLUCOSE-CAPILLARY: 280 mg/dL — AB (ref 65–99)
GLUCOSE-CAPILLARY: 224 mg/dL — AB (ref 65–99)
GLUCOSE-CAPILLARY: 558 mg/dL — AB (ref 65–99)
GLUCOSE-CAPILLARY: 553 mg/dL — AB (ref 65–99)
Glucose-Capillary: 466 mg/dL — ABNORMAL HIGH (ref 65–99)
Glucose-Capillary: 497 mg/dL — ABNORMAL HIGH (ref 65–99)
Glucose-Capillary: 553 mg/dL (ref 65–99)

## 2016-11-23 LAB — GLUCOSE, RANDOM
GLUCOSE: 494 mg/dL — AB (ref 65–99)
GLUCOSE: 578 mg/dL — AB (ref 65–99)

## 2016-11-23 LAB — MAGNESIUM: Magnesium: 2.4 mg/dL (ref 1.7–2.4)

## 2016-11-23 LAB — BLOOD PRODUCT ORDER (VERBAL) VERIFICATION

## 2016-11-23 LAB — HEMOGLOBIN AND HEMATOCRIT, BLOOD
HCT: 27.3 % — ABNORMAL LOW (ref 39.0–52.0)
HEMATOCRIT: 28.5 % — AB (ref 39.0–52.0)
HEMOGLOBIN: 9.4 g/dL — AB (ref 13.0–17.0)
Hemoglobin: 9 g/dL — ABNORMAL LOW (ref 13.0–17.0)

## 2016-11-23 MED ORDER — SODIUM CHLORIDE 0.9 % IV SOLN
INTRAVENOUS | Status: DC
  Administered 2016-11-23: 23:00:00 via INTRAVENOUS

## 2016-11-23 MED ORDER — IPRATROPIUM-ALBUTEROL 0.5-2.5 (3) MG/3ML IN SOLN
3.0000 mL | Freq: Two times a day (BID) | RESPIRATORY_TRACT | Status: DC
  Administered 2016-11-24 – 2016-11-25 (×2): 3 mL via RESPIRATORY_TRACT
  Filled 2016-11-23 (×3): qty 3

## 2016-11-23 MED ORDER — SODIUM CHLORIDE 0.9 % IV SOLN: INTRAVENOUS | Status: DC

## 2016-11-23 MED ORDER — SODIUM CHLORIDE 0.9 % IV BOLUS (SEPSIS)
500.0000 mL | Freq: Once | INTRAVENOUS | Status: AC
  Administered 2016-11-23: 500 mL via INTRAVENOUS

## 2016-11-23 MED ORDER — SODIUM CHLORIDE 0.9 % IV SOLN
30.0000 meq | Freq: Once | INTRAVENOUS | Status: AC
  Administered 2016-11-23: 30 meq via INTRAVENOUS
  Filled 2016-11-23: qty 15

## 2016-11-23 MED ORDER — PANTOPRAZOLE SODIUM 40 MG PO TBEC
40.0000 mg | DELAYED_RELEASE_TABLET | Freq: Two times a day (BID) | ORAL | Status: DC
  Administered 2016-11-23 – 2016-11-26 (×4): 40 mg via ORAL
  Filled 2016-11-23 (×4): qty 1

## 2016-11-23 MED ORDER — INSULIN GLARGINE 100 UNIT/ML ~~LOC~~ SOLN
40.0000 [IU] | Freq: Every day | SUBCUTANEOUS | Status: DC
  Administered 2016-11-23: 40 [IU] via SUBCUTANEOUS
  Filled 2016-11-23: qty 0.4

## 2016-11-23 MED ORDER — INSULIN ASPART 100 UNIT/ML ~~LOC~~ SOLN
15.0000 [IU] | Freq: Once | SUBCUTANEOUS | Status: AC
  Administered 2016-11-23: 15 [IU] via SUBCUTANEOUS

## 2016-11-23 MED ORDER — DEXTROSE-NACL 5-0.45 % IV SOLN
INTRAVENOUS | Status: DC
  Administered 2016-11-24: 05:00:00 via INTRAVENOUS

## 2016-11-23 MED ORDER — SODIUM CHLORIDE 0.9 % IV SOLN
INTRAVENOUS | Status: DC
  Administered 2016-11-23: 4.9 [IU]/h via INTRAVENOUS
  Filled 2016-11-23: qty 2.5

## 2016-11-23 MED ORDER — PANTOPRAZOLE SODIUM 40 MG PO TBEC: 40.0000 mg | DELAYED_RELEASE_TABLET | Freq: Two times a day (BID) | ORAL | Status: DC

## 2016-11-23 MED ORDER — INSULIN GLARGINE 100 UNIT/ML ~~LOC~~ SOLN
20.0000 [IU] | Freq: Once | SUBCUTANEOUS | Status: AC
  Administered 2016-11-23: 20 [IU] via SUBCUTANEOUS
  Filled 2016-11-23: qty 0.2

## 2016-11-23 MED ORDER — INSULIN ASPART 100 UNIT/ML ~~LOC~~ SOLN
10.0000 [IU] | Freq: Once | SUBCUTANEOUS | Status: AC
  Administered 2016-11-23: 10 [IU] via SUBCUTANEOUS

## 2016-11-23 NOTE — Progress Notes (Signed)
[facility] Progress Note  Chief Complaint: melena  Subjective  History:  No reported melena since admission night before last He denies abd pain. Enjoying full liquid diet Last dose OAC was midday 12/30  ROS: Cardiovascular:  no chest pain Respiratory: no dyspnea  Objective:  Med list reviewed  Vital signs in last 24 hrs: Vitals:   11/23/16 0306 11/23/16 0804  BP: (!) 131/59 (!) 137/57  Pulse: 86 86  Resp: (!) 24 19  Temp: 98.3 F (36.8 C) 98.2 F (36.8 C)    Physical Exam   HEENT: sclera anicteric, oral mucosa moist without lesions  Neck: supple, no thyromegaly, JVD or lymphadenopathy  Cardiac: irregular without murmurs, S1S2 heard, no peripheral edema (SCDs in place)  Pulm: clear to auscultation bilaterally, normal RR and effort noted  Abdomen: soft, obese, no tenderness, with active bowel sounds. No guarding or palpable hepatosplenomegaly  Skin; warm and dry, no jaundice or rash  Recent Labs:   Recent Labs Lab 11/21/16 1617 11/21/16 2154 11/22/16 0432 11/22/16 1212 11/22/16 1651 11/23/16 0606  WBC 22.8* 17.1* 16.2*  --   --   --   HGB 10.3* 10.5* 9.9* 10.2* 10.1* 9.4*  HCT 32.0* 31.6* 30.0* 31.6* 30.6* 28.5*  PLT 218 161 154  --   --   --     Recent Labs Lab 11/21/16 1617 11/23/16 0606  NA 132* 136  K 3.1* 3.4*  CL 89* 97*  CO2 21* 27  BUN 51* 53*  ALBUMIN 3.3*  --   ALKPHOS 70  --   ALT 17  --   AST 28  --   GLUCOSE 344* 224*    Recent Labs Lab 11/21/16 1617  INR 1.40    @ASSESSMENTPLANBEGIN @ Assessment:  Melena Acute blood loss anemia COPD Afib  His bleeding has stopped.  Plan: EGD tomorrow (will be with another [place] [facility] doc TBD) NPO after MN Hgb /Hct tonight and in AM Twice daily PPI   III Pager [phone] Mon-Fri 8a-5p [phone] after 5p, weekends, holidays

## 2016-11-23 NOTE — Progress Notes (Addendum)
[facility] PROGRESS NOTE  Devin Heath [facility]:[id number] DOB: [birth year] DOA: 11/21/2016   PCP: [name], MD     Assessment/Plan: Active Problems:   Lactic acidosis   GIB (gastrointestinal bleeding)   Melena   Acute blood loss anemia   Coronary artery disease involving coronary bypass graft of native heart without angina pectoris   Chronic anticoagulation   83 year old male w/ sig h/o AF, ICM  , stage IV CKD, COPD and OSA. F/b lebaeur Cards. Recently re-located from [place] to be closer to family due to worsening physical decline over the last year. Was just started on Eliquis (w/ CHADVASC 5). Per pt and family pt had been feeling weaker for the last 2 d PTA. On day of admit pt could not get OOB. Was incont. On arrival to ER had large melanic stool. HR 180s AF. Lactic acid was 8.6, initial Hgb 10.3 (baseline ~11).  He was treated w/ IV dilt, Typed,screened and transfused 1 u PRBC. PCCM asked to admit. [facility] transfer 11/23/16.    Assessment and plan Melena-resolved Acute blood loss anemia Seen by gastroenterology Source probably upper GI Anticoagulation on hold, on Protonix 40 twice a day Full liquid diet EGD on second of January Suspect delayed clearance of NOAC given renal insufficiency Hemoglobin at baseline is around 11.7, now 9.4   Chronic   diastolic CHF: LV EF preserved on 11/17 echo. LV EF: 60% -   65% Currently diuretics are on hold   Atrial fibrillation with rapid ventricular response Requiring Cardizem drip  Hypokalemia Hypokalemia-replete  Chronic kidney disease stage IV, baseline creatinine around 2.0  Diabetes mellitus Continue sliding scale insulin  COPD/obstructive sleep apnea, on home oxygen oxygen to keep SpO2 90 to 95% - CPAP qhs Currently on IV Solu-Medrol  Dyslipidemia  Diabetes-insulin-dependent. Uncontrolled CBG  Lantus and sliding scale insulin may need to transition to insulin drip   DVT prophylaxsis  SCDs  Code Status:  DO NOT RESUSCITATE     Family Communication: Discussed in detail with the patient, all imaging results, lab results explained to the patient   Disposition Plan:  Continue stepdown      Consultants:  Critical care  GI  Procedures:  None*  Antibiotics: Anti-infectives    Start     Dose/Rate Route Frequency Ordered Stop   11/21/16 1800  levofloxacin (LEVAQUIN) IVPB 500 mg  Status:  Discontinued     500 mg 100 mL/hr over 60 Minutes Intravenous Every 48 hours 11/21/16 1735 11/21/16 2033   11/21/16 1730  vancomycin (VANCOCIN) 1,250 mg in sodium chloride 0.9 % 250 mL IVPB  Status:  Discontinued     1,250 mg 166.7 mL/hr over 90 Minutes Intravenous Every 24 hours 11/21/16 1652 11/21/16 2033         HPI/Subjective: Per RN patient still having episodes of melena  Objective: Vitals:   11/22/16 2256 11/23/16 0306 11/23/16 0728 11/23/16 0804  BP: (!) 122/55 (!) 131/59  (!) 137/57  Pulse: 90 86  86  Resp: 19 (!) 24  19  Temp: 97.7 F (36.5 C) 98.3 F (36.8 C)  98.2 F (36.8 C)  TempSrc: Oral Oral  Oral  SpO2: 96% 99% 99% 95%  Weight:  87.9 kg (193 lb 12.6 oz)    Height:        Intake/Output Summary (Last 24 hours) at 11/23/16 0811 Last data filed at 11/23/16 0700  Gross per 24 hour  Intake  810 ml  Output              300 ml  Net              510 ml    Exam:  Examination:  General exam: Appears calm and comfortable  Respiratory system: Clear to auscultation. Respiratory effort normal. Cardiovascular system: S1 & S2 heard, RRR. No JVD, murmurs, rubs, gallops or clicks. No pedal edema. Gastrointestinal system: Abdomen is nondistended, soft and nontender. No organomegaly or masses felt. Normal bowel sounds heard. Central nervous system: Alert and oriented. No focal neurological deficits. Extremities: Symmetric 5 x 5 power. Skin: No rashes, lesions or ulcers Psychiatry: Judgement and insight appear normal. Mood & affect  appropriate.     Data Reviewed: I have personally reviewed following labs and imaging studies  Micro Results Recent Results (from the past 240 hour(s))  MRSA PCR Screening     Status: None   Collection Time: 11/21/16  8:32 PM  Result Value Ref Range Status   MRSA by PCR NEGATIVE NEGATIVE Final    Comment:        The GeneXpert MRSA Assay (FDA approved for NASAL specimens only), is one component of a comprehensive MRSA colonization surveillance program. It is not intended to diagnose MRSA infection nor to guide or monitor treatment for MRSA infections.     Radiology Reports Dg Chest Portable 1 View  Result Date: 11/21/2016 CLINICAL DATA:  Pt with weight gain this AM of 2 lbs, pt has increased SOB. Hx CHF, COPD, CAD, afib EXAM: PORTABLE CHEST 1 VIEW COMPARISON:  10/13/2016 FINDINGS: Status post median sternotomy and CABG. Heart size is normal. Lungs are clear. No pulmonary edema. Degenerative changes are seen in thoracic spine. IMPRESSION: No evidence for acute  abnormality. Electronically Signed   By: Norva Pavlov M.D.   On: 11/21/2016 17:03     CBC  Recent Labs Lab 11/21/16 1617 11/21/16 2154 11/22/16 0432 11/22/16 1212 11/22/16 1651 11/23/16 0606  WBC 22.8* 17.1* 16.2*  --   --   --   HGB 10.3* 10.5* 9.9* 10.2* 10.1* 9.4*  HCT 32.0* 31.6* 30.0* 31.6* 30.6* 28.5*  PLT 218 161 154  --   --   --   MCV 82.1 82.1 81.5  --   --   --   MCH 26.4 27.3 26.9  --   --   --   MCHC 32.2 33.2 33.0  --   --   --   RDW 15.0 14.8 14.9  --   --   --   LYMPHSABS 2.5  --   --   --   --   --   MONOABS 1.4*  --   --   --   --   --   EOSABS 0.2  --   --   --   --   --   BASOSABS 0.0  --   --   --   --   --     Chemistries   Recent Labs Lab 11/21/16 1617 11/23/16 0606  NA 132* 136  K 3.1* 3.4*  CL 89* 97*  CO2 21* 27  GLUCOSE 344* 224*  BUN 51* 53*  CREATININE 2.82* 2.26*  CALCIUM 8.6* 8.8*  MG  --  2.4  AST 28  --   ALT 17  --   ALKPHOS 70  --   BILITOT 1.2  --     ------------------------------------------------------------------------------------------------------------------ estimated creatinine clearance is 25.3 mL/min (by C-G formula  based on SCr of 2.26 mg/dL (H)). ------------------------------------------------------------------------------------------------------------------ No results for input(s): HGBA1C in the last 72 hours. ------------------------------------------------------------------------------------------------------------------ No results for input(s): CHOL, HDL, LDLCALC, TRIG, CHOLHDL, LDLDIRECT in the last 72 hours. ------------------------------------------------------------------------------------------------------------------ No results for input(s): TSH, T4TOTAL, T3FREE, THYROIDAB in the last 72 hours.  Invalid input(s): FREET3 ------------------------------------------------------------------------------------------------------------------ No results for input(s): VITAMINB12, FOLATE, FERRITIN, TIBC, IRON, RETICCTPCT in the last 72 hours.  Coagulation profile  Recent Labs Lab 11/21/16 1617  INR 1.40    No results for input(s): DDIMER in the last 72 hours.  Cardiac Enzymes No results for input(s): CKMB, TROPONINI, MYOGLOBIN in the last 168 hours.  Invalid input(s): CK ------------------------------------------------------------------------------------------------------------------ Invalid input(s): POCBNP   CBG:  Recent Labs Lab 11/22/16 1551 11/22/16 1922 11/22/16 2257 11/23/16 0308 11/23/16 0802  GLUCAP 293* 425* 482* 280* 224*       Studies: Dg Chest Portable 1 View  Result Date: 11/21/2016 CLINICAL DATA:  Pt with weight gain this AM of 2 lbs, pt has increased SOB. Hx CHF, COPD, CAD, afib EXAM: PORTABLE CHEST 1 VIEW COMPARISON:  10/13/2016 FINDINGS: Status post median sternotomy and CABG. Heart size is normal. Lungs are clear. No pulmonary edema. Degenerative changes are seen in thoracic spine.  IMPRESSION: No evidence for acute  abnormality. Electronically Signed   By: Norva Pavlov M.D.   On: 11/21/2016 17:03      Lab Results  Component Value Date   HGBA1C 10.2 (H) 10/13/2016   Lab Results  Component Value Date   LDLCALC 71 10/14/2016   CREATININE 2.26 (H) 11/23/2016       Scheduled Meds: . insulin aspart  0-20 Units Subcutaneous Q4H  . ipratropium-albuterol  3 mL Nebulization Q6H WA  . methylPREDNISolone (SOLU-MEDROL) injection  20 mg Intravenous Q12H  . mirtazapine  15 mg Oral QHS  . [START ON 11/25/2016] pantoprazole  40 mg Intravenous Q12H   Continuous Infusions: . diltiazem (CARDIZEM) infusion 5 mg/hr (11/23/16 0700)  . pantoprozole (PROTONIX) infusion 8 mg/hr (11/23/16 0700)     LOS: 2 days    Time spent: >30 MINS    [facility]  [facility] Pager [phone]. If 7PM-7AM, please contact night-coverage at [email or web address], password [facility] 11/23/2016, 8:11 AM  LOS: 2 days

## 2016-11-23 NOTE — Progress Notes (Signed)
Patients blood sugar 495 stat glucose ordered, Dr. Susie Cassette updated on patients condition. MD to place orders.  Will continue to monitor.

## 2016-11-23 NOTE — Progress Notes (Signed)
Inpatient Diabetes Program Recommendations  AACE/ADA: New Consensus Statement on Inpatient Glycemic Control (2015)  Target Ranges:  Prepandial:   less than 140 mg/dL      Peak postprandial:   less than 180 mg/dL (1-2 hours)      Critically ill patients:  140 - 180 mg/dL   Lab Results  Component Value Date   GLUCAP 497 (H) 11/23/2016   HGBA1C 10.2 (H) 10/13/2016   Review of Glycemic Control  Diabetes history: DM 2 Outpatient Diabetes medications: Toujeo 40 units, Apidra 14 units TID meal coverage Current orders for Inpatient glycemic control: Lantus 40 units, Novolog Resistant Q4 hours  Inpatient Diabetes Program Recommendations:   Glucose increases significantly after meals. Patient takes meal coverage at home. Please consider starting Novolog 10 units TID with meals only if patient consumes at least 50% of meals.   Note patient is to be NPO after midnight, please makes sure parameters on meal coverage is in place to hold if NPO or consumes <50% of meals or hold for glucose 80 mg/dl and below.  Thanks,  Christena Deem RN, MSN, [facility] Inpatient Diabetes Coordinator Team Pager [phone] (8a-5p)

## 2016-11-24 ENCOUNTER — Encounter (HOSPITAL_COMMUNITY)
Admission: EM | Disposition: A | Discharge status: Skilled Nursing Facility | Payer: Self-pay | Source: Home / Self Care | Attending: Family Medicine

## 2016-11-24 ENCOUNTER — Inpatient Hospital Stay (HOSPITAL_COMMUNITY): Payer: Medicare Other | Admitting: Certified Registered Nurse Anesthetist

## 2016-11-24 ENCOUNTER — Encounter (HOSPITAL_COMMUNITY): Payer: Self-pay | Admitting: Anesthesiology

## 2016-11-24 ENCOUNTER — Inpatient Hospital Stay (HOSPITAL_COMMUNITY): Payer: Medicare Other

## 2016-11-24 DIAGNOSIS — N179 Acute kidney failure, unspecified: Secondary | ICD-10-CM

## 2016-11-24 DIAGNOSIS — N184 Chronic kidney disease, stage 4 (severe): Secondary | ICD-10-CM

## 2016-11-24 DIAGNOSIS — E1165 Type 2 diabetes mellitus with hyperglycemia: Secondary | ICD-10-CM

## 2016-11-24 DIAGNOSIS — E1111 Type 2 diabetes mellitus with ketoacidosis with coma: Secondary | ICD-10-CM

## 2016-11-24 DIAGNOSIS — I5033 Acute on chronic diastolic (congestive) heart failure: Secondary | ICD-10-CM

## 2016-11-24 DIAGNOSIS — E118 Type 2 diabetes mellitus with unspecified complications: Secondary | ICD-10-CM

## 2016-11-24 DIAGNOSIS — I5032 Chronic diastolic (congestive) heart failure: Secondary | ICD-10-CM

## 2016-11-24 DIAGNOSIS — I4891 Unspecified atrial fibrillation: Secondary | ICD-10-CM

## 2016-11-24 DIAGNOSIS — IMO0002 Reserved for concepts with insufficient information to code with codable children: Secondary | ICD-10-CM

## 2016-11-24 HISTORY — PX: ESOPHAGOGASTRODUODENOSCOPY: SHX5428

## 2016-11-24 LAB — TYPE AND SCREEN
Blood Product Expiration Date: 201801072359
Blood Product Expiration Date: 201801092359
Blood Product Expiration Date: 201801092359
ISSUE DATE / TIME: 201712301640
ISSUE DATE / TIME: 201712311455
UNIT TYPE AND RH: 5100
UNIT TYPE AND RH: 5100
Unit Type and Rh: 5100

## 2016-11-24 LAB — BASIC METABOLIC PANEL
Anion gap: 10 (ref 5–15)
Anion gap: 13 (ref 5–15)
BUN: 59 mg/dL — ABNORMAL HIGH (ref 6–20)
BUN: 60 mg/dL — ABNORMAL HIGH (ref 6–20)
CO2: 22 mmol/L (ref 22–32)
CO2: 24 mmol/L (ref 22–32)
Calcium: 8.5 mg/dL — ABNORMAL LOW (ref 8.9–10.3)
Calcium: 8.7 mg/dL — ABNORMAL LOW (ref 8.9–10.3)
Chloride: 93 mmol/L — ABNORMAL LOW (ref 101–111)
Chloride: 99 mmol/L — ABNORMAL LOW (ref 101–111)
Creatinine, Ser: 1.91 mg/dL — ABNORMAL HIGH (ref 0.61–1.24)
Creatinine, Ser: 2.2 mg/dL — ABNORMAL HIGH (ref 0.61–1.24)
GFR calc Af Amer: 30 mL/min — ABNORMAL LOW (ref >60)
GFR calc Af Amer: 35 mL/min — ABNORMAL LOW (ref >60)
GFR calc non Af Amer: 26 mL/min — ABNORMAL LOW (ref >60)
GFR calc non Af Amer: 30 mL/min — ABNORMAL LOW (ref >60)
Glucose, Bld: 160 mg/dL — ABNORMAL HIGH (ref 65–99)
Glucose, Bld: 515 mg/dL (ref 65–99)
Potassium: 4.2 mmol/L (ref 3.5–5.1)
Potassium: 4.5 mmol/L (ref 3.5–5.1)
Sodium: 128 mmol/L — ABNORMAL LOW (ref 135–145)
Sodium: 133 mmol/L — ABNORMAL LOW (ref 135–145)

## 2016-11-24 LAB — GLUCOSE, CAPILLARY
GLUCOSE-CAPILLARY: 397 mg/dL — AB (ref 65–99)
GLUCOSE-CAPILLARY: 186 mg/dL — AB (ref 65–99)
GLUCOSE-CAPILLARY: 115 mg/dL — AB (ref 65–99)
GLUCOSE-CAPILLARY: 141 mg/dL — AB (ref 65–99)
GLUCOSE-CAPILLARY: 136 mg/dL — AB (ref 65–99)
GLUCOSE-CAPILLARY: 147 mg/dL — AB (ref 65–99)
Glucose-Capillary: 135 mg/dL — ABNORMAL HIGH (ref 65–99)
Glucose-Capillary: 142 mg/dL — ABNORMAL HIGH (ref 65–99)
Glucose-Capillary: 144 mg/dL — ABNORMAL HIGH (ref 65–99)
Glucose-Capillary: 149 mg/dL — ABNORMAL HIGH (ref 65–99)
Glucose-Capillary: 150 mg/dL — ABNORMAL HIGH (ref 65–99)
Glucose-Capillary: 160 mg/dL — ABNORMAL HIGH (ref 65–99)
Glucose-Capillary: 224 mg/dL — ABNORMAL HIGH (ref 65–99)
Glucose-Capillary: 248 mg/dL — ABNORMAL HIGH (ref 65–99)
Glucose-Capillary: 290 mg/dL — ABNORMAL HIGH (ref 65–99)
Glucose-Capillary: 294 mg/dL — ABNORMAL HIGH (ref 65–99)
Glucose-Capillary: 340 mg/dL — ABNORMAL HIGH (ref 65–99)
Glucose-Capillary: 415 mg/dL — ABNORMAL HIGH (ref 65–99)
Glucose-Capillary: >600 mg/dL (ref 65–99)

## 2016-11-24 LAB — COMPREHENSIVE METABOLIC PANEL
ALK PHOS: 60 U/L (ref 38–126)
ALT: 14 U/L — AB (ref 17–63)
AST: 17 U/L (ref 15–41)
Albumin: 2.8 g/dL — ABNORMAL LOW (ref 3.5–5.0)
Anion gap: 9 (ref 5–15)
BUN: 57 mg/dL — AB (ref 6–20)
CALCIUM: 8.7 mg/dL — AB (ref 8.9–10.3)
CHLORIDE: 98 mmol/L — AB (ref 101–111)
CO2: 25 mmol/L (ref 22–32)
CREATININE: 2.07 mg/dL — AB (ref 0.61–1.24)
GFR calc non Af Amer: 28 mL/min — ABNORMAL LOW (ref >60)
GFR, EST AFRICAN AMERICAN: 32 mL/min — AB (ref >60)
Glucose, Bld: 305 mg/dL — ABNORMAL HIGH (ref 65–99)
Potassium: 4.1 mmol/L (ref 3.5–5.1)
SODIUM: 132 mmol/L — AB (ref 135–145)
Total Bilirubin: 0.6 mg/dL (ref 0.3–1.2)
Total Protein: 5.8 g/dL — ABNORMAL LOW (ref 6.5–8.1)

## 2016-11-24 LAB — CBC
HCT: 26.2 % — ABNORMAL LOW (ref 39.0–52.0)
HEMOGLOBIN: 8.7 g/dL — AB (ref 13.0–17.0)
MCH: 27.4 pg (ref 26.0–34.0)
MCHC: 33.2 g/dL (ref 30.0–36.0)
MCV: 82.4 fL (ref 78.0–100.0)
PLATELETS: 194 10*3/uL (ref 150–400)
RBC: 3.18 MIL/uL — AB (ref 4.22–5.81)
RDW: 15.2 % (ref 11.5–15.5)
WBC: 19 10*3/uL — AB (ref 4.0–10.5)

## 2016-11-24 LAB — URINALYSIS, ROUTINE W REFLEX MICROSCOPIC
Bilirubin Urine: NEGATIVE
GLUCOSE, UA: 50 mg/dL — AB
HGB URINE DIPSTICK: NEGATIVE
KETONES UR: NEGATIVE mg/dL
LEUKOCYTES UA: NEGATIVE
Nitrite: NEGATIVE
PROTEIN: NEGATIVE mg/dL
Specific Gravity, Urine: 1.013 (ref 1.005–1.030)
pH: 5 (ref 5.0–8.0)

## 2016-11-24 SURGERY — EGD (ESOPHAGOGASTRODUODENOSCOPY)
Anesthesia: Monitor Anesthesia Care

## 2016-11-24 MED ORDER — FUROSEMIDE 10 MG/ML IJ SOLN
40.0000 mg | Freq: Once | INTRAMUSCULAR | Status: AC
  Administered 2016-11-24: 40 mg via INTRAVENOUS
  Filled 2016-11-24: qty 4

## 2016-11-24 MED ORDER — PROPOFOL 10 MG/ML IV BOLUS
INTRAVENOUS | Status: DC | PRN
  Administered 2016-11-24: 10 mg via INTRAVENOUS

## 2016-11-24 MED ORDER — METOCLOPRAMIDE HCL 5 MG/ML IJ SOLN
5.0000 mg | Freq: Once | INTRAMUSCULAR | Status: AC
  Administered 2016-11-24: 5 mg via INTRAVENOUS
  Filled 2016-11-24: qty 2

## 2016-11-24 MED ORDER — PEG-KCL-NACL-NASULF-NA ASC-C 100 G PO SOLR
0.5000 | Freq: Once | ORAL | Status: AC
  Administered 2016-11-24: 100 g via ORAL
  Filled 2016-11-24: qty 1

## 2016-11-24 MED ORDER — INSULIN GLARGINE 100 UNIT/ML ~~LOC~~ SOLN
10.0000 [IU] | Freq: Every day | SUBCUTANEOUS | Status: DC
Start: 2016-11-24 — End: 2016-11-25
  Administered 2016-11-24: 10 [IU] via SUBCUTANEOUS
  Filled 2016-11-24 (×2): qty 0.1

## 2016-11-24 MED ORDER — INSULIN ASPART 100 UNIT/ML ~~LOC~~ SOLN
0.0000 [IU] | SUBCUTANEOUS | Status: DC
  Administered 2016-11-24: 5 [IU] via SUBCUTANEOUS
  Administered 2016-11-24: 8 [IU] via SUBCUTANEOUS
  Administered 2016-11-25 (×2): 5 [IU] via SUBCUTANEOUS
  Administered 2016-11-25: 11 [IU] via SUBCUTANEOUS
  Administered 2016-11-25 – 2016-11-26 (×2): 8 [IU] via SUBCUTANEOUS
  Administered 2016-11-26: 5 [IU] via SUBCUTANEOUS
  Administered 2016-11-26: 3 [IU] via SUBCUTANEOUS
  Administered 2016-11-26: 11 [IU] via SUBCUTANEOUS

## 2016-11-24 MED ORDER — LACTATED RINGERS IV SOLN
INTRAVENOUS | Status: DC | PRN
  Administered 2016-11-24: 10:00:00 via INTRAVENOUS

## 2016-11-24 MED ORDER — METOCLOPRAMIDE HCL 5 MG/ML IJ SOLN
5.0000 mg | Freq: Once | INTRAMUSCULAR | Status: AC
Start: 2016-11-24 — End: 2016-11-24
  Administered 2016-11-24: 5 mg via INTRAVENOUS
  Filled 2016-11-24: qty 2

## 2016-11-24 MED ORDER — DILTIAZEM HCL 60 MG PO TABS
60.0000 mg | ORAL_TABLET | Freq: Four times a day (QID) | ORAL | Status: DC
Start: 2016-11-24 — End: 2016-11-25
  Administered 2016-11-24 – 2016-11-25 (×3): 60 mg via ORAL
  Filled 2016-11-24 (×4): qty 1

## 2016-11-24 MED ORDER — SODIUM CHLORIDE 0.9 % IV SOLN
INTRAVENOUS | Status: DC
Start: 2016-11-24 — End: 2016-11-25
  Administered 2016-11-24: 18:00:00 via INTRAVENOUS

## 2016-11-24 MED ORDER — SODIUM CHLORIDE 0.9 % IV SOLN: INTRAVENOUS | Status: DC

## 2016-11-24 MED ORDER — PROPOFOL 500 MG/50ML IV EMUL
INTRAVENOUS | Status: DC | PRN
  Administered 2016-11-24: 75 ug/kg/min via INTRAVENOUS

## 2016-11-24 NOTE — Op Note (Signed)
[facility] Patient Name: Devin Heath Procedure Date : 11/24/2016 MRN: [id number] Attending MD: Iva Boop , MD Date of Birth: 1929/08/30 CSN: [id number] Age: 83 Admit Type: Inpatient Procedure:                Upper GI endoscopy Indications:              Melena Providers:                Iva Boop, MD, Dwain Sarna, RN, Arlee Muslim Tech., Technician Referring MD:              Medicines:                Monitored Anesthesia Care Complications:            No immediate complications. Estimated Blood Loss:     Estimated blood loss: none. Procedure:                Pre-Anesthesia Assessment:                           - Prior to the procedure, a History and Physical                            was performed, and patient medications and                            allergies were reviewed. The patient's tolerance of                            previous anesthesia was also reviewed. The risks                            and benefits of the procedure and the sedation                            options and risks were discussed with the patient.                            All questions were answered, and informed consent                            was obtained. Prior Anticoagulants: The patient                            last took Eliquis (apixaban) 2 days prior to the                            procedure. ASA Grade Assessment: III - A patient                            with severe systemic disease. After reviewing the  risks and benefits, the patient was deemed in                            satisfactory condition to undergo the procedure.                           After obtaining informed consent, the endoscope was                            passed under direct vision. Throughout the                            procedure, the patient's blood pressure, pulse, and                            oxygen saturations were  monitored continuously. The                            EG-[id number] ([id number]) scope was introduced through the                            mouth, and advanced to the second part of duodenum.                            The upper GI endoscopy was accomplished without                            difficulty. The patient tolerated the procedure                            well. The upper GI endoscopy was accomplished                            without difficulty. The patient tolerated the                            procedure well. Scope In: Scope Out: Findings:      The esophagus was normal.      The stomach was normal.      The examined duodenum was normal.      The cardia and gastric fundus were normal on retroflexion. Impression:               - Normal esophagus.                           - Normal stomach.                           - Normal examined duodenum.                           - No specimens collected. Moderate Sedation:      Please see anesthesia notes, moderate sedation not given Recommendation:           - Clear liquid diet.                           -  Continue present medications.                           - Perform a colonoscopy tomorrow. Procedure Code(s):        --- Professional ---                           [phone], Esophagogastroduodenoscopy, flexible,                            transoral; diagnostic, including collection of                            specimen(s) by brushing or washing, when performed                            (separate procedure) Diagnosis Code(s):        --- Professional ---                           K92.1, Melena (includes Hematochezia) CPT copyright 2016 American Medical Association. All rights reserved. The codes documented in this report are preliminary and upon coder review may  be revised to meet current compliance requirements. Iva Boop, MD 11/24/2016 10:39:58 AM This report has been signed electronically. Number of Addenda: 0

## 2016-11-24 NOTE — Anesthesia Procedure Notes (Signed)
Procedure Name: MAC Date/Time: 11/24/2016 10:12 AM Performed by: Oletta Lamas Pre-anesthesia Checklist: Patient identified, Emergency Drugs available, Suction available and Patient being monitored Patient Re-evaluated:Patient Re-evaluated prior to inductionOxygen Delivery Method: Nasal cannula

## 2016-11-24 NOTE — Anesthesia Preprocedure Evaluation (Addendum)
Anesthesia Evaluation  Patient identified by MRN, date of birth, ID band Patient awake    Reviewed: Allergy & Precautions, NPO status , Patient's Chart, lab work & pertinent test results, reviewed documented beta blocker date and time   Airway Mallampati: I  TM Distance: >3 FB Neck ROM: Full    Dental  (+) Edentulous Upper, Edentulous Lower   Pulmonary sleep apnea and Continuous Positive Airway Pressure Ventilation , COPD,  COPD inhaler and oxygen dependent, former smoker,    breath sounds clear to auscultation       Cardiovascular hypertension, Pt. on home beta blockers + CAD and +CHF  + dysrhythmias Atrial Fibrillation  Rhythm:Irregular Rate:Normal     Neuro/Psych negative neurological ROS  negative psych ROS   GI/Hepatic Neg liver ROS, GERD  Medicated,  Endo/Other  diabetes, Type 2, Insulin Dependent  Renal/GU Renal InsufficiencyRenal disease  negative genitourinary   Musculoskeletal  (+) Arthritis , Rheumatoid disorders,    Abdominal   Peds negative pediatric ROS (+)  Hematology negative hematology ROS (+)   Anesthesia Other Findings   Reproductive/Obstetrics negative OB ROS                            Lab Results  Component Value Date   WBC 19.0 (H) 11/24/2016   HGB 8.7 (L) 11/24/2016   HCT 26.2 (L) 11/24/2016   MCV 82.4 11/24/2016   PLT 194 11/24/2016   Lab Results  Component Value Date   CREATININE 1.91 (H) 11/24/2016   BUN 59 (H) 11/24/2016   NA 133 (L) 11/24/2016   K 4.2 11/24/2016   CL 99 (L) 11/24/2016   CO2 24 11/24/2016   Lab Results  Component Value Date   INR 1.40 11/21/2016   10/2016 EKG: atrial fibrillation.  09/2016 Echo - Left ventricle: The cavity size was normal. Wall thickness was   normal. Systolic function was normal. The estimated ejection   fraction was in the range of 60% to 65%. Wall motion was normal;   there were no regional wall motion  abnormalities. - Aortic valve: Mildly calcified annulus. Mildly thickened   leaflets. There was trivial regurgitation. - Mitral valve: There was mild regurgitation. - Pulmonary arteries: Systolic pressure was mildly increased. PA   peak pressure: 31 mm Hg (S).   Anesthesia Physical Anesthesia Plan  ASA: III  Anesthesia Plan: MAC   Post-op Pain Management:    Induction: Intravenous  Airway Management Planned: Natural Airway  Additional Equipment:   Intra-op Plan:   Post-operative Plan:   Informed Consent: I have reviewed the patients History and Physical, chart, labs and discussed the procedure including the risks, benefits and alternatives for the proposed anesthesia with the patient or authorized representative who has indicated his/her understanding and acceptance.     Plan Discussed with: CRNA  Anesthesia Plan Comments:         Anesthesia Quick Evaluation

## 2016-11-24 NOTE — Progress Notes (Signed)
Pt having increased RR, HR, crackles and increased shortness or breath with exertion. MD paged. MD ordered lasix, and CXR. Will continue to monitor.

## 2016-11-24 NOTE — Progress Notes (Signed)
PROGRESS NOTE    Devin Heath  WUJ:[id number] DOB: 1929/01/21 DOA: 11/21/2016 PCP: [name], MD   Brief Narrative:  83 year old WM PMHx A.-Fib just started on Eliquis (w/ CHADVASC 5), Ischemic Cardiomyopathy w/ EF 20%, CAD native artery, DM type II uncontrolled with consultation CKD stage IV , COPD on home O2 and OSA on CPAP. F/b lebaeur Cards.   Recently re-located from [place] to be closer to family due to worsening physical decline over the last year. Was. Per pt and family pt had been feeling weaker for the last 2 d PTA. On day of admit pt could not get OOB. Was incoherent. On arrival to ER had large melanic stool. HR 180s AF. Lactic acid was 8.6, initial Hgb 10.3 (baseline ~11).  He was treated w/ IV Diltiazem, Typed,screened and transfused 1 u PRBC. PCCM asked to admit.    Subjective: A/O 4, NAD, patient joking with staff. Understands why his discharge is delayed.    Assessment & Plan:   Active Problems:   Lactic acidosis   GIB (gastrointestinal bleeding)   Melena   Acute blood loss anemia   Coronary artery disease involving coronary bypass graft of native heart without angina pectoris   Chronic anticoagulation    COPD -Albuterol PRN -DuoNeb BID -Solu-Medrol 20 mg BID  OSA -CPAP per respiratory    Ischemic cardiomyopathyw/ EF 20% -Strict in and out since admission + 2.1 L -Daily weight Filed Weights   11/24/16 0407 11/24/16 0954 11/24/16 1400  Weight: 89.1 kg (196 lb 6.9 oz) 88.9 kg (196 lb) 89.5 kg (197 lb 5 oz)    A-Fib  w/ RVR -Rate controlled on diltiazem drip. DC -Start Cardizem 60 mg PO QID  CAD in native artery -See A. fib with RVR  Chronic Diastolic CHF -See A. fib with RVR  Acute on CKD stage IV(baseline 1.76 -2.0)   Lab Results  Component Value Date   CREATININE 1.91 (H) 11/24/2016   CREATININE 2.07 (H) 11/24/2016   CREATININE 2.20 (H) 11/23/2016    Melena/UGIB? -1/2 S/P EGD results pending -1/3 colonoscopy  planned  Elevated Ammonia  -Trend  Acute Blood Loss Anemia  -Hold NOAC -Trend CBC Recent Labs Lab 11/22/16 1212 11/22/16 1651 11/23/16 0606 11/23/16 2124 11/24/16 0350  HGB 10.2* 10.1* 9.4* 9.0* 8.7*  -Transfuse for hemoglobin<8   Probable relative adrenal dysfxn (steroids for gout)   DM Type 2 uncontrolled with complication/DKA -DKA resolved -Lantus 10 units daily -Moderate SSI    Generalized weakness -Hold sedating meds     DVT prophylaxis:  SCD Code Status: Full Family Communication: Wife present at bedside Disposition Plan: Per GI   Consultants:   GI consulted    Procedures/Significant Events:  12/30 transfuse one unit PRBC     VENTILATOR SETTINGS: NA   Cultures 1/2 Blood culture  1/2 urine pending    Antimicrobials: Anti-infectives    Start     Stop   11/21/16 1800  levofloxacin (LEVAQUIN) IVPB 500 mg  Status:  Discontinued     11/21/16 2033   11/21/16 1730  vancomycin (VANCOCIN) 1,250 mg in sodium chloride 0.9 % 250 mL IVPB  Status:  Discontinued     11/21/16 2033       Devices NA  LINES / TUBES:      Continuous Infusions: . sodium chloride 50 mL/hr at 11/23/16 2257  . dextrose 5 % and 0.45% NaCl 50 mL/hr at 11/24/16 0515  . diltiazem (CARDIZEM) infusion 5 mg/hr (11/23/16 1710)  .  insulin (NOVOLIN-R) infusion 7 Units/hr (11/24/16 0715)     Objective: Vitals:   11/23/16 1919 11/23/16 2316 11/24/16 0407 11/24/16 0700  BP: (!) 146/69 130/80 129/79 120/63  Pulse: 79 88 86 94  Resp: 20 19 20 18   Temp: 98.3 F (36.8 C) 98.3 F (36.8 C) 98.4 F (36.9 C) 98.2 F (36.8 C)  TempSrc: Oral Oral Oral Oral  SpO2: 100% 99% 97% 97%  Weight:   89.1 kg (196 lb 6.9 oz)   Height:        Intake/Output Summary (Last 24 hours) at 11/24/16 0800 Last data filed at 11/24/16 0408  Gross per 24 hour  Intake           1726.5 ml  Output             1500 ml  Net            226.5 ml   Filed Weights   11/22/16 0228 11/23/16 0306  11/24/16 0407  Weight: 85 kg (187 lb 6.3 oz) 87.9 kg (193 lb 12.6 oz) 89.1 kg (196 lb 6.9 oz)    Examination:  General: A/O 4, NAD, No acute respiratory distress Eyes: negative scleral hemorrhage, negative anisocoria, negative icterus ENT: Negative Runny nose, negative gingival bleeding, Neck:  Negative scars, masses, torticollis, lymphadenopathy, JVD Lungs: Clear to auscultation bilaterally without wheezes or crackles Cardiovascular: Regular rate and rhythm without murmur gallop or rub normal S1 and S2 Abdomen: negative abdominal pain, nondistended, positive soft, bowel sounds, no rebound, no ascites, no appreciable mass Extremities: No significant cyanosis, clubbing, or edema bilateral lower extremities Skin: Negative rashes, lesions, ulcers Psychiatric:  Negative depression, negative anxiety, negative fatigue, negative mania  Central nervous system:  Cranial nerves II through XII intact, tongue/uvula midline, all extremities muscle strength 5/5, sensation intact throughout, negative dysarthria, negative expressive aphasia, negative receptive aphasia.  .     Data Reviewed: Care during the described time interval was provided by me .  I have reviewed this patient's available data, including medical history, events of note, physical examination, and all test results as part of my evaluation. I have personally reviewed and interpreted all radiology studies.  CBC:  Recent Labs Lab 11/21/16 1617 11/21/16 2154 11/22/16 0432 11/22/16 1212 11/22/16 1651 11/23/16 0606 11/23/16 2124 11/24/16 0350  WBC 22.8* 17.1* 16.2*  --   --   --   --  19.0*  NEUTROABS 18.7*  --   --   --   --   --   --   --   HGB 10.3* 10.5* 9.9* 10.2* 10.1* 9.4* 9.0* 8.7*  HCT 32.0* 31.6* 30.0* 31.6* 30.6* 28.5* 27.3* 26.2*  MCV 82.1 82.1 81.5  --   --   --   --  82.4  PLT 218 161 154  --   --   --   --  194   Basic Metabolic Panel:  Recent Labs Lab 11/21/16 1617 11/23/16 0606 11/23/16 1259  11/23/16 1552 11/23/16 1951 11/23/16 2337 11/24/16 0350  NA 132* 136  --   --  127* 128* 132*  K 3.1* 3.4*  --   --  4.7 4.5 4.1  CL 89* 97*  --   --  89* 93* 98*  CO2 21* 27  --   --  22 22 25   GLUCOSE 344* 224* 494* 578* 616* 515* 305*  BUN 51* 53*  --   --  60* 60* 57*  CREATININE 2.82* 2.26*  --   --  2.45*  2.20* 2.07*  CALCIUM 8.6* 8.8*  --   --  8.7* 8.5* 8.7*  MG  --  2.4  --   --   --   --   --    GFR: Estimated Creatinine Clearance: 27.8 mL/min (by C-G formula based on SCr of 2.07 mg/dL (H)). Liver Function Tests:  Recent Labs Lab 11/21/16 1617 11/24/16 0350  AST 28 17  ALT 17 14*  ALKPHOS 70 60  BILITOT 1.2 0.6  PROT 6.3* 5.8*  ALBUMIN 3.3* 2.8*   No results for input(s): LIPASE, AMYLASE in the last 168 hours.  Recent Labs Lab 11/21/16 2153  AMMONIA 37*   Coagulation Profile:  Recent Labs Lab 11/21/16 1617  INR 1.40   Cardiac Enzymes: No results for input(s): CKTOTAL, CKMB, CKMBINDEX, TROPONINI in the last 168 hours. BNP (last 3 results) No results for input(s): PROBNP in the last 8760 hours. HbA1C: No results for input(s): HGBA1C in the last 72 hours. CBG:  Recent Labs Lab 11/24/16 0303 11/24/16 0406 11/24/16 0511 11/24/16 0609 11/24/16 0712  GLUCAP 340* 294* 248* 186* 160*   Lipid Profile: No results for input(s): CHOL, HDL, LDLCALC, TRIG, CHOLHDL, LDLDIRECT in the last 72 hours. Thyroid Function Tests: No results for input(s): TSH, T4TOTAL, FREET4, T3FREE, THYROIDAB in the last 72 hours. Anemia Panel: No results for input(s): VITAMINB12, FOLATE, FERRITIN, TIBC, IRON, RETICCTPCT in the last 72 hours. Urine analysis:    Component Value Date/Time   COLORURINE YELLOW 10/13/2016 1614   APPEARANCEUR CLEAR 10/13/2016 1614   LABSPEC 1.010 10/13/2016 1614   PHURINE 6.5 10/13/2016 1614   GLUCOSEU NEGATIVE 10/13/2016 1614   HGBUR NEGATIVE 10/13/2016 1614   BILIRUBINUR NEGATIVE 10/13/2016 1614   KETONESUR NEGATIVE 10/13/2016 1614    PROTEINUR NEGATIVE 10/13/2016 1614   NITRITE NEGATIVE 10/13/2016 1614   LEUKOCYTESUR NEGATIVE 10/13/2016 1614   Sepsis Labs: @LABRCNTIP (procalcitonin:4,lacticidven:4)  ) Recent Results (from the past 240 hour(s))  MRSA PCR Screening     Status: None   Collection Time: 11/21/16  8:32 PM  Result Value Ref Range Status   MRSA by PCR NEGATIVE NEGATIVE Final    Comment:        The GeneXpert MRSA Assay (FDA approved for NASAL specimens only), is one component of a comprehensive MRSA colonization surveillance program. It is not intended to diagnose MRSA infection nor to guide or monitor treatment for MRSA infections.          Radiology Studies: No results found.      Scheduled Meds: . ipratropium-albuterol  3 mL Nebulization BID  . methylPREDNISolone (SOLU-MEDROL) injection  20 mg Intravenous Q12H  . mirtazapine  15 mg Oral QHS  . pantoprazole  40 mg Oral BID AC   Continuous Infusions: . sodium chloride 50 mL/hr at 11/23/16 2257  . dextrose 5 % and 0.45% NaCl 50 mL/hr at 11/24/16 0515  . diltiazem (CARDIZEM) infusion 5 mg/hr (11/23/16 1710)  . insulin (NOVOLIN-R) infusion 7 Units/hr (11/24/16 0715)     LOS: 3 days    Time spent: 40 minutes    Anaija Wissink, 01/22/17, MD [facility] Pager [phone]   If 7PM-7AM, please contact night-coverage [email or web address] Password [email or web address] 11/24/2016, 8:00 AM

## 2016-11-24 NOTE — Progress Notes (Signed)
Pt blood sugar 553. MD paged. MD ordered BMET . Glucose on BMET 616. Na 127. Anion gap 16. Md paged. MD ordered bolus, 30 mEq of potassium IVPB, and a insulin drip. Will continue to monitor.

## 2016-11-24 NOTE — Anesthesia Postprocedure Evaluation (Signed)
Anesthesia Post Note  Patient: Devin Heath  Procedure(s) Performed: Procedure(s) (LRB): ESOPHAGOGASTRODUODENOSCOPY (EGD) (N/A)  Patient location during evaluation: PACU Anesthesia Type: MAC Level of consciousness: awake and alert Pain management: pain level controlled Vital Signs Assessment: post-procedure vital signs reviewed and stable Respiratory status: spontaneous breathing, nonlabored ventilation, respiratory function stable and patient connected to nasal cannula oxygen Cardiovascular status: stable and blood pressure returned to baseline Anesthetic complications: no       Last Vitals:  Vitals:   11/24/16 1130 11/24/16 1135  BP: (!) 151/75   Pulse: 89 94  Resp: (!) 21 (!) 27  Temp:      Last Pain:  Vitals:   11/24/16 1030  TempSrc: Oral  PainSc:                  Effie Berkshire

## 2016-11-24 NOTE — Transfer of Care (Signed)
Immediate Anesthesia Transfer of Care Note  Patient: Devin Heath  Procedure(s) Performed: Procedure(s): ESOPHAGOGASTRODUODENOSCOPY (EGD) (N/A)  Patient Location: PACU  Anesthesia Type:MAC  Level of Consciousness: awake, alert  and patient cooperative  Airway & Oxygen Therapy: Patient Spontanous Breathing and Patient connected to nasal cannula oxygen  Post-op Assessment: Report given to RN and Post -op Vital signs reviewed and stable  Post vital signs: Reviewed and stable  Last Vitals:  Vitals:   11/24/16 0954 11/24/16 1030  BP: (!) 151/71 (!) 110/40  Pulse: 93 86  Resp: 20 18  Temp: 36.5 C     Last Pain:  Vitals:   11/24/16 0954  TempSrc: Oral  PainSc:          Complications: No apparent anesthesia complications

## 2016-11-25 ENCOUNTER — Encounter (HOSPITAL_COMMUNITY)
Admission: EM | Disposition: A | Discharge status: Skilled Nursing Facility | Payer: Self-pay | Source: Home / Self Care | Attending: Family Medicine

## 2016-11-25 ENCOUNTER — Encounter (HOSPITAL_COMMUNITY): Payer: Self-pay

## 2016-11-25 ENCOUNTER — Inpatient Hospital Stay (HOSPITAL_COMMUNITY): Payer: Medicare Other | Admitting: Anesthesiology

## 2016-11-25 ENCOUNTER — Inpatient Hospital Stay (HOSPITAL_COMMUNITY): Payer: Medicare Other

## 2016-11-25 HISTORY — PX: GIVENS CAPSULE STUDY: SHX5432

## 2016-11-25 HISTORY — PX: COLONOSCOPY WITH PROPOFOL: SHX5780

## 2016-11-25 LAB — BASIC METABOLIC PANEL
ANION GAP: 12 (ref 5–15)
BUN: 54 mg/dL — ABNORMAL HIGH (ref 6–20)
CALCIUM: 8.7 mg/dL — AB (ref 8.9–10.3)
CO2: 24 mmol/L (ref 22–32)
Chloride: 103 mmol/L (ref 101–111)
Creatinine, Ser: 2.05 mg/dL — ABNORMAL HIGH (ref 0.61–1.24)
GFR, EST AFRICAN AMERICAN: 32 mL/min — AB (ref >60)
GFR, EST NON AFRICAN AMERICAN: 28 mL/min — AB (ref >60)
Glucose, Bld: 279 mg/dL — ABNORMAL HIGH (ref 65–99)
Potassium: 5.1 mmol/L (ref 3.5–5.1)
SODIUM: 139 mmol/L (ref 135–145)

## 2016-11-25 LAB — CBC WITH DIFFERENTIAL/PLATELET
Basophils Relative: 0 %
EOS PCT: 0 %
HCT: 29.3 % — ABNORMAL LOW (ref 39.0–52.0)
Hemoglobin: 9.7 g/dL — ABNORMAL LOW (ref 13.0–17.0)
Lymphocytes Relative: 3 %
MCH: 27.5 pg (ref 26.0–34.0)
MCHC: 33.1 g/dL (ref 30.0–36.0)
MCV: 83 fL (ref 78.0–100.0)
Monocytes Relative: 3 %
Neutrophils Relative %: 94 %
PLATELETS: 225 10*3/uL (ref 150–400)
RBC: 3.53 MIL/uL — AB (ref 4.22–5.81)
RDW: 15.8 % — ABNORMAL HIGH (ref 11.5–15.5)
WBC: 21.8 10*3/uL — AB (ref 4.0–10.5)

## 2016-11-25 LAB — GLUCOSE, CAPILLARY
GLUCOSE-CAPILLARY: 249 mg/dL — AB (ref 65–99)
GLUCOSE-CAPILLARY: 222 mg/dL — AB (ref 65–99)
GLUCOSE-CAPILLARY: 337 mg/dL — AB (ref 65–99)
Glucose-Capillary: 263 mg/dL — ABNORMAL HIGH (ref 65–99)
Glucose-Capillary: 206 mg/dL — ABNORMAL HIGH (ref 65–99)
Glucose-Capillary: 265 mg/dL — ABNORMAL HIGH (ref 65–99)

## 2016-11-25 LAB — MAGNESIUM: Magnesium: 2.4 mg/dL (ref 1.7–2.4)

## 2016-11-25 SURGERY — COLONOSCOPY WITH PROPOFOL
Anesthesia: Monitor Anesthesia Care

## 2016-11-25 SURGERY — IMAGING PROCEDURE, GI TRACT, INTRALUMINAL, VIA CAPSULE
Anesthesia: LOCAL

## 2016-11-25 MED ORDER — SODIUM CHLORIDE 0.9 % IV SOLN
INTRAVENOUS | Status: DC | PRN
  Administered 2016-11-25: 10:00:00 via INTRAVENOUS

## 2016-11-25 MED ORDER — SODIUM CHLORIDE 0.9 % IV SOLN
INTRAVENOUS | Status: DC
  Administered 2016-11-25: 500 mL via INTRAVENOUS

## 2016-11-25 MED ORDER — PROMETHAZINE HCL 25 MG/ML IJ SOLN: 6.2500 mg | INTRAMUSCULAR | Status: DC | PRN

## 2016-11-25 MED ORDER — INSULIN GLARGINE 100 UNIT/ML ~~LOC~~ SOLN
10.0000 [IU] | Freq: Two times a day (BID) | SUBCUTANEOUS | Status: DC
  Administered 2016-11-25 – 2016-12-01 (×13): 10 [IU] via SUBCUTANEOUS
  Filled 2016-11-25 (×14): qty 0.1

## 2016-11-25 MED ORDER — CARVEDILOL 25 MG PO TABS
25.0000 mg | ORAL_TABLET | Freq: Two times a day (BID) | ORAL | Status: DC
  Administered 2016-11-25 – 2016-12-01 (×12): 25 mg via ORAL
  Filled 2016-11-25 (×12): qty 1

## 2016-11-25 MED ORDER — ONDANSETRON HCL 4 MG/2ML IJ SOLN
INTRAMUSCULAR | Status: DC | PRN
  Administered 2016-11-25: 4 mg via INTRAVENOUS

## 2016-11-25 MED ORDER — TERAZOSIN HCL 2 MG PO CAPS
2.0000 mg | ORAL_CAPSULE | Freq: Every day | ORAL | Status: DC
  Administered 2016-11-25 – 2016-11-30 (×6): 2 mg via ORAL
  Filled 2016-11-25 (×7): qty 1

## 2016-11-25 MED ORDER — HYDROMORPHONE HCL 1 MG/ML IJ SOLN: 0.2500 mg | INTRAMUSCULAR | Status: DC | PRN

## 2016-11-25 MED ORDER — TORSEMIDE 20 MG PO TABS
20.0000 mg | ORAL_TABLET | Freq: Two times a day (BID) | ORAL | Status: DC
  Administered 2016-11-25 – 2016-11-29 (×8): 20 mg via ORAL
  Filled 2016-11-25 (×8): qty 1

## 2016-11-25 MED ORDER — LIDOCAINE HCL (CARDIAC) 20 MG/ML IV SOLN
INTRAVENOUS | Status: DC | PRN
  Administered 2016-11-25: 50 mg via INTRATRACHEAL

## 2016-11-25 MED ORDER — ALLOPURINOL 100 MG PO TABS
100.0000 mg | ORAL_TABLET | Freq: Every day | ORAL | Status: DC
  Administered 2016-11-26 – 2016-12-01 (×6): 100 mg via ORAL
  Filled 2016-11-25 (×6): qty 1

## 2016-11-25 MED ORDER — TIOTROPIUM BROMIDE MONOHYDRATE 18 MCG IN CAPS
18.0000 ug | ORAL_CAPSULE | Freq: Every day | RESPIRATORY_TRACT | Status: DC
  Administered 2016-11-27 – 2016-11-30 (×3): 18 ug via RESPIRATORY_TRACT
  Filled 2016-11-25 (×2): qty 5

## 2016-11-25 MED ORDER — METOCLOPRAMIDE HCL 5 MG/ML IJ SOLN
5.0000 mg | Freq: Once | INTRAMUSCULAR | Status: AC
Start: 2016-11-25 — End: 2016-11-25
  Administered 2016-11-25: 5 mg via INTRAVENOUS
  Filled 2016-11-25: qty 2

## 2016-11-25 MED ORDER — PROPOFOL 500 MG/50ML IV EMUL
INTRAVENOUS | Status: DC | PRN
  Administered 2016-11-25: 50 ug/kg/min via INTRAVENOUS

## 2016-11-25 MED ORDER — HYDRALAZINE HCL 20 MG/ML IJ SOLN: 10.0000 mg | Freq: Four times a day (QID) | INTRAMUSCULAR | Status: DC | PRN

## 2016-11-25 MED ORDER — SODIUM CHLORIDE 0.9 % IV SOLN
INTRAVENOUS | Status: DC
  Administered 2016-11-25: 12:00:00 via INTRAVENOUS

## 2016-11-25 MED ORDER — METHYLPREDNISOLONE SODIUM SUCC 40 MG IJ SOLR
20.0000 mg | Freq: Every day | INTRAMUSCULAR | Status: AC
  Administered 2016-11-26: 20 mg via INTRAVENOUS
  Filled 2016-11-25: qty 1

## 2016-11-25 MED ORDER — SODIUM CHLORIDE 0.9 % IV SOLN: INTRAVENOUS | Status: AC

## 2016-11-25 NOTE — Transfer of Care (Signed)
Immediate Anesthesia Transfer of Care Note  Patient: Devin Heath  Procedure(s) Performed: Procedure(s): COLONOSCOPY WITH PROPOFOL (N/A)  Patient Location: Endoscopy Unit  Anesthesia Type:MAC  Level of Consciousness: awake, oriented, sedated, patient cooperative and responds to stimulation  Airway & Oxygen Therapy: Patient Spontanous Breathing and Patient connected to face mask oxygen  Post-op Assessment: Report given to RN, Post -op Vital signs reviewed and stable, Patient moving all extremities and Patient moving all extremities X 4  Post vital signs: Reviewed and stable  Last Vitals:  Vitals:   11/25/16 0700 11/25/16 0842  BP: 138/66 (!) 165/82  Pulse: 93 100  Resp: (!) 24 20  Temp: 36.6 C 36.5 C    Last Pain:  Vitals:   11/25/16 0842  TempSrc: Oral  PainSc:          Complications: No apparent anesthesia complications

## 2016-11-25 NOTE — Anesthesia Postprocedure Evaluation (Addendum)
Anesthesia Post Note  Patient: Devin Heath  Procedure(s) Performed: Procedure(s) (LRB): COLONOSCOPY WITH PROPOFOL (N/A) GIVENS CAPSULE STUDY (N/A)  Patient location during evaluation: PACU Anesthesia Type: MAC Level of consciousness: awake and alert Pain management: pain level controlled Vital Signs Assessment: post-procedure vital signs reviewed and stable Respiratory status: spontaneous breathing and respiratory function stable Cardiovascular status: stable Anesthetic complications: no       Last Vitals:  Vitals:   11/25/16 1123 11/25/16 1133  BP: (!) 154/67 (!) 108/54  Pulse: 95 93  Resp: 19 20  Temp:      Last Pain:  Vitals:   11/25/16 1053  TempSrc: Oral  PainSc:                  Lenola Lockner DANIEL

## 2016-11-25 NOTE — Op Note (Signed)
[facility] Patient Name: Devin Heath Procedure Date : 11/25/2016 MRN: [id number] Attending MD: Iva Boop , MD Date of Birth: 1929/02/08 CSN: [id number] Age: 83 Admit Type: Inpatient Procedure:                Colonoscopy Indications:              Melena Providers:                Iva Boop, MD, Norman Clay, RN, Lorenda Ishihara,                            Technician, Wray Kearns, CRNA Referring MD:              Medicines:                Propofol per Anesthesia, Monitored Anesthesia Care Complications:            No immediate complications. Estimated blood loss:                            None. Estimated Blood Loss:     Estimated blood loss: none. Procedure:                Pre-Anesthesia Assessment:                           - Prior to the procedure, a History and Physical                            was performed, and patient medications and                            allergies were reviewed. The patient's tolerance of                            previous anesthesia was also reviewed. The risks                            and benefits of the procedure and the sedation                            options and risks were discussed with the patient.                            All questions were answered, and informed consent                            was obtained. Prior Anticoagulants: The patient                            last took Eliquis (apixaban) 4 days prior to the                            procedure. ASA Grade Assessment: III - A patient  with severe systemic disease. After reviewing the                            risks and benefits, the patient was deemed in                            satisfactory condition to undergo the procedure.                           After obtaining informed consent, the colonoscope                            was passed under direct vision. Throughout the                            procedure, the patient's  blood pressure, pulse, and                            oxygen saturations were monitored continuously. The                            EC-[id number] ([id number]) scope was introduced through                            the anus and advanced to the the cecum, identified                            by appendiceal orifice and ileocecal valve. The                            ileocecal valve, appendiceal orifice, and rectum                            were photographed. The quality of the bowel                            preparation was good. The colonoscopy was performed                            without difficulty. The patient tolerated the                            procedure well. The bowel preparation used was                            MoviPrep. Scope In: 10:30:27 AM Scope Out: 10:45:33 AM Scope Withdrawal Time: 0 hours 11 minutes 28 seconds  Total Procedure Duration: 0 hours 15 minutes 6 seconds  Findings:      The perianal and digital rectal examinations were normal. Pertinent       negatives include normal prostate (size, shape, and consistency).      The colon (entire examined portion) appeared normal.      No additional abnormalities were found on retroflexion. Impression:               -  The entire examined colon is normal.                           - No specimens collected. Moderate Sedation:      Please see anesthesia notes, moderate sedation not given Recommendation:           - Continue present medications.                           - No repeat colonoscopy due to age.                           - Will do capsule endoscopy as EGD and colonoscopy                            do not reveal bleeding source                           - Make the patient NPO starting today. Procedure Code(s):        --- Professional ---                           [phone], Colonoscopy, flexible; diagnostic, including                            collection of specimen(s) by brushing or washing,                             when performed (separate procedure) Diagnosis Code(s):        --- Professional ---                           K92.1, Melena (includes Hematochezia) CPT copyright 2016 [employer]. All rights reserved. The codes documented in this report are preliminary and upon coder review may  be revised to meet current compliance requirements. Iva Boop, MD 11/25/2016 11:08:46 AM This report has been signed electronically. Number of Addenda: 0

## 2016-11-25 NOTE — Care Management Important Message (Signed)
Important Message  Patient Details  Name: Devin Heath MRN: [id number] Date of Birth: [birth year]   Medicare Important Message Given:  Yes    Ed Mandich [name] 11/25/2016, 11:47 AM

## 2016-11-25 NOTE — Progress Notes (Signed)
PROGRESS NOTE    Christoph Copelan  YKD:[id number] DOB: [birth year] DOA: 11/21/2016 PCP: [name], MD   Brief Narrative:  83 year old WM PMHx A.-Fib just started on Eliquis (w/ CHADVASC 5), Ischemic Cardiomyopathy w/ EF 20%, CAD native artery, DM type II uncontrolled with consultation CKD stage IV , COPD on home O2 and OSA on CPAP. F/b lebaeur Cards.   Recently re-located from [place] to be closer to family due to worsening physical decline over the last year. Was. Per pt and family pt had been feeling weaker for the last 2 d PTA. On day of admit pt could not get OOB. Was incoherent. On arrival to ER had large melanic stool. HR 180s AF. Lactic acid was 8.6, initial Hgb 10.3 (baseline ~11).  He was treated w/ IV Diltiazem, Typed,screened and transfused 1 u PRBC. PCCM asked to admit.    Subjective: A/O 4, NAD, patient joking with staff. He denies any headache chest or abdominal pain, no blood in stool or melena.    Assessment & Plan:   Active Problems:   Lactic acidosis   GIB (gastrointestinal bleeding)   Melena   Acute blood loss anemia   Coronary artery disease involving coronary bypass graft of native heart without angina pectoris   Chronic anticoagulation   Atrial fibrillation with RVR (HCC)   Chronic diastolic CHF (congestive heart failure) (HCC)   Acute renal failure superimposed on stage 4 chronic kidney disease (HCC)   Uncontrolled type 2 diabetes mellitus with complication (HCC)   Type 2 diabetes mellitus with ketoacidotic coma, without long-term current use of insulin    COPD -Albuterol PRN -DuoNeb BID -Solu-Medrol 20 mg BID  OSA -CPAP per respiratory    Ischemic cardiomyopathyw/ EF 20% -Strict in and out since admission + 2.1 L -Daily weight - Resume home dose oral diuretic required IV Lasix in light of 11/24/2016, stop IV fluids once taking diet orally Filed Weights   11/24/16 1400 11/25/16 0619 11/25/16 0842  Weight: 89.5 kg (197 lb 5 oz) 85.5  kg (188 lb 8 oz) 85.3 kg (188 lb)    Chr.A-Fib  w/ RVR [place] vasc 2 score of at least 4. -Rate controlled on diltiazem drip. DC -Resume home dose Coreg - Eliquis on hold due to GI bleed  CAD in native artery -See A. fib with RVR  Chronic Diastolic CHF -See A. fib with RVR  Acute on CKD stage IV(baseline 1.76 -2.0)   Lab Results  Component Value Date   CREATININE 2.05 (H) 11/25/2016   CREATININE 1.91 (H) 11/24/2016   CREATININE 2.07 (H) 11/24/2016    Melena/UGIB? With acute blood loss related anemia requiring one unit of packed RBC transfusion on admission. -1/2 S/P EGD Which was unremarkable, due for colonoscopy on 11/25/2016, currently H&H stable after 1 unit of packed RBC transfusion will continue to monitor. On oral PPI.    Elevated Ammonia  -Trend  Acute Blood Loss Anemia  -Hold NOAC -Trend CBC  Recent Labs Lab 11/22/16 1651 11/23/16 0606 11/23/16 2124 11/24/16 0350 11/25/16 0152  HGB 10.1* 9.4* 9.0* 8.7* 9.7*  -Transfuse for hemoglobin<8   Probable relative adrenal dysfxn (steroids for gout) Reduced Solu-Medrol to once a day  DM Type 2 uncontrolled with complication/DKA -DKA resolved -Lantus 10 units Increased to twice a day -Moderate SSI  CBG (last 3)   Recent Labs  11/24/16 2306 11/25/16 0347 11/25/16 0808  GLUCAP 290* 249* 222*     Leukocytosis. Appears somewhat chronic, he is afebrile, UA chest  x-ray unremarkable. Repeat chest x-ray. For now hold antibiotics. If stable outpatient age-appropriate PCP workup.  Generalized weakness -Hold sedating meds , Commence PT.    DVT prophylaxis:  SCD Code Status: Full Family Communication: Wife present at bedside Disposition Plan: Per GI   Consultants:   GI consulted  Procedures/Significant Events:  12/30 transfuse one unit PRBC  11/25/15 - EGD by Dr. Leone Payor unremarkable. 11/26/2015. Colonoscopy due   VENTILATOR SETTINGS: NA   Cultures 1/2 Blood culture  1/2 urine  pending    Antimicrobials: Anti-infectives    Start     Stop   11/21/16 1800  levofloxacin (LEVAQUIN) IVPB 500 mg  Status:  Discontinued     11/21/16 2033   11/21/16 1730  vancomycin (VANCOCIN) 1,250 mg in sodium chloride 0.9 % 250 mL IVPB  Status:  Discontinued     11/21/16 2033       Devices NA  LINES / TUBES:   Continuous Infusions: . sodium chloride    . insulin (NOVOLIN-R) infusion Stopped (11/24/16 1725)     Objective: Vitals:   11/25/16 0348 11/25/16 0619 11/25/16 0700 11/25/16 0842  BP: (!) 111/58  138/66 (!) 165/82  Pulse: 92  93 100  Resp: (!) 21  (!) 24 20  Temp: 98.4 F (36.9 C)  97.9 F (36.6 C) 97.7 F (36.5 C)  TempSrc: Oral  Oral Oral  SpO2: 99%  100% 100%  Weight:  85.5 kg (188 lb 8 oz)  85.3 kg (188 lb)  Height:    5\' 7"  (1.702 m)    Intake/Output Summary (Last 24 hours) at 11/25/16 0930 Last data filed at 11/25/16 0600  Gross per 24 hour  Intake          3304.19 ml  Output             1400 ml  Net          1904.19 ml   Filed Weights   11/24/16 1400 11/25/16 0619 11/25/16 0842  Weight: 89.5 kg (197 lb 5 oz) 85.5 kg (188 lb 8 oz) 85.3 kg (188 lb)    Examination:  General: A/O 4, NAD, No acute respiratory distress Eyes: negative scleral hemorrhage, negative anisocoria, negative icterus ENT: Negative Runny nose, negative gingival bleeding, Neck:  Negative scars, masses, torticollis, lymphadenopathy, JVD Lungs: Clear to auscultation bilaterally without wheezes or crackles Cardiovascular: Regular rate and rhythm without murmur gallop or rub normal S1 and S2 Abdomen: negative abdominal pain, nondistended, positive soft, bowel sounds, no rebound, no ascites, no appreciable mass Extremities: No significant cyanosis, clubbing, or edema bilateral lower extremities Skin: Negative rashes, lesions, ulcers Psychiatric:  Negative depression, negative anxiety, negative fatigue, negative mania  Central nervous system:  Cranial nerves II through XII  intact, tongue/uvula midline, all extremities muscle strength 5/5, sensation intact throughout, negative dysarthria, negative expressive aphasia, negative receptive aphasia.  .     Data Reviewed: Care during the described time interval was provided by me .  I have reviewed this patient's available data, including medical history, events of note, physical examination, and all test results as part of my evaluation. I have personally reviewed and interpreted all radiology studies.  CBC:  Recent Labs Lab 11/21/16 1617 11/21/16 2154 11/22/16 0432  11/22/16 1651 11/23/16 0606 11/23/16 2124 11/24/16 0350 11/25/16 0152  WBC 22.8* 17.1* 16.2*  --   --   --   --  19.0* 21.8*  NEUTROABS 18.7*  --   --   --   --   --   --   --   --  HGB 10.3* 10.5* 9.9*  < > 10.1* 9.4* 9.0* 8.7* 9.7*  HCT 32.0* 31.6* 30.0*  < > 30.6* 28.5* 27.3* 26.2* 29.3*  MCV 82.1 82.1 81.5  --   --   --   --  82.4 83.0  PLT 218 161 154  --   --   --   --  194 225  < > = values in this interval not displayed. Basic Metabolic Panel:  Recent Labs Lab 11/23/16 0606  11/23/16 1951 11/23/16 2337 11/24/16 0350 11/24/16 0731 11/25/16 0152  NA 136  --  127* 128* 132* 133* 139  K 3.4*  --  4.7 4.5 4.1 4.2 5.1  CL 97*  --  89* 93* 98* 99* 103  CO2 27  --  22 22 25 24 24   GLUCOSE 224*  < > 616* 515* 305* 160* 279*  BUN 53*  --  60* 60* 57* 59* 54*  CREATININE 2.26*  --  2.45* 2.20* 2.07* 1.91* 2.05*  CALCIUM 8.8*  --  8.7* 8.5* 8.7* 8.7* 8.7*  MG 2.4  --   --   --   --   --  2.4  < > = values in this interval not displayed. GFR: Estimated Creatinine Clearance: 27.5 mL/min (by C-G formula based on SCr of 2.05 mg/dL (H)). Liver Function Tests:  Recent Labs Lab 11/21/16 1617 11/24/16 0350  AST 28 17  ALT 17 14*  ALKPHOS 70 60  BILITOT 1.2 0.6  PROT 6.3* 5.8*  ALBUMIN 3.3* 2.8*   No results for input(s): LIPASE, AMYLASE in the last 168 hours.  Recent Labs Lab 11/21/16 2153  AMMONIA 37*   Coagulation  Profile:  Recent Labs Lab 11/21/16 1617  INR 1.40   Cardiac Enzymes: No results for input(s): CKTOTAL, CKMB, CKMBINDEX, TROPONINI in the last 168 hours. BNP (last 3 results) No results for input(s): PROBNP in the last 8760 hours. HbA1C: No results for input(s): HGBA1C in the last 72 hours. CBG:  Recent Labs Lab 11/24/16 1726 11/24/16 1927 11/24/16 2306 11/25/16 0347 11/25/16 0808  GLUCAP 147* 224* 290* 249* 222*   Lipid Profile: No results for input(s): CHOL, HDL, LDLCALC, TRIG, CHOLHDL, LDLDIRECT in the last 72 hours. Thyroid Function Tests: No results for input(s): TSH, T4TOTAL, FREET4, T3FREE, THYROIDAB in the last 72 hours. Anemia Panel: No results for input(s): VITAMINB12, FOLATE, FERRITIN, TIBC, IRON, RETICCTPCT in the last 72 hours. Urine analysis:    Component Value Date/Time   COLORURINE YELLOW 11/24/2016 2031   APPEARANCEUR CLEAR 11/24/2016 2031   LABSPEC 1.013 11/24/2016 2031   PHURINE 5.0 11/24/2016 2031   GLUCOSEU 50 (A) 11/24/2016 2031   HGBUR NEGATIVE 11/24/2016 2031   BILIRUBINUR NEGATIVE 11/24/2016 2031   KETONESUR NEGATIVE 11/24/2016 2031   PROTEINUR NEGATIVE 11/24/2016 2031   NITRITE NEGATIVE 11/24/2016 2031   LEUKOCYTESUR NEGATIVE 11/24/2016 2031   Sepsis Labs: @LABRCNTIP (procalcitonin:4,lacticidven:4)  ) Recent Results (from the past 240 hour(s))  MRSA PCR Screening     Status: None   Collection Time: 11/21/16  8:32 PM  Result Value Ref Range Status   MRSA by PCR NEGATIVE NEGATIVE Final    Comment:        The GeneXpert MRSA Assay (FDA approved for NASAL specimens only), is one component of a comprehensive MRSA colonization surveillance program. It is not intended to diagnose MRSA infection nor to guide or monitor treatment for MRSA infections.          Radiology Studies: Dg Chest [facility]  Result Date: 11/24/2016 CLINICAL DATA:  Initial evaluation for acute shortness of breath. EXAM: PORTABLE CHEST 1 VIEW COMPARISON:   Prior radiograph from 11/21/2016. FINDINGS: Median sternotomy wires with underlying CABG markers and surgical clips, stable. Mild cardiomegaly is unchanged. Mediastinal silhouette within normal limits. Aortic atherosclerosis noted. Lungs normally inflated. No focal infiltrates. No pulmonary edema or pleural effusion. No pneumothorax. No acute osseous abnormality. IMPRESSION: 1. No active cardiopulmonary disease. 2. Mild cardiomegaly with sequelae of prior CABG. Electronically Signed   By: Rise Mu M.D.   On: 11/24/2016 22:00        Scheduled Meds: . allopurinol  100 mg Oral Daily  . carvedilol  25 mg Oral BID WC  . [MAR Hold] diltiazem  60 mg Oral Q6H  . [MAR Hold] insulin aspart  0-15 Units Subcutaneous Q4H  . insulin glargine  10 Units Subcutaneous BID  . [MAR Hold] ipratropium-albuterol  3 mL Nebulization BID  . [START ON 11/26/2016] methylPREDNISolone (SOLU-MEDROL) injection  20 mg Intravenous Daily  . [MAR Hold] mirtazapine  15 mg Oral QHS  . [MAR Hold] pantoprazole  40 mg Oral BID AC  . terazosin  2 mg Oral QHS  . tiotropium  18 mcg Inhalation Daily  . torsemide  20 mg Oral BID   Continuous Infusions: . sodium chloride    . insulin (NOVOLIN-R) infusion Stopped (11/24/16 1725)     LOS: 4 days    Time spent: 40 minutes    Leroy Sea, MD [facility] Pager [phone]  If 7PM-7AM, please contact night-coverage [email or web address] Password [email or web address] 11/25/2016, 9:30 AM

## 2016-11-25 NOTE — Care Management Note (Signed)
Case Management Note  Patient Details  Name: Devin Heath MRN: [id number] Date of Birth: 1929/08/24  Subjective/Objective:   Presents with GIB , lives with wife, has a rollator and a cane at home, and home oxygen thru [facility] (2 liters).  Colonscopy and EGD was negative, patient swallowed pill for capsule study.  NCM will cont to follow for dc needs.                  Action/Plan:   Expected Discharge Date:                  Expected Discharge Plan:  Home/Self Care  In-House Referral:     Discharge planning Services  CM Consult  Post Acute Care Choice:    Choice offered to:     DME Arranged:    DME Agency:     HH Arranged:    HH Agency:     Status of Service:  Completed, signed off  If discussed at [employer] of Stay Meetings, dates discussed:    Additional Comments:  Leone Haven, RN 11/25/2016, 3:16 PM

## 2016-11-25 NOTE — Anesthesia Preprocedure Evaluation (Signed)
Anesthesia Evaluation  Patient identified by MRN, date of birth, ID band Patient awake    Reviewed: Allergy & Precautions, NPO status , Patient's Chart, lab work & pertinent test results, reviewed documented beta blocker date and time   Airway Mallampati: I  TM Distance: >3 FB Neck ROM: Full    Dental  (+) Edentulous Upper, Edentulous Lower   Pulmonary sleep apnea and Continuous Positive Airway Pressure Ventilation , COPD,  COPD inhaler and oxygen dependent, former smoker,    breath sounds clear to auscultation       Cardiovascular hypertension, Pt. on home beta blockers + CAD and +CHF  + dysrhythmias Atrial Fibrillation  Rhythm:Irregular Rate:Normal     Neuro/Psych negative neurological ROS  negative psych ROS   GI/Hepatic Neg liver ROS, GERD  Medicated,  Endo/Other  diabetes, Type 2, Insulin Dependent  Renal/GU Renal InsufficiencyRenal disease  negative genitourinary   Musculoskeletal  (+) Arthritis , Rheumatoid disorders,    Abdominal   Peds negative pediatric ROS (+)  Hematology negative hematology ROS (+)   Anesthesia Other Findings   Reproductive/Obstetrics negative OB ROS                             Lab Results  Component Value Date   WBC 21.8 (H) 11/25/2016   HGB 9.7 (L) 11/25/2016   HCT 29.3 (L) 11/25/2016   MCV 83.0 11/25/2016   PLT 225 11/25/2016   Lab Results  Component Value Date   CREATININE 2.05 (H) 11/25/2016   BUN 54 (H) 11/25/2016   NA 139 11/25/2016   K 5.1 11/25/2016   CL 103 11/25/2016   CO2 24 11/25/2016   Lab Results  Component Value Date   INR 1.40 11/21/2016   10/2016 EKG: atrial fibrillation.  09/2016 Echo - Left ventricle: The cavity size was normal. Wall thickness was   normal. Systolic function was normal. The estimated ejection   fraction was in the range of 60% to 65%. Wall motion was normal;   there were no regional wall motion  abnormalities. - Aortic valve: Mildly calcified annulus. Mildly thickened   leaflets. There was trivial regurgitation. - Mitral valve: There was mild regurgitation. - Pulmonary arteries: Systolic pressure was mildly increased. PA   peak pressure: 31 mm Hg (S).   Anesthesia Physical  Anesthesia Plan  ASA: III  Anesthesia Plan: MAC   Post-op Pain Management:    Induction: Intravenous  Airway Management Planned: Natural Airway  Additional Equipment:   Intra-op Plan:   Post-operative Plan:   Informed Consent: I have reviewed the patients History and Physical, chart, labs and discussed the procedure including the risks, benefits and alternatives for the proposed anesthesia with the patient or authorized representative who has indicated his/her understanding and acceptance.   Dental advisory given  Plan Discussed with: CRNA  Anesthesia Plan Comments:         Anesthesia Quick Evaluation

## 2016-11-25 NOTE — Progress Notes (Signed)
Inpatient Diabetes Program Recommendations  AACE/ADA: New Consensus Statement on Inpatient Glycemic Control (2015)  Target Ranges:  Prepandial:   less than 140 mg/dL      Peak postprandial:   less than 180 mg/dL (1-2 hours)      Critically ill patients:  140 - 180 mg/dL   Lab Results  Component Value Date   GLUCAP 222 (H) 11/25/2016   HGBA1C 10.2 (H) 10/13/2016    Review of Glycemic Control Results for Devin Heath, Devin Heath (MRN [id number]) as of 11/25/2016 10:29  Ref. Range 11/24/2016 16:21 11/24/2016 17:26 11/24/2016 19:27 11/24/2016 23:06 11/25/2016 03:47 11/25/2016 08:08  Glucose-Capillary Latest Ref Range: 65 - 99 mg/dL 350 (H) 093 (H) 818 (H) 290 (H) 249 (H) 222 (H)   Diabetes history: DM 2 Outpatient Diabetes medications: Toujeo 40 units, Apidra 14 units TID meal coverage Current orders for Inpatient glycemic control: Lantus 10 units bid, Novolog Moderate Q4 hours  Inpatient Diabetes Program Recommendations:  Note patient currently in endoscopy. CBGs elevated > 200 since IV insulin drip transitioned off.  Please consider: -Increase Lantus to 40 units daily -Novolog 10 units tid meal coverage if eats 50% meals when intake restarted  Thank you, Billy Fischer. Naasia Weilbacher, RN, MSN, CDE Inpatient Glycemic Control Team Team Pager [phone] (8am-5pm) 11/25/2016 10:35 AM

## 2016-11-26 ENCOUNTER — Inpatient Hospital Stay (HOSPITAL_COMMUNITY): Payer: Medicare Other

## 2016-11-26 DIAGNOSIS — N172 Acute kidney failure with medullary necrosis: Secondary | ICD-10-CM

## 2016-11-26 DIAGNOSIS — I2510 Atherosclerotic heart disease of native coronary artery without angina pectoris: Secondary | ICD-10-CM

## 2016-11-26 DIAGNOSIS — N178 Other acute kidney failure: Secondary | ICD-10-CM

## 2016-11-26 DIAGNOSIS — M542 Cervicalgia: Secondary | ICD-10-CM

## 2016-11-26 DIAGNOSIS — I255 Ischemic cardiomyopathy: Secondary | ICD-10-CM

## 2016-11-26 DIAGNOSIS — M4810 Ankylosing hyperostosis [Forestier], site unspecified: Secondary | ICD-10-CM

## 2016-11-26 DIAGNOSIS — I420 Dilated cardiomyopathy: Secondary | ICD-10-CM

## 2016-11-26 DIAGNOSIS — K922 Gastrointestinal hemorrhage, unspecified: Secondary | ICD-10-CM

## 2016-11-26 LAB — CBC WITH DIFFERENTIAL/PLATELET
BASOS ABS: 0 10*3/uL (ref 0.0–0.1)
Basophils Relative: 0 %
EOS ABS: 0.1 10*3/uL (ref 0.0–0.7)
EOS PCT: 1 %
HCT: 27.4 % — ABNORMAL LOW (ref 39.0–52.0)
Hemoglobin: 8.7 g/dL — ABNORMAL LOW (ref 13.0–17.0)
Lymphocytes Relative: 7 %
Lymphs Abs: 0.9 10*3/uL (ref 0.7–4.0)
MCH: 27.6 pg (ref 26.0–34.0)
MCHC: 31.8 g/dL (ref 30.0–36.0)
MCV: 87 fL (ref 78.0–100.0)
Monocytes Absolute: 0.9 10*3/uL (ref 0.1–1.0)
Monocytes Relative: 7 %
Neutro Abs: 10.5 10*3/uL — ABNORMAL HIGH (ref 1.7–7.7)
Neutrophils Relative %: 85 %
PLATELETS: 149 10*3/uL — AB (ref 150–400)
RBC: 3.15 MIL/uL — AB (ref 4.22–5.81)
RDW: 15.8 % — AB (ref 11.5–15.5)
WBC: 12.5 10*3/uL — AB (ref 4.0–10.5)

## 2016-11-26 LAB — GLUCOSE, CAPILLARY
GLUCOSE-CAPILLARY: 96 mg/dL (ref 65–99)
GLUCOSE-CAPILLARY: 220 mg/dL — AB (ref 65–99)
Glucose-Capillary: 161 mg/dL — ABNORMAL HIGH (ref 65–99)
Glucose-Capillary: 336 mg/dL — ABNORMAL HIGH (ref 65–99)
Glucose-Capillary: 335 mg/dL — ABNORMAL HIGH (ref 65–99)
Glucose-Capillary: 305 mg/dL — ABNORMAL HIGH (ref 65–99)

## 2016-11-26 LAB — BASIC METABOLIC PANEL
ANION GAP: 8 (ref 5–15)
BUN: 47 mg/dL — ABNORMAL HIGH (ref 6–20)
CALCIUM: 8.2 mg/dL — AB (ref 8.9–10.3)
CO2: 27 mmol/L (ref 22–32)
Chloride: 104 mmol/L (ref 101–111)
Creatinine, Ser: 1.85 mg/dL — ABNORMAL HIGH (ref 0.61–1.24)
GFR, EST AFRICAN AMERICAN: 37 mL/min — AB (ref >60)
GFR, EST NON AFRICAN AMERICAN: 32 mL/min — AB (ref >60)
GLUCOSE: 97 mg/dL (ref 65–99)
Potassium: 3.2 mmol/L — ABNORMAL LOW (ref 3.5–5.1)
Sodium: 139 mmol/L (ref 135–145)

## 2016-11-26 LAB — URINE CULTURE: Culture: <10000 — AB

## 2016-11-26 LAB — TROPONIN I: Troponin I: <0.03 ng/mL (ref <0.03)

## 2016-11-26 LAB — MAGNESIUM: MAGNESIUM: 2.2 mg/dL (ref 1.7–2.4)

## 2016-11-26 MED ORDER — POTASSIUM CHLORIDE CRYS ER 20 MEQ PO TBCR
60.0000 meq | EXTENDED_RELEASE_TABLET | Freq: Once | ORAL | Status: AC
  Administered 2016-11-26: 60 meq via ORAL
  Filled 2016-11-26: qty 3

## 2016-11-26 MED ORDER — PANTOPRAZOLE SODIUM 40 MG PO TBEC
40.0000 mg | DELAYED_RELEASE_TABLET | Freq: Every day | ORAL | Status: DC
  Administered 2016-11-27 – 2016-12-01 (×5): 40 mg via ORAL
  Filled 2016-11-26 (×5): qty 1

## 2016-11-26 MED ORDER — ACETAMINOPHEN 325 MG PO TABS
650.0000 mg | ORAL_TABLET | Freq: Four times a day (QID) | ORAL | Status: DC | PRN
  Administered 2016-11-26: 650 mg via ORAL
  Filled 2016-11-26: qty 2

## 2016-11-26 MED ORDER — PREDNISONE 5 MG PO TABS
15.0000 mg | ORAL_TABLET | Freq: Every day | ORAL | Status: DC
  Administered 2016-11-27 – 2016-12-01 (×5): 15 mg via ORAL
  Filled 2016-11-26 (×2): qty 2
  Filled 2016-11-26: qty 1
  Filled 2016-11-26 (×2): qty 2

## 2016-11-26 MED ORDER — TRAMADOL HCL 50 MG PO TABS
50.0000 mg | ORAL_TABLET | Freq: Three times a day (TID) | ORAL | Status: DC | PRN
  Administered 2016-11-26 – 2016-11-27 (×2): 50 mg via ORAL
  Filled 2016-11-26 (×2): qty 1

## 2016-11-26 MED ORDER — INSULIN ASPART 100 UNIT/ML ~~LOC~~ SOLN
0.0000 [IU] | Freq: Three times a day (TID) | SUBCUTANEOUS | Status: DC
  Administered 2016-11-27 (×2): 5 [IU] via SUBCUTANEOUS
  Administered 2016-11-27: 8 [IU] via SUBCUTANEOUS
  Administered 2016-11-28: 11 [IU] via SUBCUTANEOUS
  Administered 2016-11-28: 5 [IU] via SUBCUTANEOUS
  Administered 2016-11-28: 3 [IU] via SUBCUTANEOUS
  Administered 2016-11-29: 11 [IU] via SUBCUTANEOUS
  Administered 2016-11-29: 3 [IU] via SUBCUTANEOUS
  Administered 2016-11-29: 5 [IU] via SUBCUTANEOUS
  Administered 2016-11-30: 8 [IU] via SUBCUTANEOUS
  Administered 2016-11-30: 5 [IU] via SUBCUTANEOUS
  Administered 2016-11-30: 8 [IU] via SUBCUTANEOUS
  Administered 2016-12-01: 3 [IU] via SUBCUTANEOUS

## 2016-11-26 MED ORDER — CYCLOBENZAPRINE HCL 5 MG PO TABS
5.0000 mg | ORAL_TABLET | Freq: Once | ORAL | Status: AC
  Administered 2016-11-26: 5 mg via ORAL
  Filled 2016-11-26: qty 1

## 2016-11-26 NOTE — Progress Notes (Addendum)
PROGRESS NOTE    Dervin Vore  WER:[id number] DOB: 23-Sep-1929 DOA: 11/21/2016 PCP: [name], MD   Brief Narrative:  83 year old WM PMHx A.-Fib just started on Eliquis (w/ CHADVASC 5), Ischemic Cardiomyopathy w/ EF 20%, CAD native artery, DM type II uncontrolled with consultation CKD stage IV , COPD on home O2 and OSA on CPAP. F/b lebaeur Cards.   Recently re-located from [place] to be closer to family due to worsening physical decline over the last year. Was. Per pt and family pt had been feeling weaker for the last 2 d PTA. On day of admit pt could not get OOB. Was incoherent. On arrival to ER had large melanic stool. HR 180s AF. Lactic acid was 8.6, initial Hgb 10.3 (baseline ~11).  He was treated w/ IV Diltiazem, Typed,screened and transfused 1 u PRBC. PCCM asked to admit.    Subjective: 1/4 sleepy but arousable, A/O 4, NAD. States his back/neck is bothering him.      Assessment & Plan:   Active Problems:   Lactic acidosis   GIB (gastrointestinal bleeding)   Melena   Acute blood loss anemia   Coronary artery disease involving coronary bypass graft of native heart without angina pectoris   Chronic anticoagulation   Atrial fibrillation with RVR (HCC)   Chronic diastolic CHF (congestive heart failure) (HCC)   Acute renal failure superimposed on stage 4 chronic kidney disease (HCC)   Uncontrolled type 2 diabetes mellitus with complication (HCC)   Type 2 diabetes mellitus with ketoacidotic coma, without long-term current use of insulin    COPD -Albuterol PRN -DuoNeb BID -Solu-Medrol 20 mg BID  OSA -CPAP per respiratory    Ischemic Cardiomyopathyw/ EF 20% -Strict in and out since admission + 3.7 L -Daily weight Filed Weights   11/25/16 0619 11/25/16 0842 11/26/16 0559  Weight: 85.5 kg (188 lb 8 oz) 85.3 kg (188 lb) 86.2 kg (190 lb)  -Coreg 25 mg BID -Torsemide 20 mg BID  A-Fib  w/ RVR -See cardiomyopathy  CAD in native artery -See A. fib with  RVR  Chronic Diastolic CHF -See A. fib with RVR  Acute on CKD stage IV(baseline 1.76 -2.0)   Lab Results  Component Value Date   CREATININE 1.85 (H) 11/26/2016   CREATININE 2.05 (H) 11/25/2016   CREATININE 1.91 (H) 11/24/2016  -baseline  Melena/UGIB? -1/2 S/P EGD: Normal  -1/3 Colonoscopy: Normal  -1/4 capsule results pending  Elevated Ammonia  -Trend  Acute Blood Loss Anemia  -Hold NOAC -Trend CBC  Recent Labs Lab 11/23/16 0606 11/23/16 2124 11/24/16 0350 11/25/16 0152 11/26/16 0600  HGB 9.4* 9.0* 8.7* 9.7* 8.7*  -Transfuse for hemoglobin<8  Probable relative adrenal dysfxn (steroids for gout) -Solu-Medrol 20 mg daily DC 1/4 -Prednisone 15 mg daily start on 1/5  DM Type 2 uncontrolled with complication/DKA -DKA resolved -Lantus 10 units daily -Moderate SSI   DISH; Neck/upper back pain  -C-spine/T-spine x-ray; findings consistent with DISH see results below -Heating pad PRN - Tylenol   Generalized weakness -Most likely secondary to oversedation from narcotics. Patient on fentanyl, Dilaudid. Have DC'd both. DO NOT RESTART NARCOTICS  Hypokalemia -Potassium goal> 4 -K-Dur 60 mEq   Goals of care -Per GI note General decline and at least 5 inpt stays in last several months (in [place] and in [place]), and consult Palliative Care for discussion of CODE STATUS and short-term vs long-term goals care   DVT prophylaxis:  SCD Code Status: Full Family Communication: Wife present at bedside Disposition  Plan: Per GI   Consultants:   GI consulted    Procedures/Significant Events:  12/30 transfuse one unit PRBC  1/2 EGD: Normal 1/3 colonoscopy: Normal 1/4 capsule study pending 1/4 DG C-spine:Multi level ventral syndesmophytes noted which may reflect diffuse idiopathic skeletal hyperostosis (DISH) 1/4 DG T-spine:Moderate to advanced degenerative changes involving the spine with probable changes of DISH.  VENTILATOR  SETTINGS: NA   Cultures 1/2 Blood culture pending 1/2 urine insignificant growth    Antimicrobials: Anti-infectives    Start     Stop   11/21/16 1800  levofloxacin (LEVAQUIN) IVPB 500 mg  Status:  Discontinued     11/21/16 2033   11/21/16 1730  vancomycin (VANCOCIN) 1,250 mg in sodium chloride 0.9 % 250 mL IVPB  Status:  Discontinued     11/21/16 2033       Devices NA  LINES / TUBES:      Continuous Infusions: . sodium chloride 20 mL/hr at 11/25/16 1204  . insulin (NOVOLIN-R) infusion Stopped (11/24/16 1725)     Objective: Vitals:   11/26/16 0248 11/26/16 0559 11/26/16 0700 11/26/16 0830  BP: 123/61  123/62   Pulse: 96  99 99  Resp: 19  (!) 23 15  Temp: 98.8 F (37.1 C)  98.3 F (36.8 C)   TempSrc: Oral  Oral   SpO2: 100%  100% 93%  Weight:  86.2 kg (190 lb)    Height:        Intake/Output Summary (Last 24 hours) at 11/26/16 0848 Last data filed at 11/26/16 0800  Gross per 24 hour  Intake          1098.67 ml  Output             1400 ml  Net          -301.33 ml   Filed Weights   11/25/16 0619 11/25/16 0842 11/26/16 0559  Weight: 85.5 kg (188 lb 8 oz) 85.3 kg (188 lb) 86.2 kg (190 lb)    Examination:  General: Sleepy but arousable A/O 4, NAD, No acute respiratory distress Eyes: negative scleral hemorrhage, negative anisocoria, negative icterus ENT: Negative Runny nose, negative gingival bleeding, Neck:  pain to palpation C6, C7, T1, T2 midline, Negative scars, masses, torticollis, lymphadenopathy, JVD Lungs: Clear to auscultation bilaterally without wheezes or crackles Cardiovascular: Regular rate and rhythm without murmur gallop or rub normal S1 and S2 Abdomen: negative abdominal pain, nondistended, positive soft, bowel sounds, no rebound, no ascites, no appreciable mass Extremities:. No significant cyanosis, clubbing, or edema bilateral lower extremities Skin: Negative rashes, lesions, ulcers Psychiatric:  Negative depression, negative  anxiety, negative fatigue, negative mania  Central nervous system:  Cranial nerves II through XII intact, tongue/uvula midline, all extremities muscle strength 5/5, sensation intact throughout, negative dysarthria, negative expressive aphasia, negative receptive aphasia.  .     Data Reviewed: Care during the described time interval was provided by me .  I have reviewed this patient's available data, including medical history, events of note, physical examination, and all test results as part of my evaluation. I have personally reviewed and interpreted all radiology studies.  CBC:  Recent Labs Lab 11/21/16 1617 11/21/16 2154 11/22/16 0432  11/23/16 0606 11/23/16 2124 11/24/16 0350 11/25/16 0152 11/26/16 0600  WBC 22.8* 17.1* 16.2*  --   --   --  19.0* 21.8* 12.5*  NEUTROABS 18.7*  --   --   --   --   --   --   --  10.5*  HGB 10.3* 10.5* 9.9*  < > 9.4* 9.0* 8.7* 9.7* 8.7*  HCT 32.0* 31.6* 30.0*  < > 28.5* 27.3* 26.2* 29.3* 27.4*  MCV 82.1 82.1 81.5  --   --   --  82.4 83.0 87.0  PLT 218 161 154  --   --   --  194 225 149*  < > = values in this interval not displayed. Basic Metabolic Panel:  Recent Labs Lab 11/23/16 0606  11/23/16 2337 11/24/16 0350 11/24/16 0731 11/25/16 0152 11/26/16 0600  NA 136  < > 128* 132* 133* 139 139  K 3.4*  < > 4.5 4.1 4.2 5.1 3.2*  CL 97*  < > 93* 98* 99* 103 104  CO2 27  < > 22 25 24 24 27   GLUCOSE 224*  < > 515* 305* 160* 279* 97  BUN 53*  < > 60* 57* 59* 54* 47*  CREATININE 2.26*  < > 2.20* 2.07* 1.91* 2.05* 1.85*  CALCIUM 8.8*  < > 8.5* 8.7* 8.7* 8.7* 8.2*  MG 2.4  --   --   --   --  2.4 2.2  < > = values in this interval not displayed. GFR: Estimated Creatinine Clearance: 30.6 mL/min (by C-G formula based on SCr of 1.85 mg/dL (H)). Liver Function Tests:  Recent Labs Lab 11/21/16 1617 11/24/16 0350  AST 28 17  ALT 17 14*  ALKPHOS 70 60  BILITOT 1.2 0.6  PROT 6.3* 5.8*  ALBUMIN 3.3* 2.8*   No results for input(s): LIPASE,  AMYLASE in the last 168 hours.  Recent Labs Lab 11/21/16 2153  AMMONIA 37*   Coagulation Profile:  Recent Labs Lab 11/21/16 1617  INR 1.40   Cardiac Enzymes: No results for input(s): CKTOTAL, CKMB, CKMBINDEX, TROPONINI in the last 168 hours. BNP (last 3 results) No results for input(s): PROBNP in the last 8760 hours. HbA1C: No results for input(s): HGBA1C in the last 72 hours. CBG:  Recent Labs Lab 11/25/16 1526 11/25/16 1941 11/25/16 2304 11/26/16 0252 11/26/16 0759  GLUCAP 206* 337* 265* 161* 96   Lipid Profile: No results for input(s): CHOL, HDL, LDLCALC, TRIG, CHOLHDL, LDLDIRECT in the last 72 hours. Thyroid Function Tests: No results for input(s): TSH, T4TOTAL, FREET4, T3FREE, THYROIDAB in the last 72 hours. Anemia Panel: No results for input(s): VITAMINB12, FOLATE, FERRITIN, TIBC, IRON, RETICCTPCT in the last 72 hours. Urine analysis:    Component Value Date/Time   COLORURINE YELLOW 11/24/2016 2031   APPEARANCEUR CLEAR 11/24/2016 2031   LABSPEC 1.013 11/24/2016 2031   PHURINE 5.0 11/24/2016 2031   GLUCOSEU 50 (A) 11/24/2016 2031   HGBUR NEGATIVE 11/24/2016 2031   BILIRUBINUR NEGATIVE 11/24/2016 2031   KETONESUR NEGATIVE 11/24/2016 2031   PROTEINUR NEGATIVE 11/24/2016 2031   NITRITE NEGATIVE 11/24/2016 2031   LEUKOCYTESUR NEGATIVE 11/24/2016 2031   Sepsis Labs: @LABRCNTIP (procalcitonin:4,lacticidven:4)  ) Recent Results (from the past 240 hour(s))  MRSA PCR Screening     Status: None   Collection Time: 11/21/16  8:32 PM  Result Value Ref Range Status   MRSA by PCR NEGATIVE NEGATIVE Final    Comment:        The GeneXpert MRSA Assay (FDA approved for NASAL specimens only), is one component of a comprehensive MRSA colonization surveillance program. It is not intended to diagnose MRSA infection nor to guide or monitor treatment for MRSA infections.          Radiology Studies: Dg Chest Port 1 View  Result Date: 11/25/2016 CLINICAL DATA:  Leukocytosis. EXAM: PORTABLE CHEST 1 VIEW COMPARISON:  11/24/2016 FINDINGS: Prior CABG. Heart is borderline in size. Diffuse interstitial prominence throughout the lungs with peribronchial thickening. Mild hyperinflation. No confluent opacity or effusion. No acute bony abnormality. IMPRESSION: Hyperinflation/ COPD.  Chronic bronchitic changes. Electronically Signed   By: Charlett Nose M.D.   On: 11/25/2016 09:41   Dg Chest Port 1 View  Result Date: 11/24/2016 CLINICAL DATA:  Initial evaluation for acute shortness of breath. EXAM: PORTABLE CHEST 1 VIEW COMPARISON:  Prior radiograph from 11/21/2016. FINDINGS: Median sternotomy wires with underlying CABG markers and surgical clips, stable. Mild cardiomegaly is unchanged. Mediastinal silhouette within normal limits. Aortic atherosclerosis noted. Lungs normally inflated. No focal infiltrates. No pulmonary edema or pleural effusion. No pneumothorax. No acute osseous abnormality. IMPRESSION: 1. No active cardiopulmonary disease. 2. Mild cardiomegaly with sequelae of prior CABG. Electronically Signed   By: Rise Mu M.D.   On: 11/24/2016 22:00        Scheduled Meds: . allopurinol  100 mg Oral Daily  . carvedilol  25 mg Oral BID WC  . insulin aspart  0-15 Units Subcutaneous Q4H  . insulin glargine  10 Units Subcutaneous BID  . ipratropium-albuterol  3 mL Nebulization BID  . methylPREDNISolone (SOLU-MEDROL) injection  20 mg Intravenous Daily  . mirtazapine  15 mg Oral QHS  . pantoprazole  40 mg Oral BID AC  . terazosin  2 mg Oral QHS  . tiotropium  18 mcg Inhalation Daily  . torsemide  20 mg Oral BID   Continuous Infusions: . sodium chloride 20 mL/hr at 11/25/16 1204  . insulin (NOVOLIN-R) infusion Stopped (11/24/16 1725)     LOS: 5 days    Time spent: 40 minutes    WOODS, Roselind Messier, MD [facility] Pager [phone]   If 7PM-7AM, please contact night-coverage [email or web address] Password [facility] 11/26/2016, 8:48 AM

## 2016-11-26 NOTE — Care Management Note (Signed)
Case Management Note  Patient Details  Name: Devin Heath MRN: [id number] Date of Birth: 1929/02/25  Subjective/Objective:    Presents with GIB , lives with wife, has a rollator and a cane at home, and home oxygen thru [employer] (2 liters).  Colonscopy and EGD was negative, patient swallowed pill for capsule study. Per pt eval rec SNF, CSW aware.  NCM will cont to follow for dc needs.                                Action/Plan:   Expected Discharge Date:                  Expected Discharge Plan:  Skilled Nursing Facility  In-House Referral:  Clinical Social Work  Discharge planning Services  CM Consult  Post Acute Care Choice:    Choice offered to:     DME Arranged:    DME Agency:     HH Arranged:    HH Agency:     Status of Service:  Completed, signed off  If discussed at [employer], dates discussed:    Additional Comments:  Leone Haven, RN 11/26/2016, 2:46 PM

## 2016-11-26 NOTE — Progress Notes (Signed)
Palliative Medicine consult noted. Due to high referral volume, there may be a delay seeing this patient. Please call the Palliative Medicine Team office at [phone] if recommendations are needed in the interim.  Thank you for inviting [place] to see this patient.  Margret Chance Zebulin Siegel, RN, BSN, [facility] 11/26/2016 3:09 PM Cell [phone] 8:00-4:00 Monday-Friday Office [phone]

## 2016-11-26 NOTE — Progress Notes (Signed)
Daily Rounding Note  ASSESMENT:   *  GI bleed.  Melena.  In setting of Eliquis for A fib.   11/24/16 EGD: normal.  11/25/16 colonoscopy normal.  11/26/16 capsule endoscopy in progress.   *  Blood loss anemia.  S/p PRBC x 1 on 11/25/15 CKD 3-4 may be contributing to a chronic anemia.  Also with his hx of epistaxis when  *  Afib.  Started on Eliquis  ~12/19 by Dr Shirlee Latch for CHADSVASC =5.  Currently in SR on monitor.  Eliquis on hold.   *  Hypokalemia.  No potassium orders in place  *  Likley adrenal insufficiency due to chronic steroids for gout. On IV Solumedrol currently  *  IDDM  *  General decline and at least 5 inpt stays in last several months (in [place] and in [place]).  Wonder if pt and spouse have ever discussed goals of care with Pall Care?    PLAN   *  Await findings of capsule endo.    *  Defer correction of potassium to Dr Joseph Art.   *  Pt appropriate for and would benefit from goals of care discussion.   *  Switch to once daily PPI.    Devin Heath  11/26/2016, 9:56 AM Pager: [phone]  11/26/2016, 9:56 AM  LOS: 5 days      [facility] Attending   I have taken an interval history, reviewed the chart and examined the patient. I agree with the Advanced Practitioner's note, impression and recommendations.   Capsule endo (as well as EGD and colonoscopy) negative for source of bleeding. Patient and wife do not want to retry Eliquis given bleeding. Not wrong to retry but understand. Perhaps he did have epistaxis as cause of bleeding here.  Nothing further for me to add. See GI prn  Signing off  Iva Boop, MD, [facility] [phone] (pager) [phone] after 5 PM, weekends and holidays  11/26/2016 4:14 PM    SUBJECTIVE:   Chief complaint: dark, bloody stool.  Since colon prep, has not had any BMs Capsule endo completed, needs to be read.   C/o intermittent scapular and chest  pain.  Wife says scapular pain is his anginal equivalent.  EKG performed, not yet interpreted but does not look ischemic. Pt very sleepy,  But arouseable.  Wife says he is eating pretty well. She also shares pt had never tolerated platelet disrupting agents or blood thinners in past due to excessive bleeding, specifically epistaxis and even with simple 81 ASA.  After starting Eliquis on 12/19 he did have minor epistaxis.   OBJECTIVE:         Vital signs in last 24 hours:    Temp:  [97.3 F (36.3 C)-99 F (37.2 C)] 98.3 F (36.8 C) (01/04 0700) Pulse Rate:  [84-104] 104 (01/04 0901) Resp:  [14-23] 22 (01/04 0901) BP: (108-154)/(52-86) 145/78 (01/04 0901) SpO2:  [93 %-100 %] 100 % (01/04 0901) FiO2 (%):  [28 %] 28 % (01/03 1148) Weight:  [86.2 kg (190 lb)] 86.2 kg (190 lb) (01/04 0559) Last BM Date: 11/25/16 Filed Weights   11/25/16 0619 11/25/16 0842 11/26/16 0559  Weight: 85.5 kg (188 lb 8 oz) 85.3 kg (188 lb) 86.2 kg (190 lb)   General: frail.  Aged.  Sleeping quietly   Heart: slightly tachy in low 100s, SR on monitors over 10 minute interval Chest: no labored resps or cough Abdomen: soft, NT, ND.  BS hypoactive  Extremities: no CCE Neuro/Psych:  Arouseable, follows commands but drowsy.   I Lab Results:  Recent Labs  11/24/16 0350 11/25/16 0152 11/26/16 0600  WBC 19.0* 21.8* 12.5*  HGB 8.7* 9.7* 8.7*  HCT 26.2* 29.3* 27.4*  PLT 194 225 149*

## 2016-11-26 NOTE — Procedures (Addendum)
Indication: Melena (EGD and colonoscopy negative)  Findings:  Normal small bowel - no cause of melena seen  Recommendation:  Consider restart Eliquis and observe (ok to dc). Note he has some hx epistaxis issues and had a "liitle one" around time of melena so ? If that is what happened.  See GI prn  F/u Hgb and iron through PCP

## 2016-11-26 NOTE — Progress Notes (Signed)
Spoke with wife and daughter at bedside for an extended time period.  Daughter would like updates on specific plans/goals and she feels she is not getting these updates acurately from her mother.  The family is upset about the test results not showing the source of the bleed.  I discussed the patients risk when taking the medication Eliquis, his atrial fibrillation, his recent falls and hospitalizations, and the likelihood the patient will need skilled assistance upon discharge.  Family still concerned and would like an update on "the plan" from the MD.  I mentioned that Dr. Joseph Art discussed all these things with the patients wife this morning and I was able to confirm this with Dr. Joseph Art.  In addition, I contacted social work and informed them that PT is recommending SNF.  Will continue to monitor and provide education to patient and family.

## 2016-11-26 NOTE — Evaluation (Signed)
Physical Therapy Evaluation Patient Details Name: Devin Heath MRN: [id number] DOB: 25-Jul-1929 Today's Date: 11/26/2016   History of Present Illness  pt presents with GIB, A-Fib, and Lactic Acidosis.  pt with overall decline over the course the the last "couple months" per wife.  pt with hx of Ischemic Cardiomyopathy, A-fib, CKD, COPD, CAD. Gout, DM, HF, RA, and RLS.    Clinical Impression  Pt globally weak and debilitated.  Per wife pt has been declining at home over the last couple months and has had multiple hospital admits over past 6 months.  A this time feel pt is going to require SNF level of therapies and nsg care at D/C.  Pt's wife seems to be expecting a full recovery for pt and unsure if wife would be receptive to Palliative Care Consult to determine GOC.  Will continue to follow.      Follow Up Recommendations SNF    Equipment Recommendations  None recommended by PT    Recommendations for Other Services       Precautions / Restrictions Precautions Precautions: Fall Restrictions Weight Bearing Restrictions: No      Mobility  Bed Mobility Overal bed mobility: Needs Assistance Bed Mobility: Supine to Sit     Supine to sit: Mod assist;HOB elevated     General bed mobility comments: pt needs cues for sequencing and A for LEs and bringing trunk up to sitting.    Transfers Overall transfer level: Needs assistance Equipment used: Rolling walker (2 wheeled) Transfers: Sit to/from [employer] Sit to Stand: Mod assist Stand pivot transfers: Min assist       General transfer comment: pt needs cues for UE use and movement through pivot to recliner.  pt generally weak and with DOE, but O2 sats remained in 90s on 2L O2.  pt indicates fatigued after transfer.    Ambulation/Gait                Stairs            Wheelchair Mobility    Modified Rankin (Stroke Patients Only)       Balance Overall balance assessment: Needs  assistance Sitting-balance support: Bilateral upper extremity supported;Feet supported Sitting balance-Leahy Scale: Poor Sitting balance - Comments: pt seems to use UE support more for fatigue and energy conservation rather than balance.     Standing balance support: During functional activity;Bilateral upper extremity supported Standing balance-Leahy Scale: Poor                               Pertinent Vitals/Pain Pain Assessment: No/denies pain    Home Living Family/patient expects to be discharged to:: Skilled nursing facility Living Arrangements: Spouse/significant other                    Prior Function Level of Independence: Needs assistance   Gait / Transfers Assistance Needed: Uses 4WW for ambulation  ADL's / Homemaking Assistance Needed: Wife performs homemaking tasks and A with ADLs as needed.          Hand Dominance        Extremity/Trunk Assessment   Upper Extremity Assessment Upper Extremity Assessment: Generalized weakness    Lower Extremity Assessment Lower Extremity Assessment: Generalized weakness    Cervical / Trunk Assessment Cervical / Trunk Assessment: Kyphotic  Communication   Communication: HOH  Cognition Arousal/Alertness:  (Drowsy, but arousable) Behavior During Therapy: Flat affect Overall Cognitive Status: Difficult to  assess                 General Comments: pt is HOH and generally drowsy.  pt will arouse when name is called, but will quickly drift back to sleep when not stimulated.  Slow to respond, but does participate.      General Comments      Exercises     Assessment/Plan    PT Assessment Patient needs continued PT services  PT Problem List Decreased strength;Decreased activity tolerance;Decreased balance;Decreased mobility;Decreased cognition;Decreased knowledge of use of DME;Cardiopulmonary status limiting activity          PT Treatment Interventions DME instruction;Gait training;Functional  mobility training;Therapeutic activities;Therapeutic exercise;Balance training;Patient/family education    PT Goals (Current goals can be found in the Care Plan section)  Acute Rehab PT Goals Patient Stated Goal: Per wife for pt to return to normal. PT Goal Formulation: With patient/family Time For Goal Achievement: 12/10/16 Potential to Achieve Goals: Fair    Frequency Min 2X/week   Barriers to discharge        Co-evaluation               End of Session Equipment Utilized During Treatment: Gait belt;Oxygen Activity Tolerance: Patient limited by fatigue Patient left: in chair;with call bell/phone within reach;with family/visitor present Nurse Communication: Mobility status         Time: 9242-6834 PT Time Calculation (min) (ACUTE ONLY): 30 min   Charges:   PT Evaluation $PT Eval Moderate Complexity: 1 Procedure PT Treatments $Therapeutic Activity: 8-22 mins   PT G CodesSunny Heath, [place]  [phone] 11/26/2016, 1:41 PM

## 2016-11-26 NOTE — Progress Notes (Signed)
Alerted to patients room by his wife.  Patient stating he is having chest pain and wife states he seems "sleepy" this morning.  Patient stating his chest pain comes and goes and will go away with time.  He has had similar chest pain in the past and states the pain feels like a "pressure" and it is not radiating.  EKG obtained.  Notified MD.  New orders received.  Will continue to monitor.

## 2016-11-26 NOTE — Progress Notes (Signed)
Patient's wife and daughter at the bedside and I was called in to talk with family. Patient's wife seems distraught and reports she hasn't been happy with his care today. Pt's daughter reports that this is her first time being able to get up here in awhile and she wanted to speak to the MD to get an update. MD had already given an update to the wife earlier but she was unable to relay any of that information to daughter. Daughter works in system and had specific questions about test results, plan of care, and who was going to be consulted. Paged Dr. Joseph Art to see if he would be able to come talk to family and he is not available. Maggie, RN is talking with family and giving as much information as she can.

## 2016-11-27 DIAGNOSIS — Z7189 Other specified counseling: Secondary | ICD-10-CM

## 2016-11-27 DIAGNOSIS — Z515 Encounter for palliative care: Secondary | ICD-10-CM

## 2016-11-27 LAB — BASIC METABOLIC PANEL
Anion gap: 6 (ref 5–15)
BUN: 48 mg/dL — ABNORMAL HIGH (ref 6–20)
CALCIUM: 7.9 mg/dL — AB (ref 8.9–10.3)
CO2: 29 mmol/L (ref 22–32)
CREATININE: 1.7 mg/dL — AB (ref 0.61–1.24)
Chloride: 100 mmol/L — ABNORMAL LOW (ref 101–111)
GFR calc Af Amer: 41 mL/min — ABNORMAL LOW (ref >60)
GFR, EST NON AFRICAN AMERICAN: 35 mL/min — AB (ref >60)
GLUCOSE: 285 mg/dL — AB (ref 65–99)
Potassium: 4.3 mmol/L (ref 3.5–5.1)
Sodium: 135 mmol/L (ref 135–145)

## 2016-11-27 LAB — GLUCOSE, CAPILLARY
GLUCOSE-CAPILLARY: 242 mg/dL — AB (ref 65–99)
Glucose-Capillary: 242 mg/dL — ABNORMAL HIGH (ref 65–99)
Glucose-Capillary: 274 mg/dL — ABNORMAL HIGH (ref 65–99)

## 2016-11-27 LAB — CBC WITH DIFFERENTIAL/PLATELET
Basophils Absolute: 0 10*3/uL (ref 0.0–0.1)
Basophils Relative: 0 %
EOS ABS: 0 10*3/uL (ref 0.0–0.7)
EOS PCT: 0 %
HCT: 26.3 % — ABNORMAL LOW (ref 39.0–52.0)
HEMOGLOBIN: 8.5 g/dL — AB (ref 13.0–17.0)
LYMPHS ABS: 0.6 10*3/uL — AB (ref 0.7–4.0)
LYMPHS PCT: 5 %
MCH: 27.2 pg (ref 26.0–34.0)
MCHC: 32.3 g/dL (ref 30.0–36.0)
MCV: 84 fL (ref 78.0–100.0)
Monocytes Absolute: 0.5 10*3/uL (ref 0.1–1.0)
Monocytes Relative: 4 %
Neutro Abs: 10.8 10*3/uL — ABNORMAL HIGH (ref 1.7–7.7)
Neutrophils Relative %: 91 %
PLATELETS: 151 10*3/uL (ref 150–400)
RBC: 3.13 MIL/uL — AB (ref 4.22–5.81)
RDW: 15.2 % (ref 11.5–15.5)
WBC: 12 10*3/uL — AB (ref 4.0–10.5)

## 2016-11-27 LAB — MAGNESIUM: MAGNESIUM: 2.1 mg/dL (ref 1.7–2.4)

## 2016-11-27 MED ORDER — GABAPENTIN 100 MG PO CAPS
100.0000 mg | ORAL_CAPSULE | Freq: Three times a day (TID) | ORAL | Status: DC
  Administered 2016-11-27: 100 mg via ORAL
  Filled 2016-11-27: qty 1

## 2016-11-27 MED ORDER — VALACYCLOVIR HCL 500 MG PO TABS
1000.0000 mg | ORAL_TABLET | Freq: Three times a day (TID) | ORAL | Status: DC
  Administered 2016-11-27 – 2016-11-29 (×6): 1000 mg via ORAL
  Filled 2016-11-27 (×7): qty 2

## 2016-11-27 MED ORDER — HYDROCODONE-ACETAMINOPHEN 5-325 MG PO TABS
1.0000 | ORAL_TABLET | ORAL | Status: DC | PRN
  Administered 2016-11-27 – 2016-11-29 (×5): 1 via ORAL
  Filled 2016-11-27 (×5): qty 1

## 2016-11-27 MED ORDER — GABAPENTIN 100 MG PO CAPS
100.0000 mg | ORAL_CAPSULE | Freq: Two times a day (BID) | ORAL | Status: DC
  Administered 2016-11-27 – 2016-12-01 (×8): 100 mg via ORAL
  Filled 2016-11-27 (×8): qty 1

## 2016-11-27 NOTE — Progress Notes (Addendum)
Inpatient Diabetes Program Recommendations  AACE/ADA: New Consensus Statement on Inpatient Glycemic Control (2015)  Target Ranges:  Prepandial:   less than 140 mg/dL      Peak postprandial:   less than 180 mg/dL (1-2 hours)      Critically ill patients:  140 - 180 mg/dL   Lab Results  Component Value Date   GLUCAP 242 (H) 11/27/2016   HGBA1C 10.2 (H) 10/13/2016   Results for Devin Heath, Devin Heath (MRN [id number]) as of 11/27/2016 10:24  Ref. Range 11/26/2016 07:59 11/26/2016 11:32 11/26/2016 15:58 11/26/2016 19:19 11/26/2016 21:17 11/27/2016 08:07  Glucose-Capillary Latest Ref Range: 65 - 99 mg/dL 96 509 (H) 326 (H) 712 (H) 305 (H) 242 (H)   Review of Glycemic Control  Diabetes history: DM, steroids this admission, elderly, BUN=48, Creatinine=1.7, GFR=35, HGB=8.5 Outpatient Diabetes medications: Toujeo 40 units QHS, Apidra 14 units TIDAC, home steroid use Current orders for Inpatient glycemic control: Novolog 0-15 units TIDAC, Lantus 10 units BID  Inpatient Diabetes Program Recommendations:    Please consider meal coverage of 5 units TIDAC if patient eats > 50% of meal.    If add meal coverage, please consider decreasing correction scale to sensitive (Novolog 0-9 units TIDAC).  Thank you,  Kristine Linea, RN, MSN Diabetes Coordinator Inpatient Diabetes Program [phone] (Team Pager)

## 2016-11-27 NOTE — Consult Note (Signed)
Consultation Note Date: 11/27/2016   Patient Name: Devin Heath  DOB: 09-Feb-1929  MRN: [id number]  Age / Sex: 83 y.o., male  PCP: Josetta Huddle, MD Referring Physician: Roxan Hockey, MD  Reason for Consultation: Establishing goals of care and Pain control  HPI/Patient Profile: 83 y.o. male  with past medical history of a-fib, ischemic cardiomyopathy (EF 20% in past), CAD, DM II, CKD IV, COPD, OSA admitted on 11/21/2016 with GI bleed.   Clinical Assessment and Goals of Care: I met today with patient, his wife, and his dtr in law.    He recently relocated to [place] to be closer to family. Is former [employer]. Lives with wife.  Has been having decrease in functional status recently with increased falls.  We discussed his clinical course over this hospitalization as well as prior to hospitalization.  He and his wife agree that he has been having more difficulty getting around, however they are hopeful that he will continue to improve wit rehab.  He and his family are in agreement that a good plan would be to plan to transition to rehab as they have previously been arranging. He has done well with rehabbing in the past. If he does well and continues to thrive, I encouraged they continue with this plan. If, however he is unable to regain function and he continues to decline, I recommended that she speak with his PCP to determine if he may be better served by focusing his care on staying at home with support of organization such as hospice.   SUMMARY OF RECOMMENDATIONS   - We discussed that in light of multiple chronic medical problems that have worsened with this acute problem, care should be focused on interventions that are likely to allow the patient to achieve goal of getting back to home and spending time with family. I discussed with family regarding heroic interventions at the end-of-life and  they agree this would not be in line with prior expressed wishes for a natural death or be likely to lead to getting well enough to go back home. They were in agreement with changing CODE STATUS to DO NOT RESUSCITATE.   We reviewed a MOST form and discussed how to develop plan of care to focus on continuing therapies that would maximize chance of being well enough to return home and limiting therapies not in line with this goal.  He wants to discuss MOST form with his family prior to completing.  Today, we discussed MOST form with selections as follows: DNR, Limited additional interventions, IVF and ABX if indicated, no feeding tube. -Will follow up in a couple of days to complete MOST prior to discharge if he wants to do so.  His wife stopped me in the hall and asked me to give him a little time to process before I come back.  Code Status/Advance Care Planning:  DNR   Symptom Management:   Pain: Patient with back and shingles related pain.  Reports not improved with tramadol.  He has used hydrocodone/APAP in  the past. His most recent script was for Norco 5/325 in 06/17.  Will d/c tramadol and start hydrocodone/APAP at 5/325.  May need to increase this dose.  Palliative Prophylaxis:   Bowel Regimen and Frequent Pain Assessment  Psycho-social/Spiritual:   Desire for further Chaplaincy support:no  Prognosis:   Unable to determine  Discharge Planning: SNF for rehab      Primary Diagnoses: Present on Admission: . Lactic acidosis . GIB (gastrointestinal bleeding)   I have reviewed the medical record, interviewed the patient and family, and examined the patient. The following aspects are pertinent.  Past Medical History:  Diagnosis Date  . A-fib ([place])   . Arthritis   . CAD (coronary artery disease)   . COPD (chronic obstructive pulmonary disease) ([place])   . Diabetes ([place])   . Former smoker   . Gout   . Hard of hearing   . Heart failure ([place])   . Hyperlipidemia   . IBS  (irritable bowel syndrome)   . Ischemic cardiomyopathy   . On home oxygen therapy    "2L q hs" (10/13/2016)  . OSA on CPAP   . Renal insufficiency   . Rheumatoid arthritis ([place])   . RLS (restless legs syndrome)   . Vitamin D deficiency    Social History   Social History  . Marital status: Married    Spouse name: JEAN  . Number of children: 3  . Years of education: COLLEGE   Occupational History  . RETIRED    Social History Main Topics  . Smoking status: Former Research scientist (life sciences)  . Smokeless tobacco: Never Used  . Alcohol use No  . Drug use: No  . Sexual activity: Not Asked   Other Topics Concern  . None   Social History Narrative   Lives with with, just relocated to [place] 2 months ago per son.   Family History  Problem Relation Age of Onset  . CAD Mother   . CAD Father    Scheduled Meds: . allopurinol  100 mg Oral Daily  . carvedilol  25 mg Oral BID WC  . gabapentin  100 mg Oral BID  . insulin aspart  0-15 Units Subcutaneous TID WC  . insulin glargine  10 Units Subcutaneous BID  . mirtazapine  15 mg Oral QHS  . pantoprazole  40 mg Oral Daily  . predniSONE  15 mg Oral Q breakfast  . terazosin  2 mg Oral QHS  . tiotropium  18 mcg Inhalation Daily  . torsemide  20 mg Oral BID  . valACYclovir  1,000 mg Oral TID   Continuous Infusions: PRN Meds:.acetaminophen, albuterol, hydrALAZINE, HYDROcodone-acetaminophen, promethazine Medications Prior to Admission:  Prior to Admission medications   Medication Sig Start Date End Date Taking? Authorizing Provider  acetaminophen (TYLENOL) 500 MG tablet Take 500 mg by mouth every 6 (six) hours as needed for moderate pain.    Yes Historical Provider, MD  albuterol (PROVENTIL HFA;VENTOLIN HFA) 108 (90 Base) MCG/ACT inhaler Inhale 2 puffs into the lungs every 6 (six) hours as needed for wheezing or shortness of breath.   Yes Historical Provider, MD  allopurinol (ZYLOPRIM) 100 MG tablet Take 100 mg by mouth daily.   Yes Historical Provider, MD   apixaban (ELIQUIS) 2.5 MG TABS tablet Take 1 tablet (2.5 mg total) by mouth 2 (two) times daily. 11/09/16  Yes Larey Dresser, MD  carvedilol (COREG) 25 MG tablet Take 25 mg by mouth 2 (two) times daily with a meal.   Yes Historical  Provider, MD  cetirizine (ZYRTEC) 5 MG tablet Take 5 mg by mouth daily as needed for allergies.   Yes Historical Provider, MD  Cholecalciferol (VITAMIN D3) 1000 units CAPS Take 2,000 Units by mouth daily.    Yes Historical Provider, MD  cholestyramine (QUESTRAN) 4 g packet Take 4 g by mouth 2 (two) times daily.   Yes Historical Provider, MD  Coenzyme Q10 (CO Q-10) 100 MG CAPS Take 100 mg by mouth daily.    Yes Historical Provider, MD  diphenhydrAMINE (ALLERGY RELIEF) 25 mg capsule Take 25 mg by mouth every 6 (six) hours as needed for itching or allergies.    Yes Historical Provider, MD  Insulin Glargine (TOUJEO SOLOSTAR) 300 UNIT/ML SOPN Inject 40 Units into the skin at bedtime.   Yes Historical Provider, MD  insulin glulisine (APIDRA) 100 UNIT/ML injection Inject 14 Units into the skin 3 (three) times daily before meals.   Yes Historical Provider, MD  mirtazapine (REMERON) 15 MG tablet Take 15 mg by mouth at bedtime.   Yes Historical Provider, MD  omeprazole (PRILOSEC) 20 MG capsule Take 20 mg by mouth daily.   Yes Historical Provider, MD  potassium chloride (K-DUR,KLOR-CON) 10 MEQ tablet Take 2 tablets (20 mEq total) by mouth daily. 10/14/16  Yes Reyne Dumas, MD  pravastatin (PRAVACHOL) 20 MG tablet Take 20 mg by mouth daily.   Yes Historical Provider, MD  predniSONE (DELTASONE) 10 MG tablet Take 15 mg by mouth daily with breakfast.   Yes Historical Provider, MD  promethazine (PHENERGAN) 25 MG tablet Take 25 mg by mouth every 6 (six) hours as needed for nausea or vomiting.   Yes Historical Provider, MD  terazosin (HYTRIN) 2 MG capsule Take 2 mg by mouth at bedtime.   Yes Historical Provider, MD  tiotropium (SPIRIVA) 18 MCG inhalation capsule Place 18 mcg into  inhaler and inhale daily.   Yes Historical Provider, MD  torsemide (DEMADEX) 20 MG tablet Take 40 mg (2 tabs) in am and 20 mg (1 tab) in pm 11/09/16  Yes Larey Dresser, MD  umeclidinium bromide (INCRUSE ELLIPTA) 62.5 MCG/INH AEPB Inhale 1 puff into the lungs daily.   Yes Historical Provider, MD   Allergies  Allergen Reactions  . Ace Inhibitors Cough  . Ambrisentan Other (See Comments)  . Clindamycin/Lincomycin Other (See Comments)  . Erythromycin Other (See Comments)  . Feldene [Piroxicam] Other (See Comments)  . Keflex [Cephalexin] Other (See Comments)  . Omnicef [Cefdinir] Other (See Comments)  . Simvastatin Other (See Comments)   Review of Systems  Constitutional: Positive for activity change and fatigue.  Musculoskeletal: Positive for back pain and neck pain.  Neurological: Positive for weakness.  Psychiatric/Behavioral: Positive for sleep disturbance.    Physical Exam General: Alert, awake, in mild distress secondary to pain.   HEENT: No bruits, no goiter, no JVD Heart: Regular rate and rhythm. No murmur appreciated. Lungs: Good air movement, clear Abdomen: Soft, nontender, nondistended, positive bowel sounds.  Ext: No significant edema Neuro: Grossly intact, nonfocal. Skin: Deferred exam of shingles area at pt request  Vital Signs: BP (!) 115/56   Pulse (!) 58   Temp 97.1 F (36.2 C) (Axillary)   Resp (!) 23   Ht _0  (1.702 m)   Wt 88 kg (194 lb 0.1 oz)   SpO2 100%   BMI 30.39 kg/m  Pain Assessment: No/denies pain   Pain Score: 7    SpO2: SpO2: 100 % O2 Device:SpO2: 100 % O2 Flow Rate: .O2 Flow  Rate (L/min): 1 L/min  IO: Intake/output summary:  Intake/Output Summary (Last 24 hours) at 11/27/16 1855 Last data filed at 11/27/16 1700  Gross per 24 hour  Intake                0 ml  Output             2525 ml  Net            -2525 ml    LBM: Last BM Date: 11/25/16 Baseline Weight: Weight: 85 kg (187 lb 6.3 oz) Most recent weight: Weight: 88 kg (194  lb 0.1 oz)     Palliative Assessment/Data:   Flowsheet Rows   Flowsheet Row Most Recent Value  Intake Tab  Referral Department  Hospitalist  Unit at Time of Referral  Intermediate Care Unit  Palliative Care Primary Diagnosis  Cardiac  Date Notified  11/26/16  Palliative Care Type  New Palliative care  Reason for referral  Clarify Goals of Care  Date of Admission  11/21/16  Date first seen by Palliative Care  11/27/16  # of days Palliative referral response time  1 Day(s)  # of days IP prior to Palliative referral  5  Clinical Assessment  Palliative Performance Scale Score  50%  Pain Max last 24 hours  9  Pain Min Last 24 hours  0  Psychosocial & Spiritual Assessment  Palliative Care Outcomes  Patient/Family meeting held?  Yes  Who was at the meeting?  Patient, wife, daughter in law      Time In: 1720 Time Out: 1830 Time Total: 25 Greater than 50%  of this time was spent counseling and coordinating care related to the above assessment and plan.  Signed by: Micheline Rough, MD   Please contact Palliative Medicine Team phone at [phone] for questions and concerns.  For individual provider: See Shea Evans

## 2016-11-27 NOTE — Progress Notes (Signed)
Patient Demographics:    Devin Heath, is a 83 y.o. male, DOB - 1929/01/21, RUE:[id number]  Admit date - 11/21/2016   Admitting Physician Leslye Peer, MD  Outpatient Primary MD for the patient is [name], MD  LOS - 6   Chief Complaint  Patient presents with  . Rectal Bleeding  . Shortness of Breath  . Fall  . Weakness        Subjective:    Devin Heath today has no fevers, no emesis,  No chest pain,  Painful Lt flank rash   Assessment  & Plan :    Active Problems:   Lactic acidosis   GIB (gastrointestinal bleeding)   Melena   Acute blood loss anemia   Coronary artery disease involving coronary bypass graft of native heart without angina pectoris   Chronic anticoagulation   Atrial fibrillation with RVR (HCC)   Chronic diastolic CHF (congestive heart failure) (HCC)   Acute renal failure superimposed on stage 4 chronic kidney disease (HCC)   Uncontrolled type 2 diabetes mellitus with complication (HCC)   Type 2 diabetes mellitus with ketoacidotic coma, without long-term current use of insulin   CAD in native artery   Upper GI bleed   Neck pain   Ischemic dilated cardiomyopathy (HCC)   DISH (diffuse idiopathic skeletal hyperostosis)   1)Acute GI Bleed-   In setting of Eliquis for A fib, 11/24/16,  EGD: normal, 11/25/16 colonoscopy normal. , 11/26/16 capsule endoscopy negative. Hgb is 8.5, previously transfused 1 unit of packed red blood cells on 11/24/16, recent hemoglobin is 11. Follow H&H and watch for recurrent bleed. Currently off Eliquis  2)Atrial Fibrillation/ischemic cardiomyopathy with EF in the 20% range- CHADSVASC score was 5, was started on Eliquis on 10/31/2016 by Dr. Shirlee Latch, patient and his wife would like to stay off anticoagulation at this time, they understand increased risk of stroke. Continue Coreg 25 mg twice a day and torsemide 20mg  twice a day  3)CKD-  stage  3-4 at baseline, avoid nephrotoxic agents  4)Lt Flank Shingles- lesions noted and diagnosed on 11/27/2016, Valtrex 1 g 3 times a day for 1 week, gabapentin 100mg   Bid  for neuropathic pain (dose and frequency adjusted for renal function). No evidence of superimposed secondary cellulitis at this time  5)Acute on Chronic Anemia -at baseline patient has anemia CKD with baseline hemoglobin of 11, anemia worsened due to acute GI blood loss , patient was transfused 1 unit of packed red blood cells on 11/24/16  6)Gout- continue prednisone 15 mg daily, ?? Adrenal insufficiency  7)DM- Lantus 10 mg twice a day,Use Novolog/Humalog Sliding scale insulin with Accu-Cheks/Fingersticks as ordered   Code Status : full code  Disposition Plan  : SNF after shingles lesions crust  Consults  :  GI   DVT Prophylaxis  : SCDs   Lab Results  Component Value Date   PLT 151 11/27/2016    Inpatient Medications  Scheduled Meds: . allopurinol  100 mg Oral Daily  . carvedilol  25 mg Oral BID WC  . gabapentin  100 mg Oral TID  . insulin aspart  0-15 Units Subcutaneous TID WC  . insulin glargine  10 Units Subcutaneous BID  . mirtazapine  15 mg Oral QHS  .  pantoprazole  40 mg Oral Daily  . predniSONE  15 mg Oral Q breakfast  . terazosin  2 mg Oral QHS  . tiotropium  18 mcg Inhalation Daily  . torsemide  20 mg Oral BID  . valACYclovir  1,000 mg Oral TID   Continuous Infusions: PRN Meds:.acetaminophen, albuterol, hydrALAZINE, promethazine, traMADol    Anti-infectives    Start     Dose/Rate Route Frequency Ordered Stop   11/27/16 1515  valACYclovir (VALTREX) tablet 1,000 mg     1,000 mg Oral 3 times daily 11/27/16 1505     11/21/16 1800  levofloxacin (LEVAQUIN) IVPB 500 mg  Status:  Discontinued     500 mg 100 mL/hr over 60 Minutes Intravenous Every 48 hours 11/21/16 1735 11/21/16 2033   11/21/16 1730  vancomycin (VANCOCIN) 1,250 mg in sodium chloride 0.9 % 250 mL IVPB  Status:  Discontinued       1,250 mg 166.7 mL/hr over 90 Minutes Intravenous Every 24 hours 11/21/16 1652 11/21/16 2033        Objective:   Vitals:   11/27/16 0428 11/27/16 0751 11/27/16 1203 11/27/16 1546  BP:  (!) 118/52 117/62 (!) 115/56  Pulse:  (!) 48 90 (!) 58  Resp:  (!) 26 (!) 22 (!) 23  Temp:  97.3 F (36.3 C) 97.4 F (36.3 C)   TempSrc:  Axillary Oral   SpO2:   100% 100%  Weight: 88 kg (194 lb 0.1 oz)     Height:        Wt Readings from Last 3 Encounters:  11/27/16 88 kg (194 lb 0.1 oz)  11/09/16 85.7 kg (189 lb)  10/14/16 81.8 kg (180 lb 4.8 oz)     Intake/Output Summary (Last 24 hours) at 11/27/16 1602 Last data filed at 11/27/16 1100  Gross per 24 hour  Intake               20 ml  Output             1975 ml  Net            -1955 ml     Physical Exam  Gen:- Awake Alert,  In no apparent distress  HEENT:- [place].AT, No sclera icterus Neck-Supple Neck,No JVD,.  Lungs-  CTAB  CV- S1, S2 normal, irregular Abd-  +ve B.Sounds, Abd Soft, No tenderness,    Skin:- Left flank and left lower rib cage area with erythematous vesicular rash (fluid filled lesions) in dermatomal form distribution pattern consistent with shingles without excoriations or secondary cellulitis    Data Review:   Micro Results Recent Results (from the past 240 hour(s))  MRSA PCR Screening     Status: None   Collection Time: 11/21/16  8:32 PM  Result Value Ref Range Status   MRSA by PCR NEGATIVE NEGATIVE Final    Comment:        The GeneXpert MRSA Assay (FDA approved for NASAL specimens only), is one component of a comprehensive MRSA colonization surveillance program. It is not intended to diagnose MRSA infection nor to guide or monitor treatment for MRSA infections.   Culture, Urine     Status: Abnormal   Collection Time: 11/24/16  8:31 PM  Result Value Ref Range Status   Specimen Description URINE, CLEAN CATCH  Final   Special Requests NONE  Final   Culture <10,000 COLONIES/mL INSIGNIFICANT GROWTH  (A)  Final   Report Status 11/26/2016 FINAL  Final  Culture, blood (routine x 2)  Status: None (Preliminary result)   Collection Time: 11/24/16 10:31 PM  Result Value Ref Range Status   Specimen Description BLOOD RIGHT ANTECUBITAL  Final   Special Requests BOTTLES DRAWN AEROBIC AND ANAEROBIC 5CC  Final   Culture NO GROWTH 1 DAY  Final   Report Status PENDING  Incomplete  Culture, blood (routine x 2)     Status: None (Preliminary result)   Collection Time: 11/25/16  1:52 AM  Result Value Ref Range Status   Specimen Description BLOOD RIGHT ARM  Final   Special Requests IN PEDIATRIC BOTTLE  Final   Culture NO GROWTH 1 DAY  Final   Report Status PENDING  Incomplete  Culture, blood (Routine X 2) w Reflex to ID Panel     Status: None (Preliminary result)   Collection Time: 11/25/16  8:10 AM  Result Value Ref Range Status   Specimen Description BLOOD RIGHT HAND  Final   Special Requests IN PEDIATRIC BOTTLE 2CC  Final   Culture NO GROWTH 1 DAY  Final   Report Status PENDING  Incomplete  Culture, blood (Routine X 2) w Reflex to ID Panel     Status: None (Preliminary result)   Collection Time: 11/25/16  8:16 AM  Result Value Ref Range Status   Specimen Description BLOOD LEFT HAND  Final   Special Requests BOTTLES DRAWN AEROBIC ONLY 5CC  Final   Culture NO GROWTH 1 DAY  Final   Report Status PENDING  Incomplete    Radiology Reports Dg Cervical Spine 2 Or 3 Views  Result Date: 11/26/2016 CLINICAL DATA:  neck pain EXAM: CERVICAL SPINE - 2-3 VIEW COMPARISON:  None. FINDINGS: Normal alignment of the cervical spine. Ventral syndesmophytes are noted from C2 through C6. The disc spaces are relatively well preserved. No fracture identified. IMPRESSION: 1. Multi level ventral syndesmophytes noted which may reflect diffuse idiopathic skeletal hyperostosis (DISH) Electronically Signed   By: Signa Kell M.D.   On: 11/26/2016 15:06   Dg Thoracic Spine 2 View  Result Date: 11/26/2016 CLINICAL  DATA:  Back pain for 6 weeks. EXAM: THORACIC SPINE 2 VIEWS COMPARISON:  None. FINDINGS: Exaggerated thoracic kyphosis but normal alignment of the vertebral bodies. There are findings suggestive of DISH. No acute fracture or bone lesion. No abnormal paraspinal soft tissue swelling. Tortuosity and calcification of the thoracic aorta is noted. The visualized posterior ribs are intact. IMPRESSION: Moderate to advanced degenerative changes involving the spine with probable changes of DISH. Exaggerated thoracic kyphosis but no acute fracture. Electronically Signed   By: Rudie Meyer M.D.   On: 11/26/2016 15:13   Dg Chest Port 1 View  Result Date: 11/25/2016 CLINICAL DATA:  Leukocytosis. EXAM: PORTABLE CHEST 1 VIEW COMPARISON:  11/24/2016 FINDINGS: Prior CABG. Heart is borderline in size. Diffuse interstitial prominence throughout the lungs with peribronchial thickening. Mild hyperinflation. No confluent opacity or effusion. No acute bony abnormality. IMPRESSION: Hyperinflation/ COPD.  Chronic bronchitic changes. Electronically Signed   By: Charlett Nose M.D.   On: 11/25/2016 09:41   Dg Chest Port 1 View  Result Date: 11/24/2016 CLINICAL DATA:  Initial evaluation for acute shortness of breath. EXAM: PORTABLE CHEST 1 VIEW COMPARISON:  Prior radiograph from 11/21/2016. FINDINGS: Median sternotomy wires with underlying CABG markers and surgical clips, stable. Mild cardiomegaly is unchanged. Mediastinal silhouette within normal limits. Aortic atherosclerosis noted. Lungs normally inflated. No focal infiltrates. No pulmonary edema or pleural effusion. No pneumothorax. No acute osseous abnormality. IMPRESSION: 1. No active cardiopulmonary disease. 2. Mild cardiomegaly  with sequelae of prior CABG. Electronically Signed   By: Rise Mu M.D.   On: 11/24/2016 22:00   Dg Chest Portable 1 View  Result Date: 11/21/2016 CLINICAL DATA:  Pt with weight gain this AM of 2 lbs, pt has increased SOB. Hx CHF, COPD, CAD,  afib EXAM: PORTABLE CHEST 1 VIEW COMPARISON:  10/13/2016 FINDINGS: Status post median sternotomy and CABG. Heart size is normal. Lungs are clear. No pulmonary edema. Degenerative changes are seen in thoracic spine. IMPRESSION: No evidence for acute  abnormality. Electronically Signed   By: Norva Pavlov M.D.   On: 11/21/2016 17:03     CBC  Recent Labs Lab 11/21/16 1617  11/22/16 0432  11/23/16 2124 11/24/16 0350 11/25/16 0152 11/26/16 0600 11/27/16 0414  WBC 22.8*  < > 16.2*  --   --  19.0* 21.8* 12.5* 12.0*  HGB 10.3*  < > 9.9*  < > 9.0* 8.7* 9.7* 8.7* 8.5*  HCT 32.0*  < > 30.0*  < > 27.3* 26.2* 29.3* 27.4* 26.3*  PLT 218  < > 154  --   --  194 225 149* 151  MCV 82.1  < > 81.5  --   --  82.4 83.0 87.0 84.0  MCH 26.4  < > 26.9  --   --  27.4 27.5 27.6 27.2  MCHC 32.2  < > 33.0  --   --  33.2 33.1 31.8 32.3  RDW 15.0  < > 14.9  --   --  15.2 15.8* 15.8* 15.2  LYMPHSABS 2.5  --   --   --   --   --   --  0.9 0.6*  MONOABS 1.4*  --   --   --   --   --   --  0.9 0.5  EOSABS 0.2  --   --   --   --   --   --  0.1 0.0  BASOSABS 0.0  --   --   --   --   --   --  0.0 0.0  < > = values in this interval not displayed.  Chemistries   Recent Labs Lab 11/21/16 1617 11/23/16 0606  11/24/16 0350 11/24/16 0731 11/25/16 0152 11/26/16 0600 11/27/16 0414  NA 132* 136  < > 132* 133* 139 139 135  K 3.1* 3.4*  < > 4.1 4.2 5.1 3.2* 4.3  CL 89* 97*  < > 98* 99* 103 104 100*  CO2 21* 27  < > 25 24 24 27 29   GLUCOSE 344* 224*  < > 305* 160* 279* 97 285*  BUN 51* 53*  < > 57* 59* 54* 47* 48*  CREATININE 2.82* 2.26*  < > 2.07* 1.91* 2.05* 1.85* 1.70*  CALCIUM 8.6* 8.8*  < > 8.7* 8.7* 8.7* 8.2* 7.9*  MG  --  2.4  --   --   --  2.4 2.2 2.1  AST 28  --   --  17  --   --   --   --   ALT 17  --   --  14*  --   --   --   --   ALKPHOS 70  --   --  60  --   --   --   --   BILITOT 1.2  --   --  0.6  --   --   --   --   < > = values in this interval not  displayed. ------------------------------------------------------------------------------------------------------------------  No results for input(s): CHOL, HDL, LDLCALC, TRIG, CHOLHDL, LDLDIRECT in the last 72 hours.  Lab Results  Component Value Date   HGBA1C 10.2 (H) 10/13/2016   ------------------------------------------------------------------------------------------------------------------ No results for input(s): TSH, T4TOTAL, T3FREE, THYROIDAB in the last 72 hours.  Invalid input(s): FREET3 ------------------------------------------------------------------------------------------------------------------ No results for input(s): VITAMINB12, FOLATE, FERRITIN, TIBC, IRON, RETICCTPCT in the last 72 hours.  Coagulation profile  Recent Labs Lab 11/21/16 1617  INR 1.40    No results for input(s): DDIMER in the last 72 hours.  Cardiac Enzymes  Recent Labs Lab 11/26/16 1044  TROPONINI <0.03   ------------------------------------------------------------------------------------------------------------------    Component Value Date/Time   BNP 79.9 11/09/2016 1302     Devin Heath M.D on 11/27/2016 at 4:02 PM  Between 7am to 7pm - Pager - [phone]  After 7pm go to [email or web address] - password [email or web address]  [facility] -  Office  [phone]  Dragon dictation system was used to create this note, attempts have been made to correct errors, however presence of uncorrected errors is not a reflection quality of care provided

## 2016-11-28 DIAGNOSIS — B029 Zoster without complications: Secondary | ICD-10-CM | POA: Diagnosis present

## 2016-11-28 LAB — GLUCOSE, CAPILLARY
GLUCOSE-CAPILLARY: 220 mg/dL — AB (ref 65–99)
GLUCOSE-CAPILLARY: 344 mg/dL — AB (ref 65–99)
GLUCOSE-CAPILLARY: 394 mg/dL — AB (ref 65–99)
Glucose-Capillary: 180 mg/dL — ABNORMAL HIGH (ref 65–99)

## 2016-11-28 LAB — MAGNESIUM: Magnesium: 2 mg/dL (ref 1.7–2.4)

## 2016-11-28 MED ORDER — POLYETHYLENE GLYCOL 3350 17 G PO PACK
17.0000 g | PACK | Freq: Every day | ORAL | Status: DC
  Administered 2016-11-28 – 2016-12-01 (×4): 17 g via ORAL
  Filled 2016-11-28 (×4): qty 1

## 2016-11-28 NOTE — Progress Notes (Signed)
Blood sugar at HS was 313. Blood sugar crossed to another patient's chart by the name of Felipa Evener. Correct patient's arm band was scanned.

## 2016-11-28 NOTE — Progress Notes (Signed)
Patient Demographics:    Devin Heath, is a 83 y.o. male, DOB - [birth year], YQM:[id number]  Admit date - 11/21/2016   Admitting Physician Leslye Peer, MD  Outpatient Primary MD for the patient is Devin Dubonnet, MD  LOS - 7   Chief Complaint  Patient presents with  . Rectal Bleeding  . Shortness of Breath  . Fall  . Weakness        Subjective:    [name] today has no fevers, no emesis,  No chest pain,  Lt flank pain is not worse   Assessment  & Plan :    Active Problems:   Lactic acidosis   GIB (gastrointestinal bleeding)   Melena   Acute blood loss anemia   Coronary artery disease involving coronary bypass graft of native heart without angina pectoris   Chronic anticoagulation   Atrial fibrillation with RVR (HCC)   Chronic diastolic CHF (congestive heart failure) (HCC)   Acute renal failure superimposed on stage 4 chronic kidney disease (HCC)   Uncontrolled type 2 diabetes mellitus with complication (HCC)   Type 2 diabetes mellitus with ketoacidotic coma, without long-term current use of insulin   CAD in native artery   Upper GI bleed   Neck pain   Ischemic dilated cardiomyopathy (HCC)   DISH (diffuse idiopathic skeletal hyperostosis)  1)Acute GI Bleed-  In setting of Eliquis for A fib, 11/24/16,  EGD: normal, 11/25/16 colonoscopy normal. , 11/26/16 capsule endoscopy negative. Hgb is 8.5, previously transfused 1 unit of packed red blood cells on 11/24/16, recent hemoglobin is 11. Follow H&H and watch for recurrent bleed. Currently off Eliquis  2)Atrial Fibrillation/ischemic cardiomyopathy with EF in the 20% range- CHADSVASC score was 5, was started on Eliquis on 10/31/2016 by Dr. Shirlee Latch, patient and his wife would like to stay off anticoagulation at this time, they understand increased risk of stroke. Continue Coreg 25 mg twice a day and torsemide 20mg  twice a day  3)CKD-  stage  3-4 at baseline, avoid nephrotoxic agents  4)Lt Flank Shingles- lesions noted and diagnosed on 11/27/2016, Valtrex 1 g 3 times a day for 1 week (started 11/27/16)  gabapentin 100mg   Bid  for neuropathic pain (dose and frequency adjusted for renal function). No evidence of superimposed secondary cellulitis at this time  5)Acute on Chronic Anemia -at baseline patient has anemia CKD with baseline hemoglobin of 11, anemia worsened due to acute GI blood loss , patient was transfused 1 unit of packed red blood cells on 11/24/16  6)Gout- continue prednisone 15 mg daily, ?? Adrenal insufficiency  7)DM- Lantus 10 mg twice a day,Use Novolog/Humalog Sliding scale insulin with Accu-Cheks/Fingersticks as ordered   Code Status :  DNR  Disposition Plan  : SNF after shingles lesions crust  Consults  :  GI and Palliative  DVT Prophylaxis  : SCDs   Lab Results  Component Value Date   PLT 151 11/27/2016    Inpatient Medications  Scheduled Meds: . allopurinol  100 mg Oral Daily  . carvedilol  25 mg Oral BID WC  . gabapentin  100 mg Oral BID  . insulin aspart  0-15 Units Subcutaneous TID WC  . insulin glargine  10 Units Subcutaneous BID  . mirtazapine  15 mg  Oral QHS  . pantoprazole  40 mg Oral Daily  . predniSONE  15 mg Oral Q breakfast  . terazosin  2 mg Oral QHS  . tiotropium  18 mcg Inhalation Daily  . torsemide  20 mg Oral BID  . valACYclovir  1,000 mg Oral TID   Continuous Infusions: PRN Meds:.acetaminophen, albuterol, hydrALAZINE, HYDROcodone-acetaminophen, promethazine    Anti-infectives    Start     Dose/Rate Route Frequency Ordered Stop   11/27/16 1515  valACYclovir (VALTREX) tablet 1,000 mg     1,000 mg Oral 3 times daily 11/27/16 1505     11/21/16 1800  levofloxacin (LEVAQUIN) IVPB 500 mg  Status:  Discontinued     500 mg 100 mL/hr over 60 Minutes Intravenous Every 48 hours 11/21/16 1735 11/21/16 2033   11/21/16 1730  vancomycin (VANCOCIN) 1,250 mg in sodium  chloride 0.9 % 250 mL IVPB  Status:  Discontinued     1,250 mg 166.7 mL/hr over 90 Minutes Intravenous Every 24 hours 11/21/16 1652 11/21/16 2033        Objective:   Vitals:   11/27/16 1943 11/27/16 2344 11/28/16 0346 11/28/16 0700  BP: (!) 120/49 (!) 125/51 (!) 118/41 (!) 133/92  Pulse: (!) 47 91 95 85  Resp: (!) 28 (!) 26 20 19   Temp: 97.5 F (36.4 C) 97.6 F (36.4 C) 98.2 F (36.8 C) 97.8 F (36.6 C)  TempSrc: Oral Oral Oral Oral  SpO2: 100% 100% 100% 100%  Weight:   88 kg (194 lb 0.1 oz)   Height:        Wt Readings from Last 3 Encounters:  11/28/16 88 kg (194 lb 0.1 oz)  11/09/16 85.7 kg (189 lb)  10/14/16 81.8 kg (180 lb 4.8 oz)     Intake/Output Summary (Last 24 hours) at 11/28/16 0826 Last data filed at 11/28/16 0212  Gross per 24 hour  Intake                0 ml  Output             1425 ml  Net            -1425 ml     Physical Exam  Gen:- Awake Alert,  In no apparent distress  HEENT:- [place].AT, No sclera icterus Neck-Supple Neck,No JVD,.  Lungs-  CTAB  CV- S1, S2 normal Abd-  +ve B.Sounds, Abd Soft, No tenderness,    Extremity/Skin:- Left infracostal/flank area with erythematous vesicular rash in dermatomal distribution without excoriations or superimposed cellulitis    Data Review:   Micro Results Recent Results (from the past 240 hour(s))  MRSA PCR Screening     Status: None   Collection Time: 11/21/16  8:32 PM  Result Value Ref Range Status   MRSA by PCR NEGATIVE NEGATIVE Final    Comment:        The GeneXpert MRSA Assay (FDA approved for NASAL specimens only), is one component of a comprehensive MRSA colonization surveillance program. It is not intended to diagnose MRSA infection nor to guide or monitor treatment for MRSA infections.   Culture, Urine     Status: Abnormal   Collection Time: 11/24/16  8:31 PM  Result Value Ref Range Status   Specimen Description URINE, CLEAN CATCH  Final   Special Requests NONE  Final   Culture  <10,000 COLONIES/mL INSIGNIFICANT GROWTH (A)  Final   Report Status 11/26/2016 FINAL  Final  Culture, blood (routine x 2)     Status: None (  Preliminary result)   Collection Time: 11/24/16 10:31 PM  Result Value Ref Range Status   Specimen Description BLOOD RIGHT ANTECUBITAL  Final   Special Requests BOTTLES DRAWN AEROBIC AND ANAEROBIC 5CC  Final   Culture NO GROWTH 2 DAYS  Final   Report Status PENDING  Incomplete  Culture, blood (routine x 2)     Status: None (Preliminary result)   Collection Time: 11/25/16  1:52 AM  Result Value Ref Range Status   Specimen Description BLOOD RIGHT ARM  Final   Special Requests IN PEDIATRIC BOTTLE  Final   Culture NO GROWTH 2 DAYS  Final   Report Status PENDING  Incomplete  Culture, blood (Routine X 2) w Reflex to ID Panel     Status: None (Preliminary result)   Collection Time: 11/25/16  8:10 AM  Result Value Ref Range Status   Specimen Description BLOOD RIGHT HAND  Final   Special Requests IN PEDIATRIC BOTTLE 2CC  Final   Culture NO GROWTH 2 DAYS  Final   Report Status PENDING  Incomplete  Culture, blood (Routine X 2) w Reflex to ID Panel     Status: None (Preliminary result)   Collection Time: 11/25/16  8:16 AM  Result Value Ref Range Status   Specimen Description BLOOD LEFT HAND  Final   Special Requests BOTTLES DRAWN AEROBIC ONLY 5CC  Final   Culture NO GROWTH 2 DAYS  Final   Report Status PENDING  Incomplete    Radiology Reports Dg Cervical Spine 2 Or 3 Views  Result Date: 11/26/2016 CLINICAL DATA:  neck pain EXAM: CERVICAL SPINE - 2-3 VIEW COMPARISON:  None. FINDINGS: Normal alignment of the cervical spine. Ventral syndesmophytes are noted from C2 through C6. The disc spaces are relatively well preserved. No fracture identified. IMPRESSION: 1. Multi level ventral syndesmophytes noted which may reflect diffuse idiopathic skeletal hyperostosis (DISH) Electronically Signed   By: Signa Kell M.D.   On: 11/26/2016 15:06   Dg Thoracic  Spine 2 View  Result Date: 11/26/2016 CLINICAL DATA:  Back pain for 6 weeks. EXAM: THORACIC SPINE 2 VIEWS COMPARISON:  None. FINDINGS: Exaggerated thoracic kyphosis but normal alignment of the vertebral bodies. There are findings suggestive of DISH. No acute fracture or bone lesion. No abnormal paraspinal soft tissue swelling. Tortuosity and calcification of the thoracic aorta is noted. The visualized posterior ribs are intact. IMPRESSION: Moderate to advanced degenerative changes involving the spine with probable changes of DISH. Exaggerated thoracic kyphosis but no acute fracture. Electronically Signed   By: Rudie Meyer M.D.   On: 11/26/2016 15:13   Dg Chest Port 1 View  Result Date: 11/25/2016 CLINICAL DATA:  Leukocytosis. EXAM: PORTABLE CHEST 1 VIEW COMPARISON:  11/24/2016 FINDINGS: Prior CABG. Heart is borderline in size. Diffuse interstitial prominence throughout the lungs with peribronchial thickening. Mild hyperinflation. No confluent opacity or effusion. No acute bony abnormality. IMPRESSION: Hyperinflation/ COPD.  Chronic bronchitic changes. Electronically Signed   By: Charlett Nose M.D.   On: 11/25/2016 09:41   Dg Chest Port 1 View  Result Date: 11/24/2016 CLINICAL DATA:  Initial evaluation for acute shortness of breath. EXAM: PORTABLE CHEST 1 VIEW COMPARISON:  Prior radiograph from 11/21/2016. FINDINGS: Median sternotomy wires with underlying CABG markers and surgical clips, stable. Mild cardiomegaly is unchanged. Mediastinal silhouette within normal limits. Aortic atherosclerosis noted. Lungs normally inflated. No focal infiltrates. No pulmonary edema or pleural effusion. No pneumothorax. No acute osseous abnormality. IMPRESSION: 1. No active cardiopulmonary disease. 2. Mild cardiomegaly with sequelae  of prior CABG. Electronically Signed   By: Rise Mu M.D.   On: 11/24/2016 22:00   Dg Chest Portable 1 View  Result Date: 11/21/2016 CLINICAL DATA:  Pt with weight gain this AM of 2  lbs, pt has increased SOB. Hx CHF, COPD, CAD, afib EXAM: PORTABLE CHEST 1 VIEW COMPARISON:  10/13/2016 FINDINGS: Status post median sternotomy and CABG. Heart size is normal. Lungs are clear. No pulmonary edema. Degenerative changes are seen in thoracic spine. IMPRESSION: No evidence for acute  abnormality. Electronically Signed   By: Norva Pavlov M.D.   On: 11/21/2016 17:03     CBC  Recent Labs Lab 11/21/16 1617  11/22/16 0432  11/23/16 2124 11/24/16 0350 11/25/16 0152 11/26/16 0600 11/27/16 0414  WBC 22.8*  < > 16.2*  --   --  19.0* 21.8* 12.5* 12.0*  HGB 10.3*  < > 9.9*  < > 9.0* 8.7* 9.7* 8.7* 8.5*  HCT 32.0*  < > 30.0*  < > 27.3* 26.2* 29.3* 27.4* 26.3*  PLT 218  < > 154  --   --  194 225 149* 151  MCV 82.1  < > 81.5  --   --  82.4 83.0 87.0 84.0  MCH 26.4  < > 26.9  --   --  27.4 27.5 27.6 27.2  MCHC 32.2  < > 33.0  --   --  33.2 33.1 31.8 32.3  RDW 15.0  < > 14.9  --   --  15.2 15.8* 15.8* 15.2  LYMPHSABS 2.5  --   --   --   --   --   --  0.9 0.6*  MONOABS 1.4*  --   --   --   --   --   --  0.9 0.5  EOSABS 0.2  --   --   --   --   --   --  0.1 0.0  BASOSABS 0.0  --   --   --   --   --   --  0.0 0.0  < > = values in this interval not displayed.  Chemistries   Recent Labs Lab 11/21/16 1617 11/23/16 0606  11/24/16 0350 11/24/16 0731 11/25/16 0152 11/26/16 0600 11/27/16 0414 11/28/16 0534  NA 132* 136  < > 132* 133* 139 139 135  --   K 3.1* 3.4*  < > 4.1 4.2 5.1 3.2* 4.3  --   CL 89* 97*  < > 98* 99* 103 104 100*  --   CO2 21* 27  < > 25 24 24 27 29   --   GLUCOSE 344* 224*  < > 305* 160* 279* 97 285*  --   BUN 51* 53*  < > 57* 59* 54* 47* 48*  --   CREATININE 2.82* 2.26*  < > 2.07* 1.91* 2.05* 1.85* 1.70*  --   CALCIUM 8.6* 8.8*  < > 8.7* 8.7* 8.7* 8.2* 7.9*  --   MG  --  2.4  --   --   --  2.4 2.2 2.1 2.0  AST 28  --   --  17  --   --   --   --   --   ALT 17  --   --  14*  --   --   --   --   --   ALKPHOS 70  --   --  60  --   --   --   --   --  BILITOT  1.2  --   --  0.6  --   --   --   --   --   < > = values in this interval not displayed. ------------------------------------------------------------------------------------------------------------------ No results for input(s): CHOL, HDL, LDLCALC, TRIG, CHOLHDL, LDLDIRECT in the last 72 hours.  Lab Results  Component Value Date   HGBA1C 10.2 (H) 10/13/2016   ------------------------------------------------------------------------------------------------------------------ No results for input(s): TSH, T4TOTAL, T3FREE, THYROIDAB in the last 72 hours.  Invalid input(s): FREET3 ------------------------------------------------------------------------------------------------------------------ No results for input(s): VITAMINB12, FOLATE, FERRITIN, TIBC, IRON, RETICCTPCT in the last 72 hours.  Coagulation profile  Recent Labs Lab 11/21/16 1617  INR 1.40    No results for input(s): DDIMER in the last 72 hours.  Cardiac Enzymes  Recent Labs Lab 11/26/16 1044  TROPONINI <0.03   ------------------------------------------------------------------------------------------------------------------    Component Value Date/Time   BNP 79.9 11/09/2016 1302     Daylin Gruszka M.D on 11/28/2016 at 8:26 AM  Between 7am to 7pm - Pager - [phone]  After 7pm go to [email or web address] - password [email or web address]  [facility] -  Office  [phone]  Dragon dictation system was used to create this note, attempts have been made to correct errors, however presence of uncorrected errors is not a reflection quality of care provided

## 2016-11-29 LAB — BASIC METABOLIC PANEL
Anion gap: 9 (ref 5–15)
BUN: 42 mg/dL — AB (ref 6–20)
CALCIUM: 7.9 mg/dL — AB (ref 8.9–10.3)
CHLORIDE: 98 mmol/L — AB (ref 101–111)
CO2: 30 mmol/L (ref 22–32)
CREATININE: 1.68 mg/dL — AB (ref 0.61–1.24)
GFR calc Af Amer: 41 mL/min — ABNORMAL LOW (ref >60)
GFR calc non Af Amer: 35 mL/min — ABNORMAL LOW (ref >60)
Glucose, Bld: 239 mg/dL — ABNORMAL HIGH (ref 65–99)
Potassium: 3.5 mmol/L (ref 3.5–5.1)
SODIUM: 137 mmol/L (ref 135–145)

## 2016-11-29 LAB — CBC
HCT: 27.3 % — ABNORMAL LOW (ref 39.0–52.0)
HEMOGLOBIN: 8.8 g/dL — AB (ref 13.0–17.0)
MCH: 27.1 pg (ref 26.0–34.0)
MCHC: 32.2 g/dL (ref 30.0–36.0)
MCV: 84 fL (ref 78.0–100.0)
Platelets: 163 10*3/uL (ref 150–400)
RBC: 3.25 MIL/uL — ABNORMAL LOW (ref 4.22–5.81)
RDW: 15.2 % (ref 11.5–15.5)
WBC: 10.8 10*3/uL — ABNORMAL HIGH (ref 4.0–10.5)

## 2016-11-29 LAB — GLUCOSE, CAPILLARY
GLUCOSE-CAPILLARY: 170 mg/dL — AB (ref 65–99)
GLUCOSE-CAPILLARY: 328 mg/dL — AB (ref 65–99)
Glucose-Capillary: 212 mg/dL — ABNORMAL HIGH (ref 65–99)
Glucose-Capillary: 305 mg/dL — ABNORMAL HIGH (ref 65–99)

## 2016-11-29 LAB — MAGNESIUM: MAGNESIUM: 1.9 mg/dL (ref 1.7–2.4)

## 2016-11-29 MED ORDER — MIRTAZAPINE 7.5 MG PO TABS
7.5000 mg | ORAL_TABLET | Freq: Every day | ORAL | Status: DC
  Administered 2016-11-29 – 2016-11-30 (×2): 7.5 mg via ORAL
  Filled 2016-11-29 (×2): qty 1

## 2016-11-29 MED ORDER — VALACYCLOVIR HCL 500 MG PO TABS
1000.0000 mg | ORAL_TABLET | Freq: Two times a day (BID) | ORAL | Status: DC
Start: 2016-11-29 — End: 2016-12-01
  Administered 2016-11-29 – 2016-12-01 (×4): 1000 mg via ORAL
  Filled 2016-11-29 (×4): qty 2

## 2016-11-29 MED ORDER — HYDROCODONE-ACETAMINOPHEN 5-325 MG PO TABS
1.0000 | ORAL_TABLET | Freq: Three times a day (TID) | ORAL | Status: DC | PRN
  Administered 2016-11-29 – 2016-11-30 (×2): 1 via ORAL
  Filled 2016-11-29 (×2): qty 1

## 2016-11-29 MED ORDER — TORSEMIDE 20 MG PO TABS
20.0000 mg | ORAL_TABLET | Freq: Every day | ORAL | Status: DC
  Administered 2016-11-30 – 2016-12-01 (×2): 20 mg via ORAL
  Filled 2016-11-29 (×2): qty 1

## 2016-11-29 NOTE — Progress Notes (Signed)
Patient Demographics:    Devin Heath, is a 83 y.o. male, DOB - 15-Jan-1929, ZOX:[id number]  Admit date - 11/21/2016   Admitting Physician Leslye Peer, MD  Outpatient Primary MD for the patient is [name], MD  LOS - 8   Chief Complaint  Patient presents with  . Rectal Bleeding  . Shortness of Breath  . Fall  . Weakness        Subjective:    Devin Heath today has no fevers, no emesis,  No chest pain,  Daughter And wife at bedside, questions answered, patient was sleepy, received hydrocodone around 6 AM   Assessment  & Plan :    Active Problems:   Lactic acidosis   GIB (gastrointestinal bleeding)   Melena   Acute blood loss anemia   Coronary artery disease involving coronary bypass graft of native heart without angina pectoris   Chronic anticoagulation   Atrial fibrillation with RVR (HCC)   Chronic diastolic CHF (congestive heart failure) (HCC)   Acute renal failure superimposed on stage 4 chronic kidney disease (HCC)   Uncontrolled type 2 diabetes mellitus with complication (HCC)   Type 2 diabetes mellitus with ketoacidotic coma, without long-term current use of insulin   CAD in native artery   Upper GI bleed   Neck pain   Ischemic dilated cardiomyopathy (HCC)   DISH (diffuse idiopathic skeletal hyperostosis)   Shingles (Lt Flank)  1)Acute GI Bleed- In setting of Eliquis use for A fib, 11/24/16, EGD: normal, 11/25/16 colonoscopy normal. , 11/26/16 capsule endoscopy negative. Hgbis 8.5, previously transfused1 unit of packed red blood cells on 11/24/16, recent hemoglobin is 11. Follow H&H and watch for recurrent bleed. Currently off Eliquis, patient and family members do not want to go back on any type of FULL anticoagulation for his A. fib  2)Atrial Fibrillation/ischemic cardiomyopathy with EF in the 20% range- CHADSVASCscore was 5, was started on Eliquis on 10/31/2016  by Dr. Shirlee Latch, patient and his wife would like to stay off anticoagulation at this time, they understand increased risk of stroke. Continue Coreg 25 mg twice a day, change torsemide to 20mg  daily, patient is at risk for dehydration due to poor oral intake. Currently off Eliquis, patient and family members do not want to go back on any type of FULL anticoagulation for his A. fib  3)CKD- stage 3-4 at baseline, avoid nephrotoxic agents, change torsemide to 20mg  daily, patient is at risk for dehydration due to poor oral intake.  4)Lt Flank Shingles- lesions noted and diagnosed on 11/27/2016, Valtrex 1 g bid  a day for 1 week (started 11/27/16.... Adjusted for renal function)  And  gabapentin 100mg  Bid for neuropathic pain (dose and frequency adjusted for renal function). No evidence of superimposed secondary cellulitis at this time, change hydrocodone to 1 tablet every 8 hours to avoid excessive sedation  5)Acute on Chronic Anemia -at baseline patient has anemia CKDwith baseline hemoglobin of 11, anemia worsened due to acute GI blood loss , patient was transfused 1 unit of packed red blood cells on 11/24/16  6)Gout- continue prednisone 15mg  daily, ?? Adrenal insufficiency  7)DM- Lantus 10 mg twice a day,Use Novolog/Humalog Sliding scale insulin with Accu-Cheks/Fingersticks as ordered  8)Leukocytosis- may be related to prednisone use, no  fevers or other evidence of active infection,  Code Status: DNR  Disposition Plan:SNF after shingles lesions crust  Consults :GI and Palliative  DVT Prophylaxis: SCDs  Lab Results  Component Value Date   PLT 163 11/29/2016    Inpatient Medications  Scheduled Meds: . allopurinol  100 mg Oral Daily  . carvedilol  25 mg Oral BID WC  . gabapentin  100 mg Oral BID  . insulin aspart  0-15 Units Subcutaneous TID WC  . insulin glargine  10 Units Subcutaneous BID  . mirtazapine  15 mg Oral QHS  . pantoprazole  40 mg Oral Daily  .  polyethylene glycol  17 g Oral Daily  . predniSONE  15 mg Oral Q breakfast  . terazosin  2 mg Oral QHS  . tiotropium  18 mcg Inhalation Daily  . torsemide  20 mg Oral BID  . valACYclovir  1,000 mg Oral TID   Continuous Infusions: PRN Meds:.acetaminophen, albuterol, hydrALAZINE, HYDROcodone-acetaminophen, promethazine    Anti-infectives    Start     Dose/Rate Route Frequency Ordered Stop   11/27/16 1515  valACYclovir (VALTREX) tablet 1,000 mg     1,000 mg Oral 3 times daily 11/27/16 1505     11/21/16 1800  levofloxacin (LEVAQUIN) IVPB 500 mg  Status:  Discontinued     500 mg 100 mL/hr over 60 Minutes Intravenous Every 48 hours 11/21/16 1735 11/21/16 2033   11/21/16 1730  vancomycin (VANCOCIN) 1,250 mg in sodium chloride 0.9 % 250 mL IVPB  Status:  Discontinued     1,250 mg 166.7 mL/hr over 90 Minutes Intravenous Every 24 hours 11/21/16 1652 11/21/16 2033        Objective:   Vitals:   11/28/16 1906 11/29/16 0031 11/29/16 0317 11/29/16 0800  BP: (!) 146/51 136/68 123/61 (!) 114/53  Pulse: 100 (!) 105 95 (!) 53  Resp: (!) 25 20 14 17   Temp: 98.1 F (36.7 C) 98.3 F (36.8 C) 98 F (36.7 C) 97.3 F (36.3 C)  TempSrc: Oral Oral Oral Oral  SpO2: 100% 100% 100% 100%  Weight:   88.6 kg (195 lb 5.2 oz)   Height:        Wt Readings from Last 3 Encounters:  11/29/16 88.6 kg (195 lb 5.2 oz)  11/09/16 85.7 kg (189 lb)  10/14/16 81.8 kg (180 lb 4.8 oz)     Intake/Output Summary (Last 24 hours) at 11/29/16 0902 Last data filed at 11/29/16 0600  Gross per 24 hour  Intake              240 ml  Output             1275 ml  Net            -1035 ml     Physical Exam  Gen:- somewhat sleepy after hydrocodone HEENT:- [place].AT, No sclera icterus Neck-Supple Neck,No JVD,.  Lungs-  CTAB  CV- S1, S2 normal, irregular Abd-  +ve B.Sounds, Abd Soft, No tenderness,    Extremity/Skin:- Left infracostal/flank area with erythematous vesicular lesions consistent with shingles, in dermatomal  distribution,  eruptions have not crusted it    Data Review:   Micro Results Recent Results (from the past 240 hour(s))  MRSA PCR Screening     Status: None   Collection Time: 11/21/16  8:32 PM  Result Value Ref Range Status   MRSA by PCR NEGATIVE NEGATIVE Final    Comment:        The GeneXpert MRSA Assay (FDA  approved for NASAL specimens only), is one component of a comprehensive MRSA colonization surveillance program. It is not intended to diagnose MRSA infection nor to guide or monitor treatment for MRSA infections.   Culture, Urine     Status: Abnormal   Collection Time: 11/24/16  8:31 PM  Result Value Ref Range Status   Specimen Description URINE, CLEAN CATCH  Final   Special Requests NONE  Final   Culture <10,000 COLONIES/mL INSIGNIFICANT GROWTH (A)  Final   Report Status 11/26/2016 FINAL  Final  Culture, blood (routine x 2)     Status: None (Preliminary result)   Collection Time: 11/24/16 10:31 PM  Result Value Ref Range Status   Specimen Description BLOOD RIGHT ANTECUBITAL  Final   Special Requests BOTTLES DRAWN AEROBIC AND ANAEROBIC 5CC  Final   Culture NO GROWTH 3 DAYS  Final   Report Status PENDING  Incomplete  Culture, blood (routine x 2)     Status: None (Preliminary result)   Collection Time: 11/25/16  1:52 AM  Result Value Ref Range Status   Specimen Description BLOOD RIGHT ARM  Final   Special Requests IN PEDIATRIC BOTTLE  Final   Culture NO GROWTH 3 DAYS  Final   Report Status PENDING  Incomplete  Culture, blood (Routine X 2) w Reflex to ID Panel     Status: None (Preliminary result)   Collection Time: 11/25/16  8:10 AM  Result Value Ref Range Status   Specimen Description BLOOD RIGHT HAND  Final   Special Requests IN PEDIATRIC BOTTLE 2CC  Final   Culture NO GROWTH 3 DAYS  Final   Report Status PENDING  Incomplete  Culture, blood (Routine X 2) w Reflex to ID Panel     Status: None (Preliminary result)   Collection Time: 11/25/16  8:16 AM    Result Value Ref Range Status   Specimen Description BLOOD LEFT HAND  Final   Special Requests BOTTLES DRAWN AEROBIC ONLY 5CC  Final   Culture NO GROWTH 3 DAYS  Final   Report Status PENDING  Incomplete    Radiology Reports Dg Cervical Spine 2 Or 3 Views  Result Date: 11/26/2016 CLINICAL DATA:  neck pain EXAM: CERVICAL SPINE - 2-3 VIEW COMPARISON:  None. FINDINGS: Normal alignment of the cervical spine. Ventral syndesmophytes are noted from C2 through C6. The disc spaces are relatively well preserved. No fracture identified. IMPRESSION: 1. Multi level ventral syndesmophytes noted which may reflect diffuse idiopathic skeletal hyperostosis (DISH) Electronically Signed   By: Signa Kell M.D.   On: 11/26/2016 15:06   Dg Thoracic Spine 2 View  Result Date: 11/26/2016 CLINICAL DATA:  Back pain for 6 weeks. EXAM: THORACIC SPINE 2 VIEWS COMPARISON:  None. FINDINGS: Exaggerated thoracic kyphosis but normal alignment of the vertebral bodies. There are findings suggestive of DISH. No acute fracture or bone lesion. No abnormal paraspinal soft tissue swelling. Tortuosity and calcification of the thoracic aorta is noted. The visualized posterior ribs are intact. IMPRESSION: Moderate to advanced degenerative changes involving the spine with probable changes of DISH. Exaggerated thoracic kyphosis but no acute fracture. Electronically Signed   By: Rudie Meyer M.D.   On: 11/26/2016 15:13   Dg Chest Port 1 View  Result Date: 11/25/2016 CLINICAL DATA:  Leukocytosis. EXAM: PORTABLE CHEST 1 VIEW COMPARISON:  11/24/2016 FINDINGS: Prior CABG. Heart is borderline in size. Diffuse interstitial prominence throughout the lungs with peribronchial thickening. Mild hyperinflation. No confluent opacity or effusion. No acute bony abnormality. IMPRESSION: Hyperinflation/ COPD.  Chronic bronchitic changes. Electronically Signed   By: Charlett Nose M.D.   On: 11/25/2016 09:41   Dg Chest Port 1 View  Result Date:  11/24/2016 CLINICAL DATA:  Initial evaluation for acute shortness of breath. EXAM: PORTABLE CHEST 1 VIEW COMPARISON:  Prior radiograph from 11/21/2016. FINDINGS: Median sternotomy wires with underlying CABG markers and surgical clips, stable. Mild cardiomegaly is unchanged. Mediastinal silhouette within normal limits. Aortic atherosclerosis noted. Lungs normally inflated. No focal infiltrates. No pulmonary edema or pleural effusion. No pneumothorax. No acute osseous abnormality. IMPRESSION: 1. No active cardiopulmonary disease. 2. Mild cardiomegaly with sequelae of prior CABG. Electronically Signed   By: Rise Mu M.D.   On: 11/24/2016 22:00   Dg Chest Portable 1 View  Result Date: 11/21/2016 CLINICAL DATA:  Pt with weight gain this AM of 2 lbs, pt has increased SOB. Hx CHF, COPD, CAD, afib EXAM: PORTABLE CHEST 1 VIEW COMPARISON:  10/13/2016 FINDINGS: Status post median sternotomy and CABG. Heart size is normal. Lungs are clear. No pulmonary edema. Degenerative changes are seen in thoracic spine. IMPRESSION: No evidence for acute  abnormality. Electronically Signed   By: Norva Pavlov M.D.   On: 11/21/2016 17:03     CBC  Recent Labs Lab 11/24/16 0350 11/25/16 0152 11/26/16 0600 11/27/16 0414 11/29/16 0607  WBC 19.0* 21.8* 12.5* 12.0* 10.8*  HGB 8.7* 9.7* 8.7* 8.5* 8.8*  HCT 26.2* 29.3* 27.4* 26.3* 27.3*  PLT 194 225 149* 151 163  MCV 82.4 83.0 87.0 84.0 84.0  MCH 27.4 27.5 27.6 27.2 27.1  MCHC 33.2 33.1 31.8 32.3 32.2  RDW 15.2 15.8* 15.8* 15.2 15.2  LYMPHSABS  --   --  0.9 0.6*  --   MONOABS  --   --  0.9 0.5  --   EOSABS  --   --  0.1 0.0  --   BASOSABS  --   --  0.0 0.0  --     Chemistries   Recent Labs Lab 11/24/16 0350 11/24/16 0731 11/25/16 0152 11/26/16 0600 11/27/16 0414 11/28/16 0534 11/29/16 0607  NA 132* 133* 139 139 135  --  137  K 4.1 4.2 5.1 3.2* 4.3  --  3.5  CL 98* 99* 103 104 100*  --  98*  CO2 25 24 24 27 29   --  30  GLUCOSE 305* 160* 279*  97 285*  --  239*  BUN 57* 59* 54* 47* 48*  --  42*  CREATININE 2.07* 1.91* 2.05* 1.85* 1.70*  --  1.68*  CALCIUM 8.7* 8.7* 8.7* 8.2* 7.9*  --  7.9*  MG  --   --  2.4 2.2 2.1 2.0 1.9  AST 17  --   --   --   --   --   --   ALT 14*  --   --   --   --   --   --   ALKPHOS 60  --   --   --   --   --   --   BILITOT 0.6  --   --   --   --   --   --    ------------------------------------------------------------------------------------------------------------------ No results for input(s): CHOL, HDL, LDLCALC, TRIG, CHOLHDL, LDLDIRECT in the last 72 hours.  Lab Results  Component Value Date   HGBA1C 10.2 (H) 10/13/2016   ------------------------------------------------------------------------------------------------------------------ No results for input(s): TSH, T4TOTAL, T3FREE, THYROIDAB in the last 72 hours.  Invalid input(s): FREET3 ------------------------------------------------------------------------------------------------------------------ No results for input(s): VITAMINB12, FOLATE, FERRITIN,  TIBC, IRON, RETICCTPCT in the last 72 hours.  Coagulation profile No results for input(s): INR, PROTIME in the last 168 hours.  No results for input(s): DDIMER in the last 72 hours.  Cardiac Enzymes  Recent Labs Lab 11/26/16 1044  TROPONINI <0.03   ------------------------------------------------------------------------------------------------------------------    Component Value Date/Time   BNP 79.9 11/09/2016 1302     Kaileia Flow M.D on 11/29/2016 at 9:02 AM  Between 7am to 7pm - Pager - [phone]  After 7pm go to [email or web address] - password [email or web address]  [facility] -  Office  [phone]  Dragon dictation system was used to create this note, attempts have been made to correct errors, however presence of uncorrected errors is not a reflection quality of care provided

## 2016-11-29 NOTE — Progress Notes (Signed)
Pt refuses cpap at this time.  Rt will continue to monitor. 

## 2016-11-30 DIAGNOSIS — B029 Zoster without complications: Secondary | ICD-10-CM

## 2016-11-30 LAB — CULTURE, BLOOD (ROUTINE X 2)
CULTURE: NO GROWTH
Culture: NO GROWTH
Culture: NO GROWTH
Culture: NO GROWTH

## 2016-11-30 LAB — BASIC METABOLIC PANEL
ANION GAP: 9 (ref 5–15)
BUN: 34 mg/dL — ABNORMAL HIGH (ref 6–20)
CO2: 31 mmol/L (ref 22–32)
Calcium: 8 mg/dL — ABNORMAL LOW (ref 8.9–10.3)
Chloride: 98 mmol/L — ABNORMAL LOW (ref 101–111)
Creatinine, Ser: 1.46 mg/dL — ABNORMAL HIGH (ref 0.61–1.24)
GFR calc Af Amer: 49 mL/min — ABNORMAL LOW (ref >60)
GFR, EST NON AFRICAN AMERICAN: 42 mL/min — AB (ref >60)
Glucose, Bld: 236 mg/dL — ABNORMAL HIGH (ref 65–99)
POTASSIUM: 3.6 mmol/L (ref 3.5–5.1)
SODIUM: 138 mmol/L (ref 135–145)

## 2016-11-30 LAB — GLUCOSE, CAPILLARY
GLUCOSE-CAPILLARY: 210 mg/dL — AB (ref 65–99)
GLUCOSE-CAPILLARY: 357 mg/dL — AB (ref 65–99)
Glucose-Capillary: 290 mg/dL — ABNORMAL HIGH (ref 65–99)
Glucose-Capillary: 214 mg/dL — ABNORMAL HIGH (ref 65–99)

## 2016-11-30 LAB — CBC
HCT: 28 % — ABNORMAL LOW (ref 39.0–52.0)
Hemoglobin: 9 g/dL — ABNORMAL LOW (ref 13.0–17.0)
MCH: 27 pg (ref 26.0–34.0)
MCHC: 32.1 g/dL (ref 30.0–36.0)
MCV: 84.1 fL (ref 78.0–100.0)
PLATELETS: 180 10*3/uL (ref 150–400)
RBC: 3.33 MIL/uL — AB (ref 4.22–5.81)
RDW: 15.2 % (ref 11.5–15.5)
WBC: 11.8 10*3/uL — AB (ref 4.0–10.5)

## 2016-11-30 NOTE — Progress Notes (Addendum)
Inpatient Diabetes Program Recommendations  AACE/ADA: New Consensus Statement on Inpatient Glycemic Control (2015)  Target Ranges:  Prepandial:   less than 140 mg/dL      Peak postprandial:   less than 180 mg/dL (1-2 hours)      Critically ill patients:  140 - 180 mg/dL   Lab Results  Component Value Date   GLUCAP 210 (H) 11/30/2016   HGBA1C 10.2 (H) 10/13/2016   Results for ASH, MCELWAIN (MRN [id number]) as of 11/30/2016 10:55  Ref. Range 11/29/2016 07:58 11/29/2016 11:22 11/29/2016 16:49 11/29/2016 21:16 11/30/2016 07:44  Glucose-Capillary Latest Ref Range: 65 - 99 mg/dL 381 (H) 829 (H) 937 (H) 305 (H) 210 (H)  Results for JAKIE, DEBOW (MRN [id number]) as of 11/30/2016 14:50  Ref. Range 11/30/2016 11:19  Glucose-Capillary Latest Ref Range: 65 - 99 mg/dL 101 (H)    Review of Glycemic Control  Diabetes history: DM, steroids this admission, elderly, BUN=48, Creatinine=1.7, GFR=35, HGB=8.5 Outpatient Diabetes medications: Toujeo 40 units QHS, Apidra 14 units TIDAC, home steroid use Current orders for Inpatient glycemic control: Novolog 0-15 units TIDAC, Lantus 10 units BID  Inpatient Diabetes Program Recommendations:    Please consider meal coverage of Novolog 4 units TIDAC if patient eats > 50% of meal.    If add meal coverage, please consider decreasing correction scale to sensitive (Novolog 0-9 units TIDAC).  Text page sent to MD  Thank you,  Kristine Linea, RN, MSN Diabetes Coordinator Inpatient Diabetes Program [phone] (Team Pager)

## 2016-11-30 NOTE — Clinical Social Work Note (Signed)
Patient transferred from 3S to 6E. Handoff information provided to 6E CSW.  This CSW signing off.  Charlynn Court, CSW [phone]

## 2016-11-30 NOTE — Progress Notes (Signed)
New Tsfr Note:    Arrival Method: Bed from 3S Mental Orientation: Alert and Oriented x 4 Assessment: Done Skin: WNL IV: LFArm Pain: 0/10 Safety Measures: Bedrails up [place]bell in reach 6E Orientation: Yes Family:  No  Orders have been reviewed and implemented.  Will continue to monitor the patient.

## 2016-11-30 NOTE — Progress Notes (Signed)
,                                                                                   Patient Demographics:    Devin Heath, is a 83 y.o. male, DOB - [birth year], UYE:[id number]  Admit date - 11/21/2016   Admitting Physician Leslye Peer, MD  Outpatient Primary MD for the patient is [name], MD  LOS - 9   Chief Complaint  Patient presents with  . Rectal Bleeding  . Shortness of Breath  . Fall  . Weakness        Subjective:    Devin Heath today has no fevers, no emesis,  No chest pain,  Wife at bedside, questions answered, appetite is not great   Assessment  & Plan :    Active Problems:   Lactic acidosis   GIB (gastrointestinal bleeding)   Melena   Acute blood loss anemia   Coronary artery disease involving coronary bypass graft of native heart without angina pectoris   Chronic anticoagulation   Atrial fibrillation with RVR (HCC)   Chronic diastolic CHF (congestive heart failure) (HCC)   Acute renal failure superimposed on stage 4 chronic kidney disease (HCC)   Uncontrolled type 2 diabetes mellitus with complication (HCC)   Type 2 diabetes mellitus with ketoacidotic coma, without long-term current use of insulin   CAD in native artery   Upper GI bleed   Neck pain   Ischemic dilated cardiomyopathy (HCC)   DISH (diffuse idiopathic skeletal hyperostosis)   Shingles (Lt Flank)  Interval history:- 83 year old male w/ sig h/o AFib, ICM w/ EF 20%, stage IV CKD, COPD and OSA. F/b [employer]aeur Cards. Retired [employer] Recently re-located from [place] to be closer to family due to worsening physical decline over the last year. Was just started (10/31/16) on Eliquis (w/ CHADVASC 5). Per pt and family pt had been feeling weaker for the last 2 d PTA. On day of admit pt could not get OOB. Was incont. On arrival to ER had large melanic stool. HR 180s AF. Lactic acid was 8.6, initial Hgb 10.3 (baseline ~11).  He was treated w/ IV dilt, Typed,screened and  transfused 1 u PRBC. PCCM asked to admit  on 11/21/2016 with GI bleed. While Awaiting discharge to skilled nursing facility for rehabilitation, found to have left flank left infracostal area shingles, he is now waiting for the lesions to crust prior to being discharged to rehabilitation facility   Plan:-  1)Acute GI Bleed- In setting of Eliquis use for A fib, 11/24/16, EGD: normal, 11/25/16 colonoscopy normal. , 11/26/16 capsule endoscopy negative. Hgbis 8.5, previously transfused1 unit of packed red blood cells on 11/24/16, recent hemoglobin is 11. Follow H&H and watch for recurrent bleed. Currently off Eliquis, patient and family members do not want to go back or any type of FULL anticoagulation for his A. Fib. Hemoglobin on 11/30/2016 is 9.0  2)Atrial Fibrillation/ischemic cardiomyopathy with EF in the 20% range- CHADSVASCscore was 5, was started on Eliquis on 10/31/2016 by Dr. Shirlee Latch, patient and his wife would like to stay off anticoagulation at this time, they understand increased risk of stroke. Continue Coreg 25 mg twice  a day, change torsemide to 20mg  daily, patient is at risk for dehydration due to poor oral intake. Currently off Eliquis, patient and family members do not want to go back on any type of FULL anticoagulation for his A. fib  3)CKD- stage 3-4 at baseline, avoid nephrotoxic agents, change torsemide to 20mg  daily, patient is at risk for dehydration due to poor oral intake, creatinine is down to 1.4  4)Lt Flank Shingles- lesions noted and diagnosed on 11/27/2016, Valtrex 1 g bid  a day for 1 week (started 11/27/16.... Adjusted for renal function)  And gabapentin 100mg  Bid for neuropathic pain (dose and frequency adjusted for renal function). No evidence of superimposed secondary cellulitis at this time, change hydrocodone to 1 tablet every 8 hours to avoid excessive sedation  5)Acute on Chronic Anemia -at baseline patient has anemia CKDwith baseline hemoglobin of 11, anemia  worsened due to acute GI blood loss , patient was transfused 1 unit of packed red blood cells on 11/24/16. Hemoglobin on 11/30/2016 is 9.0  6)Gout- continue prednisone 15mg  daily, ?? Adrenal insufficiency  7)DM- Lantus 10 mg twice a day,Use Novolog/Humalog Sliding scale insulin with Accu-Cheks/Fingersticks as ordered, diabetic practitioner consult note appreciated, however at this time will avoid scheduled NovoLog with meals and use just sliding scale as patient's oral intake is erratic. Allow some permissive Hyperglycemia rather than risk life-threatening hypoglycemia in a patient with unreliable oral intake. Use Novolog Sliding scale insulin with Accu-Cheks/Fingersticks as ordered  8)Leukocytosis- may be related to prednisone use, no fevers or other evidence of active infection,  9)Discharge Plan- possible discharge to skilled nursing facility for rehabilitation after his shingles lesions have crusted, may discharge sooner if rehabilitation facility has ability to isolate him per protocol  Code Status:DNR  Disposition Plan:SNF after shingles lesions crust  Consults :GI and Palliative  DVT Prophylaxis: SCDs  Lab Results  Component Value Date   PLT 180 11/30/2016    Inpatient Medications  Scheduled Meds: . allopurinol  100 mg Oral Daily  . carvedilol  25 mg Oral BID WC  . gabapentin  100 mg Oral BID  . insulin aspart  0-15 Units Subcutaneous TID WC  . insulin glargine  10 Units Subcutaneous BID  . mirtazapine  7.5 mg Oral QHS  . pantoprazole  40 mg Oral Daily  . polyethylene glycol  17 g Oral Daily  . predniSONE  15 mg Oral Q breakfast  . terazosin  2 mg Oral QHS  . tiotropium  18 mcg Inhalation Daily  . torsemide  20 mg Oral Daily  . valACYclovir  1,000 mg Oral BID   Continuous Infusions: PRN Meds:.acetaminophen, albuterol, hydrALAZINE, HYDROcodone-acetaminophen, promethazine    Anti-infectives    Start     Dose/Rate Route Frequency Ordered Stop    11/29/16 2200  valACYclovir (VALTREX) tablet 1,000 mg     1,000 mg Oral 2 times daily 11/29/16 1519     11/27/16 1515  valACYclovir (VALTREX) tablet 1,000 mg  Status:  Discontinued     1,000 mg Oral 3 times daily 11/27/16 1505 11/29/16 1519   11/21/16 1800  levofloxacin (LEVAQUIN) IVPB 500 mg  Status:  Discontinued     500 mg 100 mL/hr over 60 Minutes Intravenous Every 48 hours 11/21/16 1735 11/21/16 2033   11/21/16 1730  vancomycin (VANCOCIN) 1,250 mg in sodium chloride 0.9 % 250 mL IVPB  Status:  Discontinued     1,250 mg 166.7 mL/hr over 90 Minutes Intravenous Every 24 hours 11/21/16 1652 11/21/16 2033  Objective:   Vitals:   11/30/16 0736 11/30/16 0743 11/30/16 1122 11/30/16 1541  BP:  128/62 (!) 127/99 115/61  Pulse:  98 97 (!) 52  Resp:  19 20 19   Temp:  98.3 F (36.8 C) 97.9 F (36.6 C) 97.6 F (36.4 C)  TempSrc:  Oral Oral Oral  SpO2: 95% 96% 94% 97%  Weight:      Height:        Wt Readings from Last 3 Encounters:  11/30/16 89.9 kg (198 lb 3.1 oz)  11/09/16 85.7 kg (189 lb)  10/14/16 81.8 kg (180 lb 4.8 oz)     Intake/Output Summary (Last 24 hours) at 11/30/16 1744 Last data filed at 11/30/16 1438  Gross per 24 hour  Intake              240 ml  Output              925 ml  Net             -685 ml     Physical Exam  Gen:- Awake Alert,  Wife at bedside, in no apparent distress  HEENT:- Devin Heath, No sclera icterus Neck-Supple Neck,No JVD,.  Lungs-  CTAB  CV- S1, S2 normal, irregular Abd-  +ve B.Sounds, Abd Soft, No tenderness, no CVA area tenderness   Skin:- Left subcostal/flank area with erythematous vesicular eruptions without excoriation or significant crusting at this time, the lesions are in dermatomal distribution    Data Review:   Micro Results Recent Results (from the past 240 hour(s))  MRSA PCR Screening     Status: None   Collection Time: 11/21/16  8:32 PM  Result Value Ref Range Status   MRSA by PCR NEGATIVE NEGATIVE Final    Comment:         The GeneXpert MRSA Assay (FDA approved for NASAL specimens only), is one component of a comprehensive MRSA colonization surveillance program. It is not intended to diagnose MRSA infection nor to guide or monitor treatment for MRSA infections.   Culture, Urine     Status: Abnormal   Collection Time: 11/24/16  8:31 PM  Result Value Ref Range Status   Specimen Description URINE, CLEAN CATCH  Final   Special Requests NONE  Final   Culture <10,000 COLONIES/mL INSIGNIFICANT GROWTH (A)  Final   Report Status 11/26/2016 FINAL  Final  Culture, blood (routine x 2)     Status: None   Collection Time: 11/24/16 10:31 PM  Result Value Ref Range Status   Specimen Description BLOOD RIGHT ANTECUBITAL  Final   Special Requests BOTTLES DRAWN AEROBIC AND ANAEROBIC 5CC  Final   Culture NO GROWTH 5 DAYS  Final   Report Status 11/30/2016 FINAL  Final  Culture, blood (routine x 2)     Status: None   Collection Time: 11/25/16  1:52 AM  Result Value Ref Range Status   Specimen Description BLOOD RIGHT ARM  Final   Special Requests IN PEDIATRIC BOTTLE 01/23/17  Final   Culture NO GROWTH 5 DAYS  Final   Report Status 11/30/2016 FINAL  Final  Culture, blood (Routine X 2) w Reflex to ID Panel     Status: None   Collection Time: 11/25/16  8:10 AM  Result Value Ref Range Status   Specimen Description BLOOD RIGHT HAND  Final   Special Requests IN PEDIATRIC BOTTLE 2CC  Final   Culture NO GROWTH 5 DAYS  Final   Report Status 11/30/2016 FINAL  Final  Culture, blood (  Routine X 2) w Reflex to ID Panel     Status: None   Collection Time: 11/25/16  8:16 AM  Result Value Ref Range Status   Specimen Description BLOOD LEFT HAND  Final   Special Requests BOTTLES DRAWN AEROBIC ONLY 5CC  Final   Culture NO GROWTH 5 DAYS  Final   Report Status 11/30/2016 FINAL  Final    Radiology Reports Dg Cervical Spine 2 Or 3 Views  Result Date: 11/26/2016 CLINICAL DATA:  neck pain EXAM: CERVICAL SPINE - 2-3 VIEW COMPARISON:   None. FINDINGS: Normal alignment of the cervical spine. Ventral syndesmophytes are noted from C2 through C6. The disc spaces are relatively well preserved. No fracture identified. IMPRESSION: 1. Multi level ventral syndesmophytes noted which may reflect diffuse idiopathic skeletal hyperostosis (DISH) Electronically Signed   By: Signa Kell M.D.   On: 11/26/2016 15:06   Dg Thoracic Spine 2 View  Result Date: 11/26/2016 CLINICAL DATA:  Back pain for 6 weeks. EXAM: THORACIC SPINE 2 VIEWS COMPARISON:  None. FINDINGS: Exaggerated thoracic kyphosis but normal alignment of the vertebral bodies. There are findings suggestive of DISH. No acute fracture or bone lesion. No abnormal paraspinal soft tissue swelling. Tortuosity and calcification of the thoracic aorta is noted. The visualized posterior ribs are intact. IMPRESSION: Moderate to advanced degenerative changes involving the spine with probable changes of DISH. Exaggerated thoracic kyphosis but no acute fracture. Electronically Signed   By: Rudie Meyer M.D.   On: 11/26/2016 15:13   Dg Chest Port 1 View  Result Date: 11/25/2016 CLINICAL DATA:  Leukocytosis. EXAM: PORTABLE CHEST 1 VIEW COMPARISON:  11/24/2016 FINDINGS: Prior CABG. Heart is borderline in size. Diffuse interstitial prominence throughout the lungs with peribronchial thickening. Mild hyperinflation. No confluent opacity or effusion. No acute bony abnormality. IMPRESSION: Hyperinflation/ COPD.  Chronic bronchitic changes. Electronically Signed   By: Charlett Nose M.D.   On: 11/25/2016 09:41   Dg Chest Port 1 View  Result Date: 11/24/2016 CLINICAL DATA:  Initial evaluation for acute shortness of breath. EXAM: PORTABLE CHEST 1 VIEW COMPARISON:  Prior radiograph from 11/21/2016. FINDINGS: Median sternotomy wires with underlying CABG markers and surgical clips, stable. Mild cardiomegaly is unchanged. Mediastinal silhouette within normal limits. Aortic atherosclerosis noted. Lungs normally inflated. No  focal infiltrates. No pulmonary edema or pleural effusion. No pneumothorax. No acute osseous abnormality. IMPRESSION: 1. No active cardiopulmonary disease. 2. Mild cardiomegaly with sequelae of prior CABG. Electronically Signed   By: Rise Mu M.D.   On: 11/24/2016 22:00   Dg Chest Portable 1 View  Result Date: 11/21/2016 CLINICAL DATA:  Pt with weight gain this AM of 2 lbs, pt has increased SOB. Hx CHF, COPD, CAD, afib EXAM: PORTABLE CHEST 1 VIEW COMPARISON:  10/13/2016 FINDINGS: Status post median sternotomy and CABG. Heart size is normal. Lungs are clear. No pulmonary edema. Degenerative changes are seen in thoracic spine. IMPRESSION: No evidence for acute  abnormality. Electronically Signed   By: Norva Pavlov M.D.   On: 11/21/2016 17:03     CBC  Recent Labs Lab 11/25/16 0152 11/26/16 0600 11/27/16 0414 11/29/16 0607 11/30/16 0654  WBC 21.8* 12.5* 12.0* 10.8* 11.8*  HGB 9.7* 8.7* 8.5* 8.8* 9.0*  HCT 29.3* 27.4* 26.3* 27.3* 28.0*  PLT 225 149* 151 163 180  MCV 83.0 87.0 84.0 84.0 84.1  MCH 27.5 27.6 27.2 27.1 27.0  MCHC 33.1 31.8 32.3 32.2 32.1  RDW 15.8* 15.8* 15.2 15.2 15.2  LYMPHSABS  --  0.9 0.6*  --   --  MONOABS  --  0.9 0.5  --   --   EOSABS  --  0.1 0.0  --   --   BASOSABS  --  0.0 0.0  --   --     Chemistries   Recent Labs Lab 11/24/16 0350  11/25/16 0152 11/26/16 0600 11/27/16 0414 11/28/16 0534 11/29/16 0607 11/30/16 0654  NA 132*  < > 139 139 135  --  137 138  K 4.1  < > 5.1 3.2* 4.3  --  3.5 3.6  CL 98*  < > 103 104 100*  --  98* 98*  CO2 25  < > 24 27 29   --  30 31  GLUCOSE 305*  < > 279* 97 285*  --  239* 236*  BUN 57*  < > 54* 47* 48*  --  42* 34*  CREATININE 2.07*  < > 2.05* 1.85* 1.70*  --  1.68* 1.46*  CALCIUM 8.7*  < > 8.7* 8.2* 7.9*  --  7.9* 8.0*  MG  --   --  2.4 2.2 2.1 2.0 1.9  --   AST 17  --   --   --   --   --   --   --   ALT 14*  --   --   --   --   --   --   --   ALKPHOS 60  --   --   --   --   --   --   --     BILITOT 0.6  --   --   --   --   --   --   --   < > = values in this interval not displayed. ------------------------------------------------------------------------------------------------------------------ No results for input(s): CHOL, HDL, LDLCALC, TRIG, CHOLHDL, LDLDIRECT in the last 72 hours.  Lab Results  Component Value Date   HGBA1C 10.2 (H) 10/13/2016   ------------------------------------------------------------------------------------------------------------------ No results for input(s): TSH, T4TOTAL, T3FREE, THYROIDAB in the last 72 hours.  Invalid input(s): FREET3 ------------------------------------------------------------------------------------------------------------------ No results for input(s): VITAMINB12, FOLATE, FERRITIN, TIBC, IRON, RETICCTPCT in the last 72 hours.  Coagulation profile No results for input(s): INR, PROTIME in the last 168 hours.  No results for input(s): DDIMER in the last 72 hours.  Cardiac Enzymes  Recent Labs Lab 11/26/16 1044  TROPONINI <0.03   ------------------------------------------------------------------------------------------------------------------    Component Value Date/Time   BNP 79.9 11/09/2016 1302     Devin Heath M.D on 11/30/2016 at 5:44 PM  Between 7am to 7pm - Pager - [phone]  After 7pm go to [email or web address] - password [email or web address]  [facility] -  Office  [phone]  Dragon dictation system was used to create this note, attempts have been made to correct errors, however presence of uncorrected errors is not a reflection quality of care provided

## 2016-11-30 NOTE — Clinical Social Work Note (Signed)
Clinical Social Work Assessment  Patient Details  Name: Devin Heath MRN: [id number] Date of Birth: 1928-12-11  Date of referral:  11/30/16               Reason for consult:  Facility Placement, Discharge Planning                Permission sought to share information with:  Facility Sport and exercise psychologist, Family Supports Permission granted to share information::  Yes, Verbal Permission Granted  Name::        Agency::  SNF's  Relationship::     Contact Information:     Housing/Transportation Living arrangements for the past 2 months:  Single Family Home Source of Information:  Patient, Medical Team Patient Interpreter Needed:  None Criminal Activity/Legal Involvement Pertinent to Current Situation/Hospitalization:  No - Comment as needed Significant Relationships:  Adult Children, Spouse Lives with:  Spouse Do you feel safe going back to the place where you live?  Yes Need for family participation in patient care:  Yes (Comment)  Care giving concerns:  PT recommending SNF once medically stable for discharge.   Social Worker assessment / plan:  CSW met with patient. No supports at bedside. CSW introduced role and explained that PT recommendations would be discussed. Patient agreeable to SNF and wants to stay in [place] where he lives. CSW received call from [place] with [facility] this morning, stating that family had been at the facility and are interested in them. No further concerns. CSW encouraged patient to contact CSW as needed. Patient declined having CSW contact family. CSW will continue to follow patient for support and facilitate discharge to SNF once medically stable.  Employment status:  Retired Forensic scientist:  Medicare PT Recommendations:  [place] / Referral to community resources:  [place]  Patient/Family's Response to care:  Patient agreeable to SNF placement. Patient's family supportive and involved in  patient's care. Patient appreciated social work intervention.  Patient/Family's Understanding of and Emotional Response to Diagnosis, Current Treatment, and Prognosis:  Patient has a good understanding of current hospitalization and plan for discharge.   Emotional Assessment Appearance:  Appears stated age Attitude/Demeanor/Rapport:  Other (Irritable) Affect (typically observed):  Frustrated, Flat, Withdrawn Orientation:  Oriented to Self, Oriented to Place, Oriented to  Time, Oriented to Situation Alcohol / Substance use:  Tobacco Use Psych involvement (Current and /or in the community):  No (Comment)  Discharge Needs  Concerns to be addressed:  Care Coordination Readmission within the last 30 days:  No Current discharge risk:  Dependent with Mobility Barriers to Discharge:  Continued Medical Work up   Candie Chroman, LCSW 11/30/2016, 2:06 PM

## 2016-11-30 NOTE — NC FL2 (Signed)
[place] MEDICAID FL2 LEVEL OF CARE SCREENING TOOL     IDENTIFICATION  Patient Name: Devin Heath Birthdate: 03-11-1929 Sex: male Admission Date (Current Location): 11/21/2016  [facility] and [place] Number:  [employer] and Address:  [facility], [address] [place], [place] [address]      Provider Number: [id number]  Attending Physician Name and Address:  Shon Hale, MD  Relative Name and Phone Number:       Current Level of Care: Hospital Recommended Level of Care: Skilled Nursing Facility Prior Approval Number:    Date Approved/Denied:   PASRR Number: [id number]  Discharge Plan: SNF    Current Diagnoses: Patient Active Problem List   Diagnosis Date Noted  . Shingles (Lt Flank) 11/28/2016  . CAD in native artery   . Upper GI bleed   . Neck pain   . Ischemic dilated cardiomyopathy (HCC)   . DISH (diffuse idiopathic skeletal hyperostosis)   . Atrial fibrillation with RVR (HCC)   . Chronic diastolic CHF (congestive heart failure) (HCC)   . Acute renal failure superimposed on stage 4 chronic kidney disease (HCC)   . Uncontrolled type 2 diabetes mellitus with complication (HCC)   . Type 2 diabetes mellitus with ketoacidotic coma, without long-term current use of insulin   . Chronic anticoagulation   . Lactic acidosis 11/21/2016  . GIB (gastrointestinal bleeding) 11/21/2016  . Melena   . Acute blood loss anemia   . Coronary artery disease involving coronary bypass graft of native heart without angina pectoris   . Hypokalemia 10/13/2016  . Chest pain with high risk for cardiac etiology 10/13/2016  . Abnormal EKG 10/13/2016  . Hyperglycemia 10/13/2016  . Leukocytosis 10/13/2016  . Acute on chronic renal insufficiency   . OSA (obstructive sleep apnea)   . Ischemic cardiomyopathy   . IBS (irritable bowel syndrome)   . Hyperlipidemia   . Heart failure (HCC)   . Diabetes (HCC)   . Chronic obstructive pulmonary disease  (HCC)   . CAD (coronary artery disease)   . Rheumatoid arthritis (HCC)   . Atrial fibrillation with rapid ventricular response (HCC)   . OSA on CPAP   . RLS (restless legs syndrome)   . Vitamin D deficiency   . Gout   . Hard of hearing   . Former smoker     Orientation RESPIRATION BLADDER Height & Weight     Self, Time, Situation, Place  Normal (Refuses cpap at night. Has not used it this admission.) Continent Weight: 198 lb 3.1 oz (89.9 kg) Height:  5\' 7"  (170.2 cm)  BEHAVIORAL SYMPTOMS/MOOD NEUROLOGICAL BOWEL NUTRITION STATUS   (None)  (None) Incontinent Diet (Heart healthy/carb modified)  AMBULATORY STATUS COMMUNICATION OF NEEDS Skin   Extensive Assist Verbally Bruising                       Personal Care Assistance Level of Assistance              Functional Limitations Info  Sight, Hearing, Speech Sight Info: Adequate Hearing Info: Adequate Speech Info: Adequate    SPECIAL CARE FACTORS FREQUENCY  PT (By licensed PT), Diabetic urine testing     PT Frequency: 5 x week              Contractures Contractures Info: Not present    Additional Factors Info  Code Status, Allergies, Isolation Precautions Code Status Info: DNR Allergies Info: Ace Inhibitors, Ambrisentan, Clindamycin/lincomycin, Erythromycin, Feldene (  Piroxicam), Keflex (Cephalexin), Omnicef (Cefdinir), Simvastatin     Isolation Precautions Info: Droplet/Contact Precautions: Shingles     Current Medications (11/30/2016):  This is the current hospital active medication list Current Facility-Administered Medications  Medication Dose Route Frequency Provider Last Rate Last Dose  . acetaminophen (TYLENOL) tablet 650 mg  650 mg Oral Q6H PRN Drema Dallas, MD   650 mg at 11/26/16 1740  . albuterol (PROVENTIL) (2.5 MG/3ML) 0.083% nebulizer solution 2.5 mg  2.5 mg Nebulization Q4H PRN Leslye Peer, MD      . allopurinol (ZYLOPRIM) tablet 100 mg  100 mg Oral Daily Leroy Sea, MD   100 mg at  11/30/16 0827  . carvedilol (COREG) tablet 25 mg  25 mg Oral BID WC Leroy Sea, MD   25 mg at 11/30/16 0827  . gabapentin (NEURONTIN) capsule 100 mg  100 mg Oral BID Shon Hale, MD   100 mg at 11/30/16 0827  . hydrALAZINE (APRESOLINE) injection 10 mg  10 mg Intravenous Q6H PRN Leroy Sea, MD      . HYDROcodone-acetaminophen (NORCO/VICODIN) 5-325 MG per tablet 1 tablet  1 tablet Oral Q8H PRN Shon Hale, MD   1 tablet at 11/29/16 1944  . insulin aspart (novoLOG) injection 0-15 Units  0-15 Units Subcutaneous TID WC Leda Gauze, NP   5 Units at 11/30/16 [phone]  . insulin glargine (LANTUS) injection 10 Units  10 Units Subcutaneous BID Leroy Sea, MD   10 Units at 11/30/16 [phone]  . mirtazapine (REMERON) tablet 7.5 mg  7.5 mg Oral QHS Shon Hale, MD   7.5 mg at 11/29/16 2154  . pantoprazole (PROTONIX) EC tablet 40 mg  40 mg Oral Daily Dianah Field, PA-C   40 mg at 11/30/16 9038  . polyethylene glycol (MIRALAX / GLYCOLAX) packet 17 g  17 g Oral Daily Courage Emokpae, MD   17 g at 11/30/16 0830  . predniSONE (DELTASONE) tablet 15 mg  15 mg Oral Q breakfast Drema Dallas, MD   15 mg at 11/30/16 3338  . promethazine (PHENERGAN) injection 6.25-12.5 mg  6.25-12.5 mg Intravenous Q15 min PRN Heather Roberts, MD      . terazosin (HYTRIN) capsule 2 mg  2 mg Oral QHS Leroy Sea, MD   2 mg at 11/29/16 2155  . tiotropium (SPIRIVA) inhalation capsule 18 mcg  18 mcg Inhalation Daily Leroy Sea, MD   18 mcg at 11/30/16 0736  . torsemide (DEMADEX) tablet 20 mg  20 mg Oral Daily Courage Emokpae, MD   20 mg at 11/30/16 0830  . valACYclovir (VALTREX) tablet 1,000 mg  1,000 mg Oral BID Shon Hale, MD   1,000 mg at 11/30/16 3291     Discharge Medications: Please see discharge summary for a list of discharge medications.  Relevant Imaging Results:  Relevant Lab Results:   Additional Information SS#: [id number]  Margarito Liner, LCSW

## 2016-11-30 NOTE — Progress Notes (Addendum)
Palliative Care Progress Note  Reason for consult: Establish goals of care and pain control related to his shingles  I met with Mr. Gentzler.  He reports that he has been feeling better over the past couple of days.  He has found the Norco to be effective in controlling his pain.  Recommend continue same.  We reviewed the MOST form again and discussed how to develop plan of care to focus on continuing therapies that would maximize chance of being well enough to return home and limiting therapies not in line with this goal.  We completed MOST form today. DNR, Limited additional interventions, IVF and ABX if indicated, no feeding tube.  As goals established and pain controlled, palliative to sign off at this time.  As always, we would be happy to reengage at any point if we can be of further assistance in the care of Mr. Stukey.  Time Spent: 30 minutes Greater than 50%  of this time was spent counseling and coordinating care related to the above assessment and plan.  Micheline Rough, MD [place] Team [phone]

## 2016-11-30 NOTE — Clinical Social Work Placement (Signed)
   CLINICAL SOCIAL WORK PLACEMENT  NOTE  Date:  11/30/2016  Patient Details  Name: Devin Heath MRN: [id number] Date of Birth: 1928/12/08  Clinical Social Work is seeking post-discharge placement for this patient at the Skilled  Nursing Facility level of care (*CSW will initial, date and re-position this form in  chart as items are completed):  Yes   Patient/family provided with [place] Clinical Social Work Department's list of facilities offering this level of care within the geographic area requested by the patient (or if unable, by the patient's family).  Yes   Patient/family informed of their freedom to choose among providers that offer the needed level of care, that participate in Medicare, Medicaid or managed care program needed by the patient, have an available bed and are willing to accept the patient.  Yes   Patient/family informed of 's ownership interest in [facility] and [facility], as well as of the fact that they are under no obligation to receive care at these facilities.  PASRR submitted to EDS on 11/30/16     PASRR number received on 11/30/16     Existing PASRR number confirmed on       FL2 transmitted to all facilities in geographic area requested by pt/family on 11/30/16     FL2 transmitted to all facilities within larger geographic area on       Patient informed that his/her managed care company has contracts with or will negotiate with certain facilities, including the following:            Patient/family informed of bed offers received.  Patient chooses bed at       Physician recommends and patient chooses bed at      Patient to be transferred to   on  .  Patient to be transferred to facility by       Patient family notified on   of transfer.  Name of family member notified:        PHYSICIAN Please sign FL2     Additional Comment:    _______________________________________________ Margarito Liner, LCSW 11/30/2016, 2:08  PM

## 2016-11-30 NOTE — Progress Notes (Signed)
Report called to recieving nurse on [phone] and all question answered.

## 2016-11-30 NOTE — Progress Notes (Signed)
Tech offered Pt a bath. Pt stated he had already had a bath this am. Tech informed Pt that a bath was not noted in the chart. Pt acknowledged and continue to say that he did get a bath. Wife is at bedside.

## 2016-12-01 LAB — GLUCOSE, CAPILLARY
GLUCOSE-CAPILLARY: 199 mg/dL — AB (ref 65–99)
Glucose-Capillary: 325 mg/dL — ABNORMAL HIGH (ref 65–99)

## 2016-12-01 MED ORDER — PREGABALIN 75 MG PO CAPS: 75.0000 mg | ORAL_CAPSULE | Freq: Two times a day (BID) | ORAL | Status: DC

## 2016-12-01 MED ORDER — PREGABALIN 75 MG PO CAPS: 75.0000 mg | ORAL_CAPSULE | Freq: Two times a day (BID) | ORAL | 0 refills | Status: DC

## 2016-12-01 MED ORDER — INSULIN ASPART 100 UNIT/ML ~~LOC~~ SOLN: 0.0000 [IU] | Freq: Every day | SUBCUTANEOUS | Status: DC

## 2016-12-01 MED ORDER — VALACYCLOVIR HCL 1 G PO TABS: 1000.0000 mg | ORAL_TABLET | Freq: Two times a day (BID) | ORAL | 0 refills | Status: AC

## 2016-12-01 MED ORDER — HYDROCODONE-ACETAMINOPHEN 5-325 MG PO TABS: 1.0000 | ORAL_TABLET | Freq: Three times a day (TID) | ORAL | 0 refills | Status: DC | PRN

## 2016-12-01 MED ORDER — OMEPRAZOLE 40 MG PO CPDR: 40.0000 mg | DELAYED_RELEASE_CAPSULE | Freq: Every day | ORAL | 0 refills | Status: DC

## 2016-12-01 MED ORDER — INSULIN ASPART 100 UNIT/ML ~~LOC~~ SOLN
0.0000 [IU] | Freq: Three times a day (TID) | SUBCUTANEOUS | Status: DC
  Administered 2016-12-01: 7 [IU] via SUBCUTANEOUS

## 2016-12-01 MED ORDER — TORSEMIDE 20 MG PO TABS: 20.0000 mg | ORAL_TABLET | Freq: Every day | ORAL | 0 refills | Status: DC

## 2016-12-01 MED ORDER — INSULIN ASPART 100 UNIT/ML ~~LOC~~ SOLN
4.0000 [IU] | Freq: Three times a day (TID) | SUBCUTANEOUS | Status: DC
  Administered 2016-12-01: 4 [IU] via SUBCUTANEOUS

## 2016-12-01 NOTE — Progress Notes (Signed)
Pt. agreed to use cpap tonite, stated using Lg FFM with 2 lpm added at nite with humidity auto setting, just received meal at bedside, RT to stop back at later time.

## 2016-12-01 NOTE — Discharge Summary (Signed)
Physician Discharge Summary  Devin Heath ZDG:[id number] DOB: July 11, 1929 DOA: 11/21/2016  PCP: [name], MD  Admit date: 11/21/2016 Discharge date: 12/01/2016  Admitted From: Home Disposition:  SNF  Recommendations for Outpatient Follow-up:  1. Follow up with PCP in 1-2 weeks 2. Please obtain BMP/CBC in one week  Home Health: No  Equipment/Devices: None   Discharge Condition: Stable CODE STATUS: DNR  Diet recommendation: Heart healthy   Brief/Interim Summary: Devin Heath is a 83 year old male with past medical history significant for Afib on Eliquis, ICM w/ EF 20%, stage IV CKD, COPD and OSA. 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 He was just started (10/31/16) on Eliquis (w/ CHADVASC 5). Per pt and family, pt had been feeling weaker for the last 2 days prior to admission. On day of admit, pt could not get out of bed. On arrival to [facility], he had large melanic stool. Lactic acid was 8.6, initial Hgb 10.3 (baseline ~11). He was treated w/ IV dilt, typed/screened and transfused 1 u pRBC. PCCM asked to admit on 12/30/2017with GI bleed. Patient underwent EGD on 1/3 and colonoscopy on 1/4, capsule endoscopy was negative. After discussion, family and patient agreed to stop Eliquis and any other full anticoagulation due to risk of bleed. They understand the increased risk of stroke being off of anticoagulation. During hospitalization, patient also had a left chest shingles outbreak. This was treated with Valtrex, gabapentin, norco. Shingles outbreak crusted over and patient was stable for discharge to SNF.   Discharge Diagnoses:  Active Problems:   Lactic acidosis   GIB (gastrointestinal bleeding)   Melena   Acute blood loss anemia   Coronary artery disease involving coronary bypass graft of native heart without angina pectoris   Chronic anticoagulation   Atrial  fibrillation with RVR (HCC)   Chronic diastolic CHF (congestive heart failure) (HCC)   Acute renal failure superimposed on stage 4 chronic kidney disease (HCC)   Uncontrolled type 2 diabetes mellitus with complication (HCC)   Type 2 diabetes mellitus with ketoacidotic coma, without long-term current use of insulin   CAD in native artery   Upper GI bleed   Neck pain   Ischemic dilated cardiomyopathy (HCC)   DISH (diffuse idiopathic skeletal hyperostosis)   Shingles ([place])  Acute GI Bleed, melena, in setting of Eliquis usefor A fib -EGD 1/2: normal -Colonoscopy 1/3: normal  -Capsule endoscopy 1/4: negative  -Transfused1 unit of packed red blood cells on 11/24/16 and Hgb stable in 8-9 range  -PPI daily -Follow up with GI as needed basis   Chronic atrial fibrillation/ischemic cardiomyopathy with EF in the 20% range -CHADSVASCscore 5, started on Eliquis on 10/31/2016 by Dr. Shirlee Latch -Patient and family decided to stay off anticoagulation at this time, they understand increased risk of stroke -Continue Coreg 25 mg twice a day,torsemidedecreased to20mg  daily due to AKI and dehydration   AKI on CKD stage 3-4 -Now resolved, Cr 1.46   Left chest/flank shingles -Lesions noted and diagnosed on 11/27/2016 and lesions now crusted  -Valtrex 1 g BID for 1 week -Norco, lyrica for pain control  Gout -Continue allopurinol, prednisone   DM type 2 -Lantus 10 mg twice a day,Use Novolog/Humalog Sliding scale. Resume home insulin regimen with close follow up with PCP    Discharge Instructions  Discharge Instructions    Diet - low sodium heart healthy    Complete by:  As directed    Increase activity slowly    Complete by:  As directed      Allergies as of 12/01/2016      Reactions   Ace Inhibitors Cough   Ambrisentan Other (See Comments)   Clindamycin/lincomycin Other (See Comments)   Erythromycin Other (See Comments)   Feldene [piroxicam] Other (See Comments)   Keflex  [cephalexin] Other (See Comments)   Omnicef [cefdinir] Other (See Comments)   Simvastatin Other (See Comments)      Medication List    STOP taking these medications   apixaban 2.5 MG Tabs tablet Commonly known as:  ELIQUIS   potassium chloride 10 MEQ tablet Commonly known as:  K-DUR,KLOR-CON     TAKE these medications   acetaminophen 500 MG tablet Commonly known as:  TYLENOL Take 500 mg by mouth every 6 (six) hours as needed for moderate pain.   albuterol 108 (90 Base) MCG/ACT inhaler Commonly known as:  PROVENTIL HFA;VENTOLIN HFA Inhale 2 puffs into the lungs every 6 (six) hours as needed for wheezing or shortness of breath.   ALLERGY RELIEF 25 mg capsule Generic drug:  diphenhydrAMINE Take 25 mg by mouth every 6 (six) hours as needed for itching or allergies.   allopurinol 100 MG tablet Commonly known as:  ZYLOPRIM Take 100 mg by mouth daily.   APIDRA 100 UNIT/ML injection Generic drug:  insulin glulisine Inject 14 Units into the skin 3 (three) times daily before meals.   carvedilol 25 MG tablet Commonly known as:  COREG Take 25 mg by mouth 2 (two) times daily with a meal.   cetirizine 5 MG tablet Commonly known as:  ZYRTEC Take 5 mg by mouth daily as needed for allergies.   cholestyramine 4 g packet Commonly known as:  QUESTRAN Take 4 g by mouth 2 (two) times daily.   Co Q-10 100 MG Caps Take 100 mg by mouth daily.   HYDROcodone-acetaminophen 5-325 MG tablet Commonly known as:  NORCO/VICODIN Take 1 tablet by mouth every 8 (eight) hours as needed for moderate pain or severe pain.   INCRUSE ELLIPTA 62.5 MCG/INH Aepb Generic drug:  umeclidinium bromide Inhale 1 puff into the lungs daily.   mirtazapine 15 MG tablet Commonly known as:  REMERON Take 15 mg by mouth at bedtime.   omeprazole 40 MG capsule Commonly known as:  PRILOSEC Take 1 capsule (40 mg total) by mouth daily. What changed:  medication strength  how much to take   pravastatin 20 MG  tablet Commonly known as:  PRAVACHOL Take 20 mg by mouth daily.   predniSONE 10 MG tablet Commonly known as:  DELTASONE Take 15 mg by mouth daily with breakfast.   pregabalin 75 MG capsule Commonly known as:  LYRICA Take 1 capsule (75 mg total) by mouth 2 (two) times daily.   promethazine 25 MG tablet Commonly known as:  PHENERGAN Take 25 mg by mouth every 6 (six) hours as needed for nausea or vomiting.   terazosin 2 MG capsule Commonly known as:  HYTRIN Take 2 mg by mouth at bedtime.   tiotropium 18 MCG inhalation capsule Commonly known as:  SPIRIVA Place 18 mcg into inhaler and inhale daily.   torsemide 20 MG tablet Commonly known as:  DEMADEX Take 1 tablet (20 mg total) by mouth daily. What changed:  how much to take  how to take this  when to take this  additional instructions   TOUJEO SOLOSTAR 300 UNIT/ML Sopn Generic drug:  Insulin Glargine Inject  40 Units into the skin at bedtime.   valACYclovir 1000 MG tablet Commonly known as:  VALTREX Take 1 tablet (1,000 mg total) by mouth 2 (two) times daily.   Vitamin D3 1000 units Caps Take 2,000 Units by mouth daily.       Allergies  Allergen Reactions  . Ace Inhibitors Cough  . Ambrisentan Other (See Comments)  . Clindamycin/Lincomycin Other (See Comments)  . Erythromycin Other (See Comments)  . Feldene [Piroxicam] Other (See Comments)  . Keflex [Cephalexin] Other (See Comments)  . Omnicef [Cefdinir] Other (See Comments)  . Simvastatin Other (See Comments)    Consultations:  PCCM  GI  Palliative care   Procedures/Studies: Dg Cervical Spine 2 Or 3 Views  Result Date: 11/26/2016 CLINICAL DATA:  neck pain EXAM: CERVICAL SPINE - 2-3 VIEW COMPARISON:  None. FINDINGS: Normal alignment of the cervical spine. Ventral syndesmophytes are noted from C2 through C6. The disc spaces are relatively well preserved. No fracture identified. IMPRESSION: 1. Multi level ventral syndesmophytes noted which may  reflect diffuse idiopathic skeletal hyperostosis (DISH) Electronically Signed   By: Signa Kell M.D.   On: 11/26/2016 15:06   Dg Thoracic Spine 2 View  Result Date: 11/26/2016 CLINICAL DATA:  Back pain for 6 weeks. EXAM: THORACIC SPINE 2 VIEWS COMPARISON:  None. FINDINGS: Exaggerated thoracic kyphosis but normal alignment of the vertebral bodies. There are findings suggestive of DISH. No acute fracture or bone lesion. No abnormal paraspinal soft tissue swelling. Tortuosity and calcification of the thoracic aorta is noted. The visualized posterior ribs are intact. IMPRESSION: Moderate to advanced degenerative changes involving the spine with probable changes of DISH. Exaggerated thoracic kyphosis but no acute fracture. Electronically Signed   By: Rudie Meyer M.D.   On: 11/26/2016 15:13   Dg Chest Port 1 View  Result Date: 11/25/2016 CLINICAL DATA:  Leukocytosis. EXAM: PORTABLE CHEST 1 VIEW COMPARISON:  11/24/2016 FINDINGS: Prior CABG. Heart is borderline in size. Diffuse interstitial prominence throughout the lungs with peribronchial thickening. Mild hyperinflation. No confluent opacity or effusion. No acute bony abnormality. IMPRESSION: Hyperinflation/ COPD.  Chronic bronchitic changes. Electronically Signed   By: Charlett Nose M.D.   On: 11/25/2016 09:41   Dg Chest Port 1 View  Result Date: 11/24/2016 CLINICAL DATA:  Initial evaluation for acute shortness of breath. EXAM: PORTABLE CHEST 1 VIEW COMPARISON:  Prior radiograph from 11/21/2016. FINDINGS: Median sternotomy wires with underlying CABG markers and surgical clips, stable. Mild cardiomegaly is unchanged. Mediastinal silhouette within normal limits. Aortic atherosclerosis noted. Lungs normally inflated. No focal infiltrates. No pulmonary edema or pleural effusion. No pneumothorax. No acute osseous abnormality. IMPRESSION: 1. No active cardiopulmonary disease. 2. Mild cardiomegaly with sequelae of prior CABG. Electronically Signed   By: Rise Mu M.D.   On: 11/24/2016 22:00   Dg Chest Portable 1 View  Result Date: 11/21/2016 CLINICAL DATA:  Pt with weight gain this AM of 2 lbs, pt has increased SOB. Hx CHF, COPD, CAD, afib EXAM: PORTABLE CHEST 1 VIEW COMPARISON:  10/13/2016 FINDINGS: Status post median sternotomy and CABG. Heart size is normal. Lungs are clear. No pulmonary edema. Degenerative changes are seen in thoracic spine. IMPRESSION: No evidence for acute  abnormality. Electronically Signed   By: Norva Pavlov M.D.   On: 11/21/2016 17:03   -EGD 1/2: normal -Colonoscopy 1/3: normal  -Capsule endoscopy 1/4: negative    Discharge Exam: Vitals:   12/01/16 0635 12/01/16 0850  BP: 122/64 133/71  Pulse:  (!) 105  Resp:  18  Temp: 98.8 F (37.1 C) 98 F (36.7 C)   Vitals:   11/30/16 1541 11/30/16 2111 12/01/16 0635 12/01/16 0850  BP: 115/61 119/67 122/64 133/71  Pulse: (!) 52   (!) 105  Resp: 19   18  Temp: 97.6 F (36.4 C) 99.1 F (37.3 C) 98.8 F (37.1 C) 98 F (36.7 C)  TempSrc: Oral Oral Axillary Oral  SpO2: 97% 98% 100% 96%  Weight:   89.8 kg (197 lb 15.6 oz)   Height:        General: Pt is alert, awake, not in acute distress  Cardiovascular: Irreg rhythm, S1/S2 +, no rubs, no gallops Respiratory: CTA bilaterally, no wheezing, no rhonchi Abdominal: Soft, NT, ND, bowel sounds + Extremities: no edema, no cyanosis Skin: herpetic lesions left chest/flank, no blisters, no drainage, +crusting     The results of significant diagnostics from this hospitalization (including imaging, microbiology, ancillary and laboratory) are listed below for reference.     Microbiology: Recent Results (from the past 240 hour(s))  MRSA PCR Screening     Status: None   Collection Time: 11/21/16  8:32 PM  Result Value Ref Range Status   MRSA by PCR NEGATIVE NEGATIVE Final    Comment:        The GeneXpert MRSA Assay (FDA approved for NASAL specimens only), is one component of a comprehensive MRSA  colonization surveillance program. It is not intended to diagnose MRSA infection nor to guide or monitor treatment for MRSA infections.   Culture, Urine     Status: Abnormal   Collection Time: 11/24/16  8:31 PM  Result Value Ref Range Status   Specimen Description URINE, CLEAN CATCH  Final   Special Requests NONE  Final   Culture <10,000 COLONIES/mL INSIGNIFICANT GROWTH (A)  Final   Report Status 11/26/2016 FINAL  Final  Culture, blood (routine x 2)     Status: None   Collection Time: 11/24/16 10:31 PM  Result Value Ref Range Status   Specimen Description BLOOD RIGHT ANTECUBITAL  Final   Special Requests BOTTLES DRAWN AEROBIC AND ANAEROBIC 5CC  Final   Culture NO GROWTH 5 DAYS  Final   Report Status 11/30/2016 FINAL  Final  Culture, blood (routine x 2)     Status: None   Collection Time: 11/25/16  1:52 AM  Result Value Ref Range Status   Specimen Description BLOOD RIGHT ARM  Final   Special Requests IN PEDIATRIC BOTTLE  Final   Culture NO GROWTH 5 DAYS  Final   Report Status 11/30/2016 FINAL  Final  Culture, blood (Routine X 2) w Reflex to ID Panel     Status: None   Collection Time: 11/25/16  8:10 AM  Result Value Ref Range Status   Specimen Description BLOOD RIGHT HAND  Final   Special Requests IN PEDIATRIC BOTTLE 2CC  Final   Culture NO GROWTH 5 DAYS  Final   Report Status 11/30/2016 FINAL  Final  Culture, blood (Routine X 2) w Reflex to ID Panel     Status: None   Collection Time: 11/25/16  8:16 AM  Result Value Ref Range Status   Specimen Description BLOOD LEFT HAND  Final   Special Requests BOTTLES DRAWN AEROBIC ONLY 5CC  Final   Culture NO GROWTH 5 DAYS  Final   Report Status 11/30/2016 FINAL  Final     Labs: BNP (last 3 results)  Recent Labs  10/13/16 1320 11/09/16 1302  BNP 114.8* 79.9   Basic  Metabolic Panel:  Recent Labs Lab 11/25/16 0152 11/26/16 0600 11/27/16 0414 11/28/16 0534 11/29/16 0607 11/30/16 0654  NA 139 139 135  --  137 138  K  5.1 3.2* 4.3  --  3.5 3.6  CL 103 104 100*  --  98* 98*  CO2 24 27 29   --  30 31  GLUCOSE 279* 97 285*  --  239* 236*  BUN 54* 47* 48*  --  42* 34*  CREATININE 2.05* 1.85* 1.70*  --  1.68* 1.46*  CALCIUM 8.7* 8.2* 7.9*  --  7.9* 8.0*  MG 2.4 2.2 2.1 2.0 1.9  --    Liver Function Tests: No results for input(s): AST, ALT, ALKPHOS, BILITOT, PROT, ALBUMIN in the last 168 hours. No results for input(s): LIPASE, AMYLASE in the last 168 hours. No results for input(s): AMMONIA in the last 168 hours. CBC:  Recent Labs Lab 11/25/16 0152 11/26/16 0600 11/27/16 0414 11/29/16 0607 11/30/16 0654  WBC 21.8* 12.5* 12.0* 10.8* 11.8*  NEUTROABS  --  10.5* 10.8*  --   --   HGB 9.7* 8.7* 8.5* 8.8* 9.0*  HCT 29.3* 27.4* 26.3* 27.3* 28.0*  MCV 83.0 87.0 84.0 84.0 84.1  PLT 225 149* 151 163 180   Cardiac Enzymes:  Recent Labs Lab 11/26/16 1044  TROPONINI <0.03   BNP: Invalid input(s): POCBNP CBG:  Recent Labs Lab 11/30/16 0744 11/30/16 1119 11/30/16 1725 11/30/16 2105 12/01/16 0800  GLUCAP 210* 290* 357* 214* 199*   D-Dimer No results for input(s): DDIMER in the last 72 hours. Hgb A1c No results for input(s): HGBA1C in the last 72 hours. Lipid Profile No results for input(s): CHOL, HDL, LDLCALC, TRIG, CHOLHDL, LDLDIRECT in the last 72 hours. Thyroid function studies No results for input(s): TSH, T4TOTAL, T3FREE, THYROIDAB in the last 72 hours.  Invalid input(s): FREET3 Anemia work up No results for input(s): VITAMINB12, FOLATE, FERRITIN, TIBC, IRON, RETICCTPCT in the last 72 hours. Urinalysis    Component Value Date/Time   COLORURINE YELLOW 11/24/2016 2031   APPEARANCEUR CLEAR 11/24/2016 2031   LABSPEC 1.013 11/24/2016 2031   PHURINE 5.0 11/24/2016 2031   GLUCOSEU 50 (A) 11/24/2016 2031   HGBUR NEGATIVE 11/24/2016 2031   BILIRUBINUR NEGATIVE 11/24/2016 2031   KETONESUR NEGATIVE 11/24/2016 2031   PROTEINUR NEGATIVE 11/24/2016 2031   NITRITE NEGATIVE 11/24/2016 2031    LEUKOCYTESUR NEGATIVE 11/24/2016 2031   Sepsis Labs Invalid input(s): PROCALCITONIN,  WBC,  LACTICIDVEN Microbiology Recent Results (from the past 240 hour(s))  MRSA PCR Screening     Status: None   Collection Time: 11/21/16  8:32 PM  Result Value Ref Range Status   MRSA by PCR NEGATIVE NEGATIVE Final    Comment:        The GeneXpert MRSA Assay (FDA approved for NASAL specimens only), is one component of a comprehensive MRSA colonization surveillance program. It is not intended to diagnose MRSA infection nor to guide or monitor treatment for MRSA infections.   Culture, Urine     Status: Abnormal   Collection Time: 11/24/16  8:31 PM  Result Value Ref Range Status   Specimen Description URINE, CLEAN CATCH  Final   Special Requests NONE  Final   Culture <10,000 COLONIES/mL INSIGNIFICANT GROWTH (A)  Final   Report Status 11/26/2016 FINAL  Final  Culture, blood (routine x 2)     Status: None   Collection Time: 11/24/16 10:31 PM  Result Value Ref Range Status   Specimen Description BLOOD RIGHT ANTECUBITAL  Final  Special Requests BOTTLES DRAWN AEROBIC AND ANAEROBIC 5CC  Final   Culture NO GROWTH 5 DAYS  Final   Report Status 11/30/2016 FINAL  Final  Culture, blood (routine x 2)     Status: None   Collection Time: 11/25/16  1:52 AM  Result Value Ref Range Status   Specimen Description BLOOD RIGHT ARM  Final   Special Requests IN PEDIATRIC BOTTLE  Final   Culture NO GROWTH 5 DAYS  Final   Report Status 11/30/2016 FINAL  Final  Culture, blood (Routine X 2) w Reflex to ID Panel     Status: None   Collection Time: 11/25/16  8:10 AM  Result Value Ref Range Status   Specimen Description BLOOD RIGHT HAND  Final   Special Requests IN PEDIATRIC BOTTLE 2CC  Final   Culture NO GROWTH 5 DAYS  Final   Report Status 11/30/2016 FINAL  Final  Culture, blood (Routine X 2) w Reflex to ID Panel     Status: None   Collection Time: 11/25/16  8:16 AM  Result Value Ref Range Status    Specimen Description BLOOD LEFT HAND  Final   Special Requests BOTTLES DRAWN AEROBIC ONLY 5CC  Final   Culture NO GROWTH 5 DAYS  Final   Report Status 11/30/2016 FINAL  Final     Time coordinating discharge: Over 30 minutes  SIGNED:  Noralee Stain, DO [facility] Pager [phone]  If 7PM-7AM, please contact night-coverage [email or web address] Password [facility] 12/01/2016, 9:38 AM

## 2016-12-01 NOTE — Clinical Social Work Note (Addendum)
CSW talked with patient's wife, Devin Heath regarding discharge plan as Devin Heath is ready for discharge today. Facility selection is [facility] in [place].   Devin Heath, Healthcare Liaison with [facility] came to hospital and talked with patient's wife and CSW regarding patient's possible d/c to their facility. She was advised that patient is ready for discharge today. CSW and wife later informed that they can take patient today.  Discharge clinicals transmitted to facility and patient will be transported by ambulance. Devin Heath contacted patient's daughter-in-law, Devin Heath by phone with wife and CSW present regarding discharge and completing admissions paperwork. Transport called for patient to be transported to facility - 2 pm transport.

## 2016-12-01 NOTE — Progress Notes (Signed)
Physical Therapy Treatment Patient Details Name: Devin Heath MRN: [id number] DOB: 24-Oct-1929 Today's Date: 12/01/2016    History of Present Illness pt presents with GIB, A-Fib, and Lactic Acidosis.  pt with overall decline over the course the the last "couple months" per wife.  pt with hx of Ischemic Cardiomyopathy, A-fib, CKD, COPD, CAD. Gout, DM, HF, RA, and RLS.      PT Comments    Progressing well with mobility, but quick to fatigue.  Emphasis on exercise, standing tolerance while getting clothes on in prep for transport to SNF and ambulation with a RW.  Follow Up Recommendations  SNF     Equipment Recommendations  None recommended by PT    Recommendations for Other Services       Precautions / Restrictions Precautions Precautions: Fall    Mobility  Bed Mobility Overal bed mobility: Needs Assistance Bed Mobility: Supine to Sit     Supine to sit: Mod assist;HOB elevated     General bed mobility comments: cuing for direction and truncal assist to help up via L elbow and stability while pt scoots forward.  Transfers Overall transfer level: Needs assistance Equipment used: Rolling walker (2 wheeled) Transfers: Sit to/from Stand Sit to Stand: Min assist Stand pivot transfers: Min assist       General transfer comment: cues for hand placement and stability assist more than lift assist  Ambulation/Gait Ambulation/Gait assistance: Min assist Ambulation Distance (Feet): 20 Feet (x2 with rest in between) Assistive device: Rolling walker (2 wheeled) Gait Pattern/deviations: Step-through pattern;Decreased stride length   Gait velocity interpretation: Below normal speed for age/gender General Gait Details: mildly unsteady overall, but safe with use of the RW   Stairs            Wheelchair Mobility    Modified Rankin (Stroke Patients Only)       Balance Overall balance assessment: Needs assistance Sitting-balance support: No upper extremity  supported Sitting balance-Leahy Scale: Fair Sitting balance - Comments: pt needed UE's to support for completing exercises   Standing balance support: During functional activity;Bilateral upper extremity supported Standing balance-Leahy Scale: Poor Standing balance comment: reliant on the RW                    Cognition Arousal/Alertness: Awake/alert Behavior During Therapy: Flat affect Overall Cognitive Status: Difficult to assess                      Exercises General Exercises - Lower Extremity Long Arc Quad: AROM;Strengthening;Both;10 reps;Seated Hip Flexion/Marching: AROM;Strengthening;Both;10 reps;Seated (graded resistance) Toe Raises: AROM;Strengthening;Both;15 reps;Seated Heel Raises: AROM;Strengthening;Both;15 reps;Seated    General Comments        Pertinent Vitals/Pain Pain Assessment: No/denies pain    Home Living                      Prior Function            PT Goals (current goals can now be found in the care plan section) Acute Rehab PT Goals Patient Stated Goal: Per wife for pt to return to normal. PT Goal Formulation: With patient/family Time For Goal Achievement: 12/10/16 Potential to Achieve Goals: Fair Progress towards PT goals: Progressing toward goals    Frequency    Min 2X/week      PT Plan Current plan remains appropriate    Co-evaluation             End of Session   Activity Tolerance: Patient tolerated  treatment well;Patient limited by fatigue Patient left: in chair;with call bell/phone within reach     Time: 1234-1258 PT Time Calculation (min) (ACUTE ONLY): 24 min  Charges:  $Gait Training: 8-22 mins $Therapeutic Exercise: 8-22 mins                    G Codes:      Eliseo Gum Alexandro Line 12/01/2016, 1:20 PM 12/01/2016  [place] Bing, PT [phone]  (pager)

## 2016-12-02 ENCOUNTER — Ambulatory Visit: Payer: Medicare Other | Admitting: Cardiology

## 2016-12-03 ENCOUNTER — Encounter (HOSPITAL_COMMUNITY): Payer: Medicare Other

## 2016-12-10 ENCOUNTER — Inpatient Hospital Stay (HOSPITAL_COMMUNITY)
Admission: EM | Admit: 2016-12-10 | Discharge: 2016-12-19 | DRG: 871 | Disposition: A | Discharge status: Hospice | Payer: Medicare Other | Attending: Family Medicine | Admitting: Family Medicine

## 2016-12-10 ENCOUNTER — Emergency Department (HOSPITAL_COMMUNITY): Payer: Medicare Other

## 2016-12-10 ENCOUNTER — Encounter (HOSPITAL_COMMUNITY): Payer: Self-pay

## 2016-12-10 DIAGNOSIS — Z79899 Other long term (current) drug therapy: Secondary | ICD-10-CM

## 2016-12-10 DIAGNOSIS — R509 Fever, unspecified: Secondary | ICD-10-CM

## 2016-12-10 DIAGNOSIS — I2510 Atherosclerotic heart disease of native coronary artery without angina pectoris: Secondary | ICD-10-CM | POA: Diagnosis not present

## 2016-12-10 DIAGNOSIS — J189 Pneumonia, unspecified organism: Secondary | ICD-10-CM | POA: Diagnosis present

## 2016-12-10 DIAGNOSIS — M1990 Unspecified osteoarthritis, unspecified site: Secondary | ICD-10-CM | POA: Diagnosis present

## 2016-12-10 DIAGNOSIS — Z87891 Personal history of nicotine dependence: Secondary | ICD-10-CM

## 2016-12-10 DIAGNOSIS — M069 Rheumatoid arthritis, unspecified: Secondary | ICD-10-CM | POA: Diagnosis present

## 2016-12-10 DIAGNOSIS — J9601 Acute respiratory failure with hypoxia: Secondary | ICD-10-CM | POA: Diagnosis present

## 2016-12-10 DIAGNOSIS — E1122 Type 2 diabetes mellitus with diabetic chronic kidney disease: Secondary | ICD-10-CM | POA: Diagnosis present

## 2016-12-10 DIAGNOSIS — N189 Chronic kidney disease, unspecified: Secondary | ICD-10-CM

## 2016-12-10 DIAGNOSIS — R0600 Dyspnea, unspecified: Secondary | ICD-10-CM

## 2016-12-10 DIAGNOSIS — I4891 Unspecified atrial fibrillation: Secondary | ICD-10-CM | POA: Diagnosis not present

## 2016-12-10 DIAGNOSIS — Z886 Allergy status to analgesic agent status: Secondary | ICD-10-CM

## 2016-12-10 DIAGNOSIS — R1313 Dysphagia, pharyngeal phase: Secondary | ICD-10-CM | POA: Diagnosis present

## 2016-12-10 DIAGNOSIS — Z881 Allergy status to other antibiotic agents status: Secondary | ICD-10-CM

## 2016-12-10 DIAGNOSIS — N289 Disorder of kidney and ureter, unspecified: Secondary | ICD-10-CM | POA: Diagnosis not present

## 2016-12-10 DIAGNOSIS — J440 Chronic obstructive pulmonary disease with acute lower respiratory infection: Secondary | ICD-10-CM | POA: Diagnosis present

## 2016-12-10 DIAGNOSIS — Z951 Presence of aortocoronary bypass graft: Secondary | ICD-10-CM | POA: Diagnosis not present

## 2016-12-10 DIAGNOSIS — I255 Ischemic cardiomyopathy: Secondary | ICD-10-CM | POA: Diagnosis present

## 2016-12-10 DIAGNOSIS — D638 Anemia in other chronic diseases classified elsewhere: Secondary | ICD-10-CM | POA: Diagnosis present

## 2016-12-10 DIAGNOSIS — Z515 Encounter for palliative care: Secondary | ICD-10-CM

## 2016-12-10 DIAGNOSIS — E118 Type 2 diabetes mellitus with unspecified complications: Secondary | ICD-10-CM

## 2016-12-10 DIAGNOSIS — H9190 Unspecified hearing loss, unspecified ear: Secondary | ICD-10-CM | POA: Diagnosis present

## 2016-12-10 DIAGNOSIS — I482 Chronic atrial fibrillation: Secondary | ICD-10-CM | POA: Diagnosis present

## 2016-12-10 DIAGNOSIS — Z6830 Body mass index (BMI) 30.0-30.9, adult: Secondary | ICD-10-CM

## 2016-12-10 DIAGNOSIS — Z888 Allergy status to other drugs, medicaments and biological substances status: Secondary | ICD-10-CM

## 2016-12-10 DIAGNOSIS — R1310 Dysphagia, unspecified: Secondary | ICD-10-CM | POA: Diagnosis present

## 2016-12-10 DIAGNOSIS — E876 Hypokalemia: Secondary | ICD-10-CM | POA: Diagnosis present

## 2016-12-10 DIAGNOSIS — I5033 Acute on chronic diastolic (congestive) heart failure: Secondary | ICD-10-CM | POA: Diagnosis present

## 2016-12-10 DIAGNOSIS — Z8249 Family history of ischemic heart disease and other diseases of the circulatory system: Secondary | ICD-10-CM

## 2016-12-10 DIAGNOSIS — I5021 Acute systolic (congestive) heart failure: Secondary | ICD-10-CM | POA: Diagnosis not present

## 2016-12-10 DIAGNOSIS — R52 Pain, unspecified: Secondary | ICD-10-CM

## 2016-12-10 DIAGNOSIS — Y95 Nosocomial condition: Secondary | ICD-10-CM | POA: Diagnosis present

## 2016-12-10 DIAGNOSIS — M109 Gout, unspecified: Secondary | ICD-10-CM | POA: Diagnosis present

## 2016-12-10 DIAGNOSIS — E870 Hyperosmolality and hypernatremia: Secondary | ICD-10-CM | POA: Diagnosis present

## 2016-12-10 DIAGNOSIS — Z9981 Dependence on supplemental oxygen: Secondary | ICD-10-CM | POA: Diagnosis not present

## 2016-12-10 DIAGNOSIS — E669 Obesity, unspecified: Secondary | ICD-10-CM | POA: Diagnosis present

## 2016-12-10 DIAGNOSIS — Z7189 Other specified counseling: Secondary | ICD-10-CM

## 2016-12-10 DIAGNOSIS — E1165 Type 2 diabetes mellitus with hyperglycemia: Secondary | ICD-10-CM | POA: Diagnosis present

## 2016-12-10 DIAGNOSIS — A419 Sepsis, unspecified organism: Principal | ICD-10-CM | POA: Diagnosis present

## 2016-12-10 DIAGNOSIS — E1121 Type 2 diabetes mellitus with diabetic nephropathy: Secondary | ICD-10-CM | POA: Diagnosis present

## 2016-12-10 DIAGNOSIS — Z452 Encounter for adjustment and management of vascular access device: Secondary | ICD-10-CM

## 2016-12-10 DIAGNOSIS — L8990 Pressure ulcer of unspecified site, unspecified stage: Secondary | ICD-10-CM | POA: Insufficient documentation

## 2016-12-10 DIAGNOSIS — G2581 Restless legs syndrome: Secondary | ICD-10-CM | POA: Diagnosis present

## 2016-12-10 DIAGNOSIS — K589 Irritable bowel syndrome without diarrhea: Secondary | ICD-10-CM | POA: Diagnosis present

## 2016-12-10 DIAGNOSIS — N179 Acute kidney failure, unspecified: Secondary | ICD-10-CM | POA: Diagnosis present

## 2016-12-10 DIAGNOSIS — D62 Acute posthemorrhagic anemia: Secondary | ICD-10-CM

## 2016-12-10 DIAGNOSIS — Z79891 Long term (current) use of opiate analgesic: Secondary | ICD-10-CM

## 2016-12-10 DIAGNOSIS — Z95828 Presence of other vascular implants and grafts: Secondary | ICD-10-CM

## 2016-12-10 DIAGNOSIS — I350 Nonrheumatic aortic (valve) stenosis: Secondary | ICD-10-CM | POA: Diagnosis present

## 2016-12-10 DIAGNOSIS — R059 Cough, unspecified: Secondary | ICD-10-CM

## 2016-12-10 DIAGNOSIS — N184 Chronic kidney disease, stage 4 (severe): Secondary | ICD-10-CM | POA: Diagnosis present

## 2016-12-10 DIAGNOSIS — I509 Heart failure, unspecified: Secondary | ICD-10-CM | POA: Diagnosis not present

## 2016-12-10 DIAGNOSIS — G4733 Obstructive sleep apnea (adult) (pediatric): Secondary | ICD-10-CM | POA: Diagnosis present

## 2016-12-10 DIAGNOSIS — E872 Acidosis, unspecified: Secondary | ICD-10-CM | POA: Diagnosis present

## 2016-12-10 DIAGNOSIS — R32 Unspecified urinary incontinence: Secondary | ICD-10-CM | POA: Diagnosis present

## 2016-12-10 DIAGNOSIS — E785 Hyperlipidemia, unspecified: Secondary | ICD-10-CM | POA: Diagnosis present

## 2016-12-10 DIAGNOSIS — Z66 Do not resuscitate: Secondary | ICD-10-CM | POA: Diagnosis present

## 2016-12-10 DIAGNOSIS — R05 Cough: Secondary | ICD-10-CM

## 2016-12-10 DIAGNOSIS — IMO0002 Reserved for concepts with insufficient information to code with codable children: Secondary | ICD-10-CM | POA: Diagnosis present

## 2016-12-10 DIAGNOSIS — Z9989 Dependence on other enabling machines and devices: Secondary | ICD-10-CM

## 2016-12-10 LAB — CBC WITH DIFFERENTIAL/PLATELET
BASOS PCT: 0 %
Basophils Absolute: 0 10*3/uL (ref 0.0–0.1)
EOS PCT: 1 %
Eosinophils Absolute: 0.1 10*3/uL (ref 0.0–0.7)
HCT: 26.1 % — ABNORMAL LOW (ref 39.0–52.0)
Hemoglobin: 7.8 g/dL — ABNORMAL LOW (ref 13.0–17.0)
Lymphocytes Relative: 11 %
Lymphs Abs: 1.1 10*3/uL (ref 0.7–4.0)
MCH: 26.2 pg (ref 26.0–34.0)
MCHC: 29.9 g/dL — AB (ref 30.0–36.0)
MCV: 87.6 fL (ref 78.0–100.0)
MONO ABS: 0.4 10*3/uL (ref 0.1–1.0)
Monocytes Relative: 4 %
Neutro Abs: 8.8 10*3/uL — ABNORMAL HIGH (ref 1.7–7.7)
Neutrophils Relative %: 84 %
PLATELETS: 152 10*3/uL (ref 150–400)
RBC: 2.98 MIL/uL — ABNORMAL LOW (ref 4.22–5.81)
RDW: 15.7 % — AB (ref 11.5–15.5)
WBC: 10.4 10*3/uL (ref 4.0–10.5)

## 2016-12-10 LAB — COMPREHENSIVE METABOLIC PANEL
ALK PHOS: 58 U/L (ref 38–126)
ALT: 13 U/L — AB (ref 17–63)
AST: 17 U/L (ref 15–41)
Albumin: 2.2 g/dL — ABNORMAL LOW (ref 3.5–5.0)
Anion gap: 11 (ref 5–15)
BUN: 28 mg/dL — AB (ref 6–20)
CHLORIDE: 106 mmol/L (ref 101–111)
CO2: 24 mmol/L (ref 22–32)
CREATININE: 1.95 mg/dL — AB (ref 0.61–1.24)
Calcium: 8.2 mg/dL — ABNORMAL LOW (ref 8.9–10.3)
GFR calc Af Amer: 34 mL/min — ABNORMAL LOW (ref >60)
GFR calc non Af Amer: 30 mL/min — ABNORMAL LOW (ref >60)
GLUCOSE: 149 mg/dL — AB (ref 65–99)
Potassium: 4.3 mmol/L (ref 3.5–5.1)
Sodium: 141 mmol/L (ref 135–145)
Total Bilirubin: 0.3 mg/dL (ref 0.3–1.2)
Total Protein: 5.5 g/dL — ABNORMAL LOW (ref 6.5–8.1)

## 2016-12-10 LAB — URINALYSIS, ROUTINE W REFLEX MICROSCOPIC
Bilirubin Urine: NEGATIVE
Glucose, UA: NEGATIVE mg/dL
Hgb urine dipstick: NEGATIVE
Ketones, ur: NEGATIVE mg/dL
Nitrite: NEGATIVE
Protein, ur: NEGATIVE mg/dL
Specific Gravity, Urine: 1.012 (ref 1.005–1.030)
Squamous Epithelial / HPF: NONE SEEN
pH: 5 (ref 5.0–8.0)

## 2016-12-10 LAB — I-STAT TROPONIN, ED: Troponin i, poc: 0.01 ng/mL (ref 0.00–0.08)

## 2016-12-10 LAB — I-STAT CG4 LACTIC ACID, ED
Lactic Acid, Venous: 1.61 mmol/L (ref 0.5–1.9)
Lactic Acid, Venous: 2.46 mmol/L (ref 0.5–1.9)

## 2016-12-10 LAB — MAGNESIUM: MAGNESIUM: 1.6 mg/dL — AB (ref 1.7–2.4)

## 2016-12-10 LAB — PHOSPHORUS: PHOSPHORUS: 3.5 mg/dL (ref 2.5–4.6)

## 2016-12-10 MED ORDER — SODIUM CHLORIDE 0.9 % IV BOLUS (SEPSIS)
1000.0000 mL | Freq: Once | INTRAVENOUS | Status: AC
  Administered 2016-12-10: 1000 mL via INTRAVENOUS

## 2016-12-10 MED ORDER — PANTOPRAZOLE SODIUM 40 MG IV SOLR
40.0000 mg | Freq: Once | INTRAVENOUS | Status: AC
  Administered 2016-12-10: 40 mg via INTRAVENOUS
  Filled 2016-12-10: qty 40

## 2016-12-10 MED ORDER — VANCOMYCIN HCL IN DEXTROSE 1-5 GM/200ML-% IV SOLN
1000.0000 mg | Freq: Once | INTRAVENOUS | Status: AC
  Administered 2016-12-10: 1000 mg via INTRAVENOUS
  Filled 2016-12-10: qty 200

## 2016-12-10 MED ORDER — PIPERACILLIN-TAZOBACTAM 3.375 G IVPB
3.3750 g | Freq: Three times a day (TID) | INTRAVENOUS | Status: AC
  Administered 2016-12-11 – 2016-12-17 (×21): 3.375 g via INTRAVENOUS
  Filled 2016-12-10 (×22): qty 50

## 2016-12-10 MED ORDER — PIPERACILLIN-TAZOBACTAM 3.375 G IVPB 30 MIN
3.3750 g | Freq: Once | INTRAVENOUS | Status: AC
  Administered 2016-12-10: 3.375 g via INTRAVENOUS
  Filled 2016-12-10: qty 50

## 2016-12-10 MED ORDER — VANCOMYCIN HCL 10 G IV SOLR
1250.0000 mg | INTRAVENOUS | Status: DC
  Filled 2016-12-10: qty 1250

## 2016-12-10 NOTE — Progress Notes (Signed)
Pharmacy Antibiotic Note  Devin Heath is a 83 y.o. male admitted on 12/10/2016 with sepsis.  Pharmacy has been consulted for vancomycin and zosyn dosing.  Patient received vancomycin 1g and zosyn 3.375g IV once in the ED.  Plan: Vancomycin 1250mg  IV every 24 hours.  Goal trough 15-20 mcg/mL. Zosyn 3.375g IV q8h (4 hour infusion).  Monitor culture data, renal function and clinical course VT at SS prn  Height: 5\' 7"  (170.2 cm) Weight: 195 lb (88.5 kg) IBW/kg (Calculated) : 66.1  Temp (24hrs), Avg:98.4 F (36.9 C), Min:98.4 F (36.9 C), Max:98.4 F (36.9 C)  No results for input(s): WBC, CREATININE, LATICACIDVEN, VANCOTROUGH, VANCOPEAK, VANCORANDOM, GENTTROUGH, GENTPEAK, GENTRANDOM, TOBRATROUGH, TOBRAPEAK, TOBRARND, AMIKACINPEAK, AMIKACINTROU, AMIKACIN in the last 168 hours.  Estimated Creatinine Clearance: 39.3 mL/min (by C-G formula based on SCr of 1.46 mg/dL (H)).    Allergies  Allergen Reactions  . Ace Inhibitors Cough  . Ambrisentan Other (See Comments)  . Clindamycin/Lincomycin Other (See Comments)  . Erythromycin Other (See Comments)  . Feldene [Piroxicam] Other (See Comments)  . Keflex [Cephalexin] Other (See Comments)  . Omnicef [Cefdinir] Other (See Comments)  . Simvastatin Other (See Comments)     . , PharmD, BCPS Clinical Pharmacist Pager [phone] 12/10/2016 7:47 PM

## 2016-12-10 NOTE — ED Notes (Signed)
Delay in lab draw pt in CT

## 2016-12-10 NOTE — H&P (Signed)
History and Physical    Cayleb Jarnigan ZOX:[id number] DOB: 14-Aug-1929 DOA: 12/10/2016  PCP: [name], MD   Patient coming from: Home.  Chief Complaint: AMS  HPI: Devin Heath is a 83 y.o. male with medical history significant of A. fib, osteoarthritis, CAD, diastolic dysfunction, COPD, type 2 diabetes, gout, hyperlipidemia, IBS, obstructive sleep apnea on CPAP, renal insufficiency, rheumatoid arthritis, restless leg syndrome who was recently discharged from the hospital due to GI bleed secondary to anticoagulation for atrial fibrillation who is being brought from his nursing home facility for evaluation of altered mental status and fever for the past 2 days. The patient was receiving ceftriaxone through [facility] at the [facility]. He is currently confused and unable to provide further history.   ED Course: The patient received 2 L of normal saline bolus, vancomycin and Zosyn in the ED. Work up shows WBC of 10.4, hemoglobin level of 7.8 g/dL, platelet of 811. His chemistry showed BUN 28, creatinine 1.95 and glucose 149 mg/dL. His second lactic acid level was 2.46 mmol/L. his urine analysis did not show signs of UTI, but his chest radiograph showed right lower quadrant infiltrate.  Review of Systems: As per HPI otherwise 10 point review of systems negative.    Past Medical History:  Diagnosis Date  . A-fib (HCC)   . Arthritis   . CAD (coronary artery disease)   . COPD (chronic obstructive pulmonary disease) (HCC)   . Diabetes (HCC)   . Former smoker   . Gout   . Hard of hearing   . Heart failure (HCC)   . Hyperlipidemia   . IBS (irritable bowel syndrome)   . Ischemic cardiomyopathy   . On home oxygen therapy    "2L q hs" (10/13/2016)  . OSA on CPAP   . Renal insufficiency   . Rheumatoid arthritis (HCC)   . RLS (restless legs syndrome)   . Vitamin D deficiency     Past Surgical History:  Procedure Laterality Date  . APPENDECTOMY    . CARDIAC SURGERY     BY PASS  .  COLONOSCOPY WITH PROPOFOL N/A 11/25/2016   Procedure: COLONOSCOPY WITH PROPOFOL;  Surgeon: Iva Boop, MD;  Location: [facility] ENDOSCOPY;  Service: Endoscopy;  Laterality: N/A;  . ESOPHAGOGASTRODUODENOSCOPY N/A 11/24/2016   Procedure: ESOPHAGOGASTRODUODENOSCOPY (EGD);  Surgeon: Iva Boop, MD;  Location: [facility] ENDOSCOPY;  Service: Endoscopy;  Laterality: N/A;  . FINGER AMPUTATION Left    RING FINGER  . GIVENS CAPSULE STUDY N/A 11/25/2016   Procedure: GIVENS CAPSULE STUDY;  Surgeon: Iva Boop, MD;  Location: [facility] ENDOSCOPY;  Service: Endoscopy;  Laterality: N/A;  . NECK SURGERY    . TONSILLECTOMY       reports that he has quit smoking. He has never used smokeless tobacco. He reports that he does not drink alcohol or use drugs.  Allergies  Allergen Reactions  . Ace Inhibitors Cough  . Ambrisentan Other (See Comments)  . Clindamycin/Lincomycin Other (See Comments)  . Erythromycin Other (See Comments)  . Feldene [Piroxicam] Other (See Comments)  . Keflex [Cephalexin] Other (See Comments)  . Omnicef [Cefdinir] Other (See Comments)  . Simvastatin Other (See Comments)    Family History  Problem Relation Age of Onset  . CAD Mother   . CAD Father     Prior to Admission medications   Medication Sig Start Date End Date Taking? Authorizing Provider  acetaminophen (TYLENOL) 500 MG tablet Take 500 mg by mouth every 6 (six) hours as needed for  moderate pain.     Historical Provider, MD  albuterol (PROVENTIL HFA;VENTOLIN HFA) 108 (90 Base) MCG/ACT inhaler Inhale 2 puffs into the lungs every 6 (six) hours as needed for wheezing or shortness of breath.    Historical Provider, MD  allopurinol (ZYLOPRIM) 100 MG tablet Take 100 mg by mouth daily.    Historical Provider, MD  carvedilol (COREG) 25 MG tablet Take 25 mg by mouth 2 (two) times daily with a meal.    Historical Provider, MD  cetirizine (ZYRTEC) 5 MG tablet Take 5 mg by mouth daily as needed for allergies.    Historical Provider, MD    Cholecalciferol (VITAMIN D3) 1000 units CAPS Take 2,000 Units by mouth daily.     Historical Provider, MD  cholestyramine (QUESTRAN) 4 g packet Take 4 g by mouth 2 (two) times daily.    Historical Provider, MD  Coenzyme Q10 (CO Q-10) 100 MG CAPS Take 100 mg by mouth daily.     Historical Provider, MD  diphenhydrAMINE (ALLERGY RELIEF) 25 mg capsule Take 25 mg by mouth every 6 (six) hours as needed for itching or allergies.     Historical Provider, MD  HYDROcodone-acetaminophen (NORCO/VICODIN) 5-325 MG tablet Take 1 tablet by mouth every 8 (eight) hours as needed for moderate pain or severe pain. 12/01/16   Jennifer Chahn-Yang Choi, DO  Insulin Glargine (TOUJEO SOLOSTAR) 300 UNIT/ML SOPN Inject 40 Units into the skin at bedtime.    Historical Provider, MD  insulin glulisine (APIDRA) 100 UNIT/ML injection Inject 14 Units into the skin 3 (three) times daily before meals.    Historical Provider, MD  mirtazapine (REMERON) 15 MG tablet Take 15 mg by mouth at bedtime.    Historical Provider, MD  omeprazole (PRILOSEC) 40 MG capsule Take 1 capsule (40 mg total) by mouth daily. 12/01/16   Carlton Adam Choi, DO  pravastatin (PRAVACHOL) 20 MG tablet Take 20 mg by mouth daily.    Historical Provider, MD  predniSONE (DELTASONE) 10 MG tablet Take 15 mg by mouth daily with breakfast.    Historical Provider, MD  pregabalin (LYRICA) 75 MG capsule Take 1 capsule (75 mg total) by mouth 2 (two) times daily. 12/01/16   Jennifer Chahn-Yang Choi, DO  promethazine (PHENERGAN) 25 MG tablet Take 25 mg by mouth every 6 (six) hours as needed for nausea or vomiting.    Historical Provider, MD  terazosin (HYTRIN) 2 MG capsule Take 2 mg by mouth at bedtime.    Historical Provider, MD  tiotropium (SPIRIVA) 18 MCG inhalation capsule Place 18 mcg into inhaler and inhale daily.    Historical Provider, MD  torsemide (DEMADEX) 20 MG tablet Take 1 tablet (20 mg total) by mouth daily. 12/01/16   Jennifer Chahn-Yang Choi, DO  umeclidinium  bromide (INCRUSE ELLIPTA) 62.5 MCG/INH AEPB Inhale 1 puff into the lungs daily.    Historical Provider, MD    Physical Exam:  Constitutional: Looks acutely ill. Vitals:   12/10/16 2200 12/10/16 2215 12/10/16 2230 12/10/16 2300  BP: 106/89 98/61 110/84 115/97  Pulse: (!) 128 112 (!) 132 (!) 142  Resp: 20 20 22 22   Temp:      TempSrc:      SpO2: 100% 100% 100% 100%  Weight:      Height:       Eyes: PERRL, lids and conjunctivae normal ENMT: Mucous membranes are dry. Posterior pharynx clear of any exudate or lesions.  Neck: normal, supple, no masses, no thyromegaly Respiratory: Decreased breath sounds on bases, no  wheezing, no crackles. Normal respiratory effort. No accessory muscle use.  Cardiovascular: Tachycardic, irregularly irregular at 116 BPM, no murmurs / rubs / gallops. 1+ lower extremity edema. 2+ pedal pulses. No carotid bruits.  Abdomen: Soft, no tenderness, no masses palpated. No hepatosplenomegaly. Bowel sounds positive.  Musculoskeletal: no clubbing / cyanosis. Good passive ROM, no contractures. Normal muscle tone.  Skin: Multiple areas of ecchymosis on extremities. Unable to evaluate patient's dorso. Neurologic: Moves all extremities, but unable to follow commands. Psychiatric: Obtunded.   Labs on Admission: I have personally reviewed following labs and imaging studies  CBC:  Recent Labs Lab 12/10/16 2026  WBC 10.4  NEUTROABS 8.8*  HGB 7.8*  HCT 26.1*  MCV 87.6  PLT 152   Basic Metabolic Panel:  Recent Labs Lab 12/10/16 2026  NA 141  K 4.3  CL 106  CO2 24  GLUCOSE 149*  BUN 28*  CREATININE 1.95*  CALCIUM 8.2*   GFR: Estimated Creatinine Clearance: 29.4 mL/min (by C-G formula based on SCr of 1.95 mg/dL (H)). Liver Function Tests:  Recent Labs Lab 12/10/16 2026  AST 17  ALT 13*  ALKPHOS 58  BILITOT 0.3  PROT 5.5*  ALBUMIN 2.2*   No results for input(s): LIPASE, AMYLASE in the last 168 hours. No results for input(s): AMMONIA in the last  168 hours. Coagulation Profile: No results for input(s): INR, PROTIME in the last 168 hours. Cardiac Enzymes: No results for input(s): CKTOTAL, CKMB, CKMBINDEX, TROPONINI in the last 168 hours. BNP (last 3 results) No results for input(s): PROBNP in the last 8760 hours. HbA1C: No results for input(s): HGBA1C in the last 72 hours. CBG: No results for input(s): GLUCAP in the last 168 hours. Lipid Profile: No results for input(s): CHOL, HDL, LDLCALC, TRIG, CHOLHDL, LDLDIRECT in the last 72 hours. Thyroid Function Tests: No results for input(s): TSH, T4TOTAL, FREET4, T3FREE, THYROIDAB in the last 72 hours. Anemia Panel: No results for input(s): VITAMINB12, FOLATE, FERRITIN, TIBC, IRON, RETICCTPCT in the last 72 hours. Urine analysis:    Component Value Date/Time   COLORURINE YELLOW 12/10/2016 2137   APPEARANCEUR HAZY (A) 12/10/2016 2137   LABSPEC 1.012 12/10/2016 2137   PHURINE 5.0 12/10/2016 2137   GLUCOSEU NEGATIVE 12/10/2016 2137   HGBUR NEGATIVE 12/10/2016 2137   BILIRUBINUR NEGATIVE 12/10/2016 2137   KETONESUR NEGATIVE 12/10/2016 2137   PROTEINUR NEGATIVE 12/10/2016 2137   NITRITE NEGATIVE 12/10/2016 2137   LEUKOCYTESUR TRACE (A) 12/10/2016 2137    Radiological Exams on Admission: Ct Head Wo Contrast  Result Date: 12/10/2016 CLINICAL DATA:  Fever and hypotension, slurred speech, suspect sepsis. Assess for CVA. EXAM: CT HEAD WITHOUT CONTRAST TECHNIQUE: Contiguous axial images were obtained from the base of the skull through the vertex without intravenous contrast. COMPARISON:  None. FINDINGS: Brain: There is generalized age related parenchymal atrophy with commensurate dilatation of the ventricles and sulci. Mild chronic small vessel ischemic changes noted within the deep periventricular white matter regions bilaterally. There is no mass, hemorrhage, edema or other evidence of acute parenchymal abnormality. No extra-axial hemorrhage. Vascular: There are chronic calcified  atherosclerotic changes of the large vessels at the skull base. No unexpected hyperdense vessel. Skull: Normal. Negative for fracture or focal lesion. Sinuses/Orbits: Extensive chronic appearing mucosal thickening throughout the bilateral maxillary sinuses. Orbital/periorbital soft tissues are unremarkable. Other: None. IMPRESSION: 1. No acute findings.  No intracranial mass, hemorrhage or edema. 2. Chronic appearing paranasal sinus disease. Electronically Signed   By: Bary Richard M.D.   On: 12/10/2016 21:23  Dg Chest Portable 1 View  Result Date: 12/10/2016 CLINICAL DATA:  Fever EXAM: PORTABLE CHEST 1 VIEW COMPARISON:  11/25/2016 FINDINGS: Median sternotomy wires are again evident. A right upper extremity catheter tip is difficult to visualize, catheter tubing is seen to the level of SVC. Mild atelectasis or infiltrate at the right lung base. No effusion. Stable cardiomediastinal silhouette. No pneumothorax. IMPRESSION: Mild hazy atelectasis or infiltrate at the right infrahilar lung. Stable borderline to mild cardiomegaly without overt failure. Electronically Signed   By: Jasmine Pang M.D.   On: 12/10/2016 23:18  Echocardiogram 10/14/2016.  ------------------------------------------------------------------- LV EF: 60% -   65%  ------------------------------------------------------------------- Indications:      Cardiomyopathy - ischemic 414.8.  ------------------------------------------------------------------- History:   PMH:   Atrial fibrillation.  Coronary artery disease. Chronic obstructive pulmonary disease.  Risk factors:  Diabetes mellitus. Dyslipidemia.  ------------------------------------------------------------------- Study Conclusions  - Left ventricle: The cavity size was normal. Wall thickness was   normal. Systolic function was normal. The estimated ejection   fraction was in the range of 60% to 65%. Wall motion was normal;   there were no regional wall motion  abnormalities. - Aortic valve: Mildly calcified annulus. Mildly thickened   leaflets. There was trivial regurgitation. - Mitral valve: There was mild regurgitation. - Pulmonary arteries: Systolic pressure was mildly increased. PA   peak pressure: 31 mm Hg (S).   Assessment/Plan Principal Problem:   HCAP (healthcare-associated pneumonia) Admit to stepdown/inpatient. Continue gentle IV hydration. Continue supplemental oxygen. Continue vancomycin per pharmacy. Continue Zosyn per pharmacy. Follow-up blood cultures and sensitivity. Prognosis is guarded.  Active Problems:   Lactic acidosis Candidate to above. Continue treatment for pneumonia. Follow-up lactic acid level on next blood draw.    Atrial fibrillation with rapid ventricular response (HCC) CHA2DS2-VASc Score of at least 4 Will give mild sedation and put the patient on CPAP Magnesium has been corrected. Resume Coreg if blood pressure measurements allow.    Acute on chronic renal insufficiency Gentle IV hydration. Follow-up BUN and creatinine.    Hyperlipidemia Hold statin for now Monitor LFTs periodically.    CAD (coronary artery disease)  Not on antiplatelet agent or anticoagulation due to recent GI bleed.  Resume corrected once the blood pressure allows it.    OSA on CPAP Continue nocturnal CPAP.      Uncontrolled type 2 diabetes mellitus with complication (HCC) Carbohydrate modified diet CBG monitoring with regular insulin sliding scale.    Anemia Will do serial hemoglobin levels. Transfuse as needed.    DVT prophylaxis: SCDs. Code Status: Full code. Family Communication: His wife and daughter-in-law were present in the room Disposition Plan: Admit to stepdown for IV antibiotic therapy and further evaluation. Consults called:  Admission status: Inpatient/stepdown.   Bobette Mo MD [facility] Pager [phone]  If 7PM-7AM, please contact night-coverage [email or web address] Password  [facility]  12/10/2016, 11:48 PM

## 2016-12-10 NOTE — ED Provider Notes (Signed)
[facility]-EMERGENCY DEPT Provider Note  CSN: [id number] Arrival date & time: 12/10/16  1912  History   Chief Complaint Chief Complaint  Patient presents with  . Altered Mental Status   HPI Devin Heath is a 83 y.o. male.  The history is provided by a relative, the EMS personnel and medical records. The history is limited by the condition of the patient. No language interpreter was used.  Illness  The current episode started 12 to 24 hours ago. The problem occurs constantly. The problem has been gradually worsening. Nothing aggravates the symptoms. Nothing relieves the symptoms.    Past Medical History:  Diagnosis Date  . A-fib (HCC)   . Arthritis   . CAD (coronary artery disease)   . COPD (chronic obstructive pulmonary disease) (HCC)   . Diabetes (HCC)   . Former smoker   . Gout   . Hard of hearing   . Heart failure (HCC)   . Hyperlipidemia   . IBS (irritable bowel syndrome)   . Ischemic cardiomyopathy   . On home oxygen therapy    "2L q hs" (10/13/2016)  . OSA on CPAP   . Renal insufficiency   . Rheumatoid arthritis (HCC)   . RLS (restless legs syndrome)   . Vitamin D deficiency    Patient Active Problem List   Diagnosis Date Noted  . HCAP (healthcare-associated pneumonia) 12/10/2016  . Shingles ([place]) 11/28/2016  . CAD in native artery   . Upper GI bleed   . Neck pain   . Ischemic dilated cardiomyopathy (HCC)   . DISH (diffuse idiopathic skeletal hyperostosis)   . Atrial fibrillation with RVR (HCC)   . Chronic diastolic CHF (congestive heart failure) (HCC)   . Acute renal failure superimposed on stage 4 chronic kidney disease (HCC)   . Uncontrolled type 2 diabetes mellitus with complication (HCC)   . Type 2 diabetes mellitus with ketoacidotic coma, without long-term current use of insulin   . Chronic anticoagulation   . Lactic acidosis 11/21/2016  . GIB (gastrointestinal bleeding) 11/21/2016  . Melena   . Acute blood loss anemia   . Coronary artery  disease involving coronary bypass graft of native heart without angina pectoris   . Hypokalemia 10/13/2016  . Chest pain with high risk for cardiac etiology 10/13/2016  . Abnormal EKG 10/13/2016  . Hyperglycemia 10/13/2016  . Leukocytosis 10/13/2016  . Acute on chronic renal insufficiency   . OSA (obstructive sleep apnea)   . Ischemic cardiomyopathy   . IBS (irritable bowel syndrome)   . Hyperlipidemia   . Heart failure (HCC)   . Diabetes (HCC)   . Chronic obstructive pulmonary disease (HCC)   . CAD (coronary artery disease)   . Rheumatoid arthritis (HCC)   . Atrial fibrillation with rapid ventricular response (HCC)   . OSA on CPAP   . RLS (restless legs syndrome)   . Vitamin D deficiency   . Gout   . Hard of hearing   . Former smoker    Past Surgical History:  Procedure Laterality Date  . APPENDECTOMY    . CARDIAC SURGERY     BY PASS  . COLONOSCOPY WITH PROPOFOL N/A 11/25/2016   Procedure: COLONOSCOPY WITH PROPOFOL;  Surgeon: Iva Boop, MD;  Location[facility] ENDOSCOPY;  Service: Endoscopy;  Laterality: N/A;  . ESOPHAGOGASTRODUODENOSCOPY N/A 11/24/2016   Procedure: ESOPHAGOGASTRODUODENOSCOPY (EGD);  Surgeon: Iva Boop, MD;  Location: [facility] ENDOSCOPY;  Service: Endoscopy;  Laterality: N/A;  . FINGER AMPUTATION Left  RING FINGER  . GIVENS CAPSULE STUDY N/A 11/25/2016   Procedure: GIVENS CAPSULE STUDY;  Surgeon: Iva Boop, MD;  Location: [facility] ENDOSCOPY;  Service: Endoscopy;  Laterality: N/A;  . NECK SURGERY    . TONSILLECTOMY      Home Medications    Prior to Admission medications   Medication Sig Start Date End Date Taking? Authorizing Provider  valACYclovir (VALTREX) 1000 MG tablet Take 1,000 mg by mouth 2 (two) times daily.   Yes Historical Provider, MD  acetaminophen (TYLENOL) 500 MG tablet Take 500 mg by mouth every 6 (six) hours as needed for moderate pain.     Historical Provider, MD  albuterol (PROVENTIL HFA;VENTOLIN HFA) 108 (90 Base) MCG/ACT inhaler Inhale 2  puffs into the lungs every 6 (six) hours as needed for wheezing or shortness of breath.    Historical Provider, MD  allopurinol (ZYLOPRIM) 100 MG tablet Take 100 mg by mouth daily.    Historical Provider, MD  carvedilol (COREG) 25 MG tablet Take 25 mg by mouth 2 (two) times daily with a meal.    Historical Provider, MD  cetirizine (ZYRTEC) 5 MG tablet Take 5 mg by mouth daily as needed for allergies.    Historical Provider, MD  Cholecalciferol (VITAMIN D3) 1000 units CAPS Take 2,000 Units by mouth daily.     Historical Provider, MD  cholestyramine (QUESTRAN) 4 g packet Take 4 g by mouth 2 (two) times daily.    Historical Provider, MD  Coenzyme Q10 (CO Q-10) 100 MG CAPS Take 100 mg by mouth daily.     Historical Provider, MD  diphenhydrAMINE (ALLERGY RELIEF) 25 mg capsule Take 25 mg by mouth every 6 (six) hours as needed for itching or allergies.     Historical Provider, MD  HYDROcodone-acetaminophen (NORCO/VICODIN) 5-325 MG tablet Take 1 tablet by mouth every 8 (eight) hours as needed for moderate pain or severe pain. 12/01/16   Jennifer Chahn-Yang Choi, DO  Insulin Glargine (TOUJEO SOLOSTAR) 300 UNIT/ML SOPN Inject 40 Units into the skin at bedtime.    Historical Provider, MD  insulin glulisine (APIDRA) 100 UNIT/ML injection Inject 14 Units into the skin 3 (three) times daily before meals.    Historical Provider, MD  mirtazapine (REMERON) 15 MG tablet Take 15 mg by mouth at bedtime.    Historical Provider, MD  omeprazole (PRILOSEC) 40 MG capsule Take 1 capsule (40 mg total) by mouth daily. 12/01/16   Carlton Adam Choi, DO  pravastatin (PRAVACHOL) 20 MG tablet Take 20 mg by mouth daily.    Historical Provider, MD  predniSONE (DELTASONE) 10 MG tablet Take 15 mg by mouth daily with breakfast.    Historical Provider, MD  pregabalin (LYRICA) 75 MG capsule Take 1 capsule (75 mg total) by mouth 2 (two) times daily. 12/01/16   Jennifer Chahn-Yang Choi, DO  promethazine (PHENERGAN) 25 MG tablet Take 25 mg  by mouth every 6 (six) hours as needed for nausea or vomiting.    Historical Provider, MD  terazosin (HYTRIN) 2 MG capsule Take 2 mg by mouth at bedtime.    Historical Provider, MD  tiotropium (SPIRIVA) 18 MCG inhalation capsule Place 18 mcg into inhaler and inhale daily.    Historical Provider, MD  torsemide (DEMADEX) 20 MG tablet Take 1 tablet (20 mg total) by mouth daily. 12/01/16   Jennifer Chahn-Yang Choi, DO  umeclidinium bromide (INCRUSE ELLIPTA) 62.5 MCG/INH AEPB Inhale 1 puff into the lungs daily.    Historical Provider, MD   Family History Family  History  Problem Relation Age of Onset  . CAD Mother   . CAD Father    Social History Social History  Substance Use Topics  . Smoking status: Former Games developer  . Smokeless tobacco: Never Used  . Alcohol use No    Allergies   Ace inhibitors; Ambrisentan; Clindamycin/lincomycin; Erythromycin; Feldene [piroxicam]; Keflex [cephalexin]; Omnicef [cefdinir]; and Simvastatin   Review of Systems Review of Systems  Unable to perform ROS: Mental status change  Constitutional: Positive for activity change (decreased), appetite change (decreased) and fever.  Neurological: Positive for speech difficulty.    Physical Exam Updated Vital Signs BP 115/97   Pulse (!) 142   Temp 98.4 F (36.9 C) (Oral)   Resp 22   Ht 5\' 7"  (1.702 m)   Wt 88.5 kg   SpO2 100%   BMI 30.54 kg/m   Physical Exam  Constitutional: No distress.  Overweight elderly Caucasian male  HENT:  Head: Normocephalic and atraumatic.  Eyes: EOM are normal. Pupils are equal, round, and reactive to light.  Neck: Normal range of motion. Neck supple.  Cardiovascular: Regular rhythm and normal heart sounds.  Tachycardia present.   SPB low 100's  Pulmonary/Chest: No respiratory distress. He has no wheezes. He has no rales.  Mild tachypnea to low 20s, no increased work of breathing, coarse breath sounds at bases bilaterally, maintaining sats on room air  Abdominal: Soft. Bowel  sounds are normal. He exhibits distension. There is no tenderness.  Musculoskeletal: Normal range of motion. He exhibits edema.  Neurological: He is alert.  Oriented to self  Skin: Skin is warm and dry. Capillary refill takes less than 2 seconds. He is not diaphoretic.  PICC line in place to right upper extremity without evidence of surrounding warmth or erythema  Nursing note and vitals reviewed.   ED Treatments / Results  Labs (all labs ordered are listed, but only abnormal results are displayed) Labs Reviewed  COMPREHENSIVE METABOLIC PANEL - Abnormal; Notable for the following:       Result Value   Glucose, Bld 149 (*)    BUN 28 (*)    Creatinine, Ser 1.95 (*)    Calcium 8.2 (*)    Total Protein 5.5 (*)    Albumin 2.2 (*)    ALT 13 (*)    GFR calc non Af Amer 30 (*)    GFR calc Af Amer 34 (*)    All other components within normal limits  CBC WITH DIFFERENTIAL/PLATELET - Abnormal; Notable for the following:    RBC 2.98 (*)    Hemoglobin 7.8 (*)    HCT 26.1 (*)    MCHC 29.9 (*)    RDW 15.7 (*)    Neutro Abs 8.8 (*)    All other components within normal limits  URINALYSIS, ROUTINE W REFLEX MICROSCOPIC - Abnormal; Notable for the following:    APPearance HAZY (*)    Leukocytes, UA TRACE (*)    Bacteria, UA RARE (*)    All other components within normal limits  MAGNESIUM - Abnormal; Notable for the following:    Magnesium 1.6 (*)    All other components within normal limits  I-STAT CG4 LACTIC ACID, ED - Abnormal; Notable for the following:    Lactic Acid, Venous 2.46 (*)    All other components within normal limits  CULTURE, BLOOD (ROUTINE X 2)  CULTURE, BLOOD (ROUTINE X 2)  URINE CULTURE  RESPIRATORY PANEL BY PCR  PHOSPHORUS  AMMONIA  I-STAT CG4 LACTIC ACID, ED  Rosezena Sensor, ED  POC OCCULT BLOOD, ED  TYPE AND SCREEN   EKG  EKG Interpretation None      Radiology Ct Head Wo Contrast  Result Date: 12/10/2016 CLINICAL DATA:  Fever and hypotension,  slurred speech, suspect sepsis. Assess for CVA. EXAM: CT HEAD WITHOUT CONTRAST TECHNIQUE: Contiguous axial images were obtained from the base of the skull through the vertex without intravenous contrast. COMPARISON:  None. FINDINGS: Brain: There is generalized age related parenchymal atrophy with commensurate dilatation of the ventricles and sulci. Mild chronic small vessel ischemic changes noted within the deep periventricular white matter regions bilaterally. There is no mass, hemorrhage, edema or other evidence of acute parenchymal abnormality. No extra-axial hemorrhage. Vascular: There are chronic calcified atherosclerotic changes of the large vessels at the skull base. No unexpected hyperdense vessel. Skull: Normal. Negative for fracture or focal lesion. Sinuses/Orbits: Extensive chronic appearing mucosal thickening throughout the bilateral maxillary sinuses. Orbital/periorbital soft tissues are unremarkable. Other: None. IMPRESSION: 1. No acute findings.  No intracranial mass, hemorrhage or edema. 2. Chronic appearing paranasal sinus disease. Electronically Signed   By: Bary Richard M.D.   On: 12/10/2016 21:23   Dg Chest Portable 1 View  Result Date: 12/10/2016 CLINICAL DATA:  Fever EXAM: PORTABLE CHEST 1 VIEW COMPARISON:  11/25/2016 FINDINGS: Median sternotomy wires are again evident. A right upper extremity catheter tip is difficult to visualize, catheter tubing is seen to the level of SVC. Mild atelectasis or infiltrate at the right lung base. No effusion. Stable cardiomediastinal silhouette. No pneumothorax. IMPRESSION: Mild hazy atelectasis or infiltrate at the right infrahilar lung. Stable borderline to mild cardiomegaly without overt failure. Electronically Signed   By: Jasmine Pang M.D.   On: 12/10/2016 23:18   Procedures Procedures (including critical care time)  Medications Ordered in ED Medications  vancomycin (VANCOCIN) 1,250 mg in sodium chloride 0.9 % 250 mL IVPB (not administered)    piperacillin-tazobactam (ZOSYN) IVPB 3.375 g (not administered)  magnesium sulfate IVPB 4 g 100 mL (not administered)  LORazepam (ATIVAN) injection 0.5 mg (not administered)  piperacillin-tazobactam (ZOSYN) IVPB 3.375 g (0 g Intravenous Stopped 12/10/16 2100)  vancomycin (VANCOCIN) IVPB 1000 mg/200 mL premix (0 mg Intravenous Stopped 12/10/16 2141)  sodium chloride 0.9 % bolus 1,000 mL (0 mLs Intravenous Stopped 12/10/16 2138)  sodium chloride 0.9 % bolus 1,000 mL (0 mLs Intravenous Stopped 12/10/16 2246)  pantoprazole (PROTONIX) injection 40 mg (40 mg Intravenous Given 12/10/16 2241)    Initial Impression / Assessment and Plan / ED Course  I have reviewed the triage vital signs and the nursing notes.  83 y.o. male with above stated PMHx, HPI, and physical. Afib (recent GIB, taken off Eliquis), ICM w/ EF 20%, stage IV CKD, COPD and OSA. Recent admission for GIB. Now in SNF. Recent cough with AMS. Fever to 102F. Referred from facility for further evaluation.  Concern for sepsis. Blood and urine cultures obtained. Patient given IV fluids and started on broad-spectrum antibiotics with vancomycin/sedation. Chest x-ray concerning for right-sided opacity. Lactic acid 1.6. Metabolic panel notable for mild AKI. Hgb 7.8 from 9. Given protonix given recent melena during last hospitalization and type & screen sent. CT head showing no acute intracranial pathology.  Laboratory and imaging results were personally reviewed by myself and used in the medical decision making of this patient's treatment and disposition.  Pt admitted to medicine for further evaluation and management of sepsis with possible GIB. Pt understands and agrees with the plan and has no further questions or  concerns.   Pt care discussed with and followed by my attending, Dr. Jules Schick, MD Pager [phone]  Final Clinical Impressions(s) / ED Diagnoses   Final diagnoses:  Cough  Fever in adult  Sepsis (HCC)  HAP  (hospital-acquired pneumonia)  AKI (acute kidney injury) (HCC)  Acute blood loss anemia  PICC (peripherally inserted central catheter) in place   New Prescriptions New Prescriptions   No medications on file     Angelina Ok, MD 12/11/16 0023    Alvira Monday, MD 12/18/16 1022

## 2016-12-10 NOTE — ED Triage Notes (Signed)
Pt arrived via EMS from [facility] in [place]/o [place].  LKW 12/04/16.  Slurred speech started yesterday.  PICC line.  DNR

## 2016-12-10 NOTE — ED Notes (Signed)
Recollect blood bank,  QNS

## 2016-12-11 ENCOUNTER — Encounter (HOSPITAL_COMMUNITY): Payer: Self-pay | Admitting: Internal Medicine

## 2016-12-11 DIAGNOSIS — L8990 Pressure ulcer of unspecified site, unspecified stage: Secondary | ICD-10-CM | POA: Insufficient documentation

## 2016-12-11 LAB — RESPIRATORY PANEL BY PCR
Adenovirus: NOT DETECTED
BORDETELLA PERTUSSIS-RVPCR: NOT DETECTED
CHLAMYDOPHILA PNEUMONIAE-RVPPCR: NOT DETECTED
CORONAVIRUS NL63-RVPPCR: NOT DETECTED
Coronavirus 229E: NOT DETECTED
Coronavirus HKU1: NOT DETECTED
Coronavirus OC43: NOT DETECTED
INFLUENZA A-RVPPCR: NOT DETECTED
Influenza B: NOT DETECTED
Metapneumovirus: NOT DETECTED
Mycoplasma pneumoniae: NOT DETECTED
PARAINFLUENZA VIRUS 3-RVPPCR: NOT DETECTED
PARAINFLUENZA VIRUS 4-RVPPCR: NOT DETECTED
Parainfluenza Virus 1: NOT DETECTED
Parainfluenza Virus 2: NOT DETECTED
RHINOVIRUS / ENTEROVIRUS - RVPPCR: NOT DETECTED
Respiratory Syncytial Virus: NOT DETECTED

## 2016-12-11 LAB — APTT: aPTT: 38 s — ABNORMAL HIGH (ref 24–36)

## 2016-12-11 LAB — CBC WITH DIFFERENTIAL/PLATELET
BASOS ABS: 0 10*3/uL (ref 0.0–0.1)
Basophils Relative: 0 %
EOS PCT: 1 %
Eosinophils Absolute: 0.1 10*3/uL (ref 0.0–0.7)
HCT: 25.3 % — ABNORMAL LOW (ref 39.0–52.0)
Hemoglobin: 7.6 g/dL — ABNORMAL LOW (ref 13.0–17.0)
LYMPHS PCT: 10 %
Lymphs Abs: 1 10*3/uL (ref 0.7–4.0)
MCH: 26.6 pg (ref 26.0–34.0)
MCHC: 30 g/dL (ref 30.0–36.0)
MCV: 88.5 fL (ref 78.0–100.0)
MONO ABS: 0.3 10*3/uL (ref 0.1–1.0)
MONOS PCT: 3 %
NEUTROS ABS: 8.4 10*3/uL — AB (ref 1.7–7.7)
Neutrophils Relative %: 86 %
Platelets: 154 10*3/uL (ref 150–400)
RBC: 2.86 MIL/uL — ABNORMAL LOW (ref 4.22–5.81)
RDW: 15.6 % — AB (ref 11.5–15.5)
WBC: 9.7 10*3/uL (ref 4.0–10.5)

## 2016-12-11 LAB — COMPREHENSIVE METABOLIC PANEL WITH GFR
ALT: 12 U/L — ABNORMAL LOW (ref 17–63)
AST: 19 U/L (ref 15–41)
Albumin: 2.2 g/dL — ABNORMAL LOW (ref 3.5–5.0)
Alkaline Phosphatase: 66 U/L (ref 38–126)
Anion gap: 10 (ref 5–15)
BUN: 28 mg/dL — ABNORMAL HIGH (ref 6–20)
CO2: 23 mmol/L (ref 22–32)
Calcium: 8.1 mg/dL — ABNORMAL LOW (ref 8.9–10.3)
Chloride: 112 mmol/L — ABNORMAL HIGH (ref 101–111)
Creatinine, Ser: 2.14 mg/dL — ABNORMAL HIGH (ref 0.61–1.24)
GFR calc Af Amer: 31 mL/min — ABNORMAL LOW
GFR calc non Af Amer: 26 mL/min — ABNORMAL LOW
Glucose, Bld: 208 mg/dL — ABNORMAL HIGH (ref 65–99)
Potassium: 4.4 mmol/L (ref 3.5–5.1)
Sodium: 145 mmol/L (ref 135–145)
Total Bilirubin: 0.7 mg/dL (ref 0.3–1.2)
Total Protein: 5.3 g/dL — ABNORMAL LOW (ref 6.5–8.1)

## 2016-12-11 LAB — LACTIC ACID, PLASMA: LACTIC ACID, VENOUS: 3.1 mmol/L — AB (ref 0.5–1.9)

## 2016-12-11 LAB — TYPE AND SCREEN
ABO/RH(D): O POS
Antibody Screen: NEGATIVE

## 2016-12-11 LAB — GLUCOSE, CAPILLARY
GLUCOSE-CAPILLARY: 194 mg/dL — AB (ref 65–99)
GLUCOSE-CAPILLARY: 233 mg/dL — AB (ref 65–99)
GLUCOSE-CAPILLARY: 177 mg/dL — AB (ref 65–99)
Glucose-Capillary: 187 mg/dL — ABNORMAL HIGH (ref 65–99)

## 2016-12-11 LAB — PROTIME-INR
INR: 1.18
Prothrombin Time: 15 s (ref 11.4–15.2)

## 2016-12-11 LAB — HEMOGLOBIN: HEMOGLOBIN: 8 g/dL — AB (ref 13.0–17.0)

## 2016-12-11 LAB — AMMONIA: AMMONIA: 30 umol/L (ref 9–35)

## 2016-12-11 MED ORDER — ORAL CARE MOUTH RINSE
15.0000 mL | Freq: Two times a day (BID) | OROMUCOSAL | Status: DC
  Administered 2016-12-11 (×2): 15 mL via OROMUCOSAL

## 2016-12-11 MED ORDER — VANCOMYCIN HCL IN DEXTROSE 1-5 GM/200ML-% IV SOLN
1000.0000 mg | INTRAVENOUS | Status: DC
  Administered 2016-12-11 – 2016-12-13 (×3): 1000 mg via INTRAVENOUS
  Filled 2016-12-11 (×3): qty 200

## 2016-12-11 MED ORDER — SODIUM CHLORIDE 0.9% FLUSH: 10.0000 mL | INTRAVENOUS | Status: DC | PRN

## 2016-12-11 MED ORDER — LORAZEPAM 2 MG/ML IJ SOLN
0.5000 mg | Freq: Once | INTRAMUSCULAR | Status: AC
  Administered 2016-12-11: 0.5 mg via INTRAVENOUS
  Filled 2016-12-11: qty 1

## 2016-12-11 MED ORDER — SODIUM CHLORIDE 0.9 % IV SOLN: INTRAVENOUS | Status: DC

## 2016-12-11 MED ORDER — MAGNESIUM SULFATE 4 GM/100ML IV SOLN
4.0000 g | Freq: Once | INTRAVENOUS | Status: AC
Start: 2016-12-11 — End: 2016-12-11
  Administered 2016-12-11: 4 g via INTRAVENOUS
  Filled 2016-12-11: qty 100

## 2016-12-11 MED ORDER — SODIUM CHLORIDE 0.9 % IV SOLN
INTRAVENOUS | Status: DC
  Administered 2016-12-11: 04:00:00 via INTRAVENOUS

## 2016-12-11 MED ORDER — ACETAMINOPHEN 650 MG RE SUPP
650.0000 mg | RECTAL | Status: DC | PRN
Start: 2016-12-11 — End: 2016-12-16
  Administered 2016-12-11 – 2016-12-15 (×3): 650 mg via RECTAL
  Filled 2016-12-11 (×4): qty 1

## 2016-12-11 MED ORDER — INSULIN ASPART 100 UNIT/ML ~~LOC~~ SOLN
0.0000 [IU] | Freq: Three times a day (TID) | SUBCUTANEOUS | Status: DC
  Administered 2016-12-11 (×2): 3 [IU] via SUBCUTANEOUS
  Administered 2016-12-11: 5 [IU] via SUBCUTANEOUS
  Administered 2016-12-12 (×3): 3 [IU] via SUBCUTANEOUS
  Administered 2016-12-13: 5 [IU] via SUBCUTANEOUS
  Administered 2016-12-13 – 2016-12-14 (×3): 3 [IU] via SUBCUTANEOUS
  Administered 2016-12-14: 2 [IU] via SUBCUTANEOUS
  Administered 2016-12-14 – 2016-12-16 (×6): 3 [IU] via SUBCUTANEOUS
  Administered 2016-12-16: 2 [IU] via SUBCUTANEOUS
  Administered 2016-12-17 (×2): 3 [IU] via SUBCUTANEOUS
  Administered 2016-12-17 – 2016-12-18 (×2): 2 [IU] via SUBCUTANEOUS

## 2016-12-11 MED ORDER — CHLORHEXIDINE GLUCONATE 0.12 % MT SOLN
15.0000 mL | Freq: Two times a day (BID) | OROMUCOSAL | Status: DC
  Administered 2016-12-11 (×2): 15 mL via OROMUCOSAL
  Filled 2016-12-11 (×2): qty 15

## 2016-12-11 MED ORDER — METOPROLOL TARTRATE 5 MG/5ML IV SOLN
5.0000 mg | Freq: Once | INTRAVENOUS | Status: AC
  Administered 2016-12-11: 5 mg via INTRAVENOUS
  Filled 2016-12-11: qty 5

## 2016-12-11 MED ORDER — PANTOPRAZOLE SODIUM 40 MG IV SOLR
40.0000 mg | Freq: Two times a day (BID) | INTRAVENOUS | Status: DC
  Administered 2016-12-11 – 2016-12-19 (×17): 40 mg via INTRAVENOUS
  Filled 2016-12-11 (×17): qty 40

## 2016-12-11 NOTE — Progress Notes (Addendum)
Patient ID: Devin Heath, male   DOB: Sep 14, 1929, 83 y.o.   MRN: [id number]    PROGRESS NOTE    Devin Heath  WVP:[id number] DOB: 1929/06/14 DOA: 12/10/2016  PCP: [name], MD   Brief Narrative:  83 y.o. male with medical history significant of A. fib, osteoarthritis, CAD, diastolic dysfunction, COPD, type 2 diabetes, gout, hyperlipidemia, IBS, obstructive sleep apnea on CPAP, renal insufficiency, rheumatoid arthritis, restless leg syndrome who was recently discharged from the hospital due to GI bleed secondary to anticoagulation for atrial fibrillation, now brought from nursing home facility for evaluation of altered mental status and fever for the past 2 days. The patient was receiving ceftriaxone through Picc line at the [facility].  ED Course: The patient received 2 L of normal saline bolus, vancomycin and Zosyn in the ED. Work up showed WBC of 10.4, hemoglobin level of 7.8 g/dL, platelet of 546. BMP showed BUN 28, creatinine 1.95 and glucose 149 mg/dL. His second lactic acid level was 2.46 mmol/L. urine analysis did not show signs of UTI, chest radiograph showed right lower quadrant infiltrate.  Assessment & Plan: Sepsis due to HCAP (healthcare-associated pneumonia) - pt with tachycardia and tachypnea with elevated lactic acid  - right infrahilar area, unknown pathogen  - continue vancomycin and zosyn day #2 - follow up on blood cultures - repeat CT chest in AM for follow up and clearer evaluation  - repeat lactic acid in AM  Acute hypoxic respiratory failure secondary to HCAP - on BiPAP this AM - ABX as noted above - CT chest in AM  Atrial fibrillation with rapid ventricular response (HCC) - CHA2DS2-VASc Score of at least 4 - not AC due to recent significant GI bleed   Acute on chronic renal injury stage III - IVF provided but Cr continues to trend up - review of records indicated most recent Cr in 1.8 - 2.1 range  - repeat BMP in AM  Hyperlipidemia - Hold  statin for now  CAD (coronary artery disease) - Not on antiplatelet agent or anticoagulation due to recent GI bleed.  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  Anemia of chronic disease and recent GI bleed - transfuse for Hg < 7.5 - CBC In AM  Obesity  - Body mass index is 30.77 kg/m.   DVT prophylaxis: SCD's Code Status: DNR Family Communication: Patient at bedside  Disposition Plan: to be determined   Consultants:   None  Procedures:   None  Antimicrobials:   Vancomycin 1/18 -->  Zosyn 1/19 -->  Subjective: No events overnight, on BiPAP this AM.   Objective: Vitals:   12/11/16 1148 12/11/16 1600 12/11/16 1601 12/11/16 1939  BP: 111/71 (!) 122/92  117/82  Pulse: 90 (!) 149 (!) 106 (!) 117  Resp: 20 (!) 32 (!) 21 (!) 25  Temp: 97.1 F (36.2 C) 98.7 F (37.1 C)  99.5 F (37.5 C)  TempSrc: Axillary Oral  Oral  SpO2: 99% 100% 100% 100%  Weight:      Height:        Intake/Output Summary (Last 24 hours) at 12/11/16 1950 Last data filed at 12/11/16 1800  Gross per 24 hour  Intake          3213.33 ml  Output  0 ml  Net          3213.33 ml   Filed Weights   12/10/16 1916 12/11/16 0137  Weight: 88.5 kg (195 lb) 89.1 kg (196 lb 6.9 oz)    Examination:  General exam: Appears somnolent but on BiPAP Respiratory system: rhonchi at bases with diminished air movement bilaterally  Cardiovascular system: IRRR. No JVD, murmurs, rubs, gallops or clicks. No pedal edema. Gastrointestinal system: Abdomen is nondistended, soft and nontender. No organomegaly or masses felt.  Central nervous system: somnolent but moving all 4 extremities spontaneously   Data Reviewed: I have personally reviewed following labs and imaging studies  CBC:  Recent Labs Lab  12/10/16 2026 12/11/16 0203 12/11/16 0822  WBC 10.4  --  9.7  NEUTROABS 8.8*  --  8.4*  HGB 7.8* 8.0* 7.6*  HCT 26.1*  --  25.3*  MCV 87.6  --  88.5  PLT 152  --  154   Basic Metabolic Panel:  Recent Labs Lab 12/10/16 2026 12/10/16 2329 12/11/16 0822  NA 141  --  145  K 4.3  --  4.4  CL 106  --  112*  CO2 24  --  23  GLUCOSE 149*  --  208*  BUN 28*  --  28*  CREATININE 1.95*  --  2.14*  CALCIUM 8.2*  --  8.1*  MG  --  1.6*  --   PHOS  --  3.5  --    Liver Function Tests:  Recent Labs Lab 12/10/16 2026 12/11/16 0822  AST 17 19  ALT 13* 12*  ALKPHOS 58 66  BILITOT 0.3 0.7  PROT 5.5* 5.3*  ALBUMIN 2.2* 2.2*    Recent Labs Lab 12/10/16 2306  AMMONIA 30   Coagulation Profile:  Recent Labs Lab 12/11/16 0203  INR 1.18   CBG:  Recent Labs Lab 12/11/16 0842 12/11/16 1150 12/11/16 1640  GLUCAP 194* 187* 233*   Urine analysis:    Component Value Date/Time   COLORURINE YELLOW 12/10/2016 2137   APPEARANCEUR HAZY (A) 12/10/2016 2137   LABSPEC 1.012 12/10/2016 2137   PHURINE 5.0 12/10/2016 2137   GLUCOSEU NEGATIVE 12/10/2016 2137   HGBUR NEGATIVE 12/10/2016 2137   BILIRUBINUR NEGATIVE 12/10/2016 2137   KETONESUR NEGATIVE 12/10/2016 2137   PROTEINUR NEGATIVE 12/10/2016 2137   NITRITE NEGATIVE 12/10/2016 2137   LEUKOCYTESUR TRACE (A) 12/10/2016 2137   Recent Results (from the past 240 hour(s))  Respiratory Panel by PCR     Status: None   Collection Time: 12/10/16  7:58 PM  Result Value Ref Range Status   Adenovirus NOT DETECTED NOT DETECTED Final   Coronavirus 229E NOT DETECTED NOT DETECTED Final   Coronavirus HKU1 NOT DETECTED NOT DETECTED Final   Coronavirus NL63 NOT DETECTED NOT DETECTED Final   Coronavirus OC43 NOT DETECTED NOT DETECTED Final   Metapneumovirus NOT DETECTED NOT DETECTED Final   Rhinovirus / Enterovirus NOT DETECTED NOT DETECTED Final   Influenza A NOT DETECTED NOT DETECTED Final   Influenza B NOT DETECTED NOT DETECTED Final    Parainfluenza Virus 1 NOT DETECTED NOT DETECTED Final   Parainfluenza Virus 2 NOT DETECTED NOT DETECTED Final   Parainfluenza Virus 3 NOT DETECTED NOT DETECTED Final   Parainfluenza Virus 4 NOT DETECTED NOT DETECTED Final   Respiratory Syncytial Virus NOT DETECTED NOT DETECTED Final   Bordetella pertussis NOT DETECTED NOT DETECTED Final   Chlamydophila pneumoniae NOT DETECTED NOT DETECTED Final   Mycoplasma pneumoniae NOT DETECTED NOT DETECTED  Final  Blood Culture (routine x 2)     Status: None (Preliminary result)   Collection Time: 12/10/16  8:18 PM  Result Value Ref Range Status   Specimen Description BLOOD LEFT HAND  Final   Special Requests IN PEDIATRIC BOTTLE  Final   Culture NO GROWTH < 24 HOURS  Final   Report Status PENDING  Incomplete  Blood Culture (routine x 2)     Status: None (Preliminary result)   Collection Time: 12/10/16 11:08 PM  Result Value Ref Range Status   Specimen Description BLOOD LEFT HAND  Final   Special Requests IN PEDIATRIC BOTTLE  Final   Culture NO GROWTH < 24 HOURS  Final   Report Status PENDING  Incomplete    Radiology Studies: Ct Head Wo Contrast  Result Date: 12/10/2016 CLINICAL DATA:  Fever and hypotension, slurred speech, suspect sepsis. Assess for CVA. EXAM: CT HEAD WITHOUT CONTRAST TECHNIQUE: Contiguous axial images were obtained from the base of the skull through the vertex without intravenous contrast. COMPARISON:  None. FINDINGS: Brain: There is generalized age related parenchymal atrophy with commensurate dilatation of the ventricles and sulci. Mild chronic small vessel ischemic changes noted within the deep periventricular white matter regions bilaterally. There is no mass, hemorrhage, edema or other evidence of acute parenchymal abnormality. No extra-axial hemorrhage. Vascular: There are chronic calcified atherosclerotic changes of the large vessels at the skull base. No unexpected hyperdense vessel. Skull: Normal. Negative for  fracture or focal lesion. Sinuses/Orbits: Extensive chronic appearing mucosal thickening throughout the bilateral maxillary sinuses. Orbital/periorbital soft tissues are unremarkable. Other: None. IMPRESSION: 1. No acute findings.  No intracranial mass, hemorrhage or edema. 2. Chronic appearing paranasal sinus disease. Electronically Signed   By: Bary Richard M.D.   On: 12/10/2016 21:23   Dg Chest Portable 1 View  Result Date: 12/10/2016 CLINICAL DATA:  Fever EXAM: PORTABLE CHEST 1 VIEW COMPARISON:  11/25/2016 FINDINGS: Median sternotomy wires are again evident. A right upper extremity catheter tip is difficult to visualize, catheter tubing is seen to the level of SVC. Mild atelectasis or infiltrate at the right lung base. No effusion. Stable cardiomediastinal silhouette. No pneumothorax. IMPRESSION: Mild hazy atelectasis or infiltrate at the right infrahilar lung. Stable borderline to mild cardiomegaly without overt failure. Electronically Signed   By: Jasmine Pang M.D.   On: 12/10/2016 23:18    Scheduled Meds: . chlorhexidine  15 mL Mouth Rinse BID  . insulin aspart  0-15 Units Subcutaneous TID WC  . mouth rinse  15 mL Mouth Rinse q12n4p  . pantoprazole (PROTONIX) IV  40 mg Intravenous Q12H  . piperacillin-tazobactam (ZOSYN)  IV  3.375 g Intravenous Q8H  . vancomycin  1,000 mg Intravenous Q24H   Continuous Infusions:   LOS: 1 day   Time spent: 20 minutes   Debbora Presto, MD [facility] Pager [phone]  If 7PM-7AM, please contact night-coverage [email or web address] Password [email or web address] 12/11/2016, 7:50 PM

## 2016-12-11 NOTE — Progress Notes (Signed)
CRITICAL VALUE ALERT  Critical value received: Lactic 3.1  Date of notification:  12/11/16  Time of notification:  0303  Critical value read back:Yes.    Nurse who received alert:  Andrey Cota  MD notified (1st page): [place]  Time of first page:  0306  Responding MD:  Bretta Bang  Time MD responded:  [phone]

## 2016-12-11 NOTE — Progress Notes (Signed)
Pharmacy Antibiotic Note  Devin Heath is a 83 y.o. male admitted on 12/10/2016 with sepsis.  Pharmacy has been consulted for vancomycin and zosyn dosing.  Continues on abx for HCAP. Tmax 103 yesterday, WBC wnl. SCr up today, CrCl ~57ml/min  Plan: Continue Zosyn 3.375 gm IV q8h (4 hour infusion) Decrease vancomycin to 1g IV Q24 Monitor clinical picture, renal function, VT prn F/U C&S, abx deescalation / LOT  Height: 5\' 7"  (170.2 cm) Weight: 196 lb 6.9 oz (89.1 kg) IBW/kg (Calculated) : 66.1  Temp (24hrs), Avg:98.9 F (37.2 C), Min:97.1 F (36.2 C), Max:103 F (39.4 C)   Recent Labs Lab 12/10/16 2026 12/10/16 2027 12/10/16 2318 12/11/16 0203 12/11/16 0822  WBC 10.4  --   --   --  9.7  CREATININE 1.95*  --   --   --  2.14*  LATICACIDVEN  --  1.61 2.46* 3.1*  --     Estimated Creatinine Clearance: 26.9 mL/min (by C-G formula based on SCr of 2.14 mg/dL (H)).    Allergies  Allergen Reactions  . Ace Inhibitors Cough  . Ambrisentan Other (See Comments)    On MAR  . Clindamycin/Lincomycin Other (See Comments)    On MAR  . Erythromycin Other (See Comments)    On MAR  . Feldene [Piroxicam] Other (See Comments)    On MAR  . Keflex [Cephalexin] Other (See Comments)    On MAR  . Omnicef [Cefdinir] Other (See Comments)    On MAR  . Simvastatin Other (See Comments)    On MAR   Antimicrobials this admission:  Zosyn 1/18 >>  Vancomycin 1/18 >>   Dose adjustments this admission:  N/a  Microbiology results:  1/18 BCx: sent 1/18 UCx: sent  1/18 Sputum: sent  1/18 Resp Panel: negative  2/18, PharmD, [facility] Clinical Pharmacist Pager [phone] 12/11/2016 2:46 PM

## 2016-12-11 NOTE — Progress Notes (Signed)
Pt is on NIV at this time tolerating it well.  

## 2016-12-11 NOTE — Progress Notes (Signed)
Upon arrival from ED pt HR 140's -170's . MD paged. MD ordered 5mg  lopressor IV. Will continue to monitor.

## 2016-12-11 NOTE — Progress Notes (Signed)
Little breakdown noted over nose. Redness noted. Gauze strip cushion place over nose to prevent further redness/breakdown while NIV mask is applied. Pt is tolerating NIIV well at this time no distress noted.

## 2016-12-11 NOTE — Progress Notes (Signed)
SLP Cancellation Note  Patient Details Name: Devin Heath MRN: [id number] DOB: June 27, 1929   Cancelled treatment:       Reason Eval/Treat Not Completed: Fatigue/lethargy limiting ability to participate   Blenda Mounts Laurice 12/11/2016, 2:21 PM

## 2016-12-12 ENCOUNTER — Inpatient Hospital Stay (HOSPITAL_COMMUNITY): Payer: Medicare Other

## 2016-12-12 LAB — RETICULOCYTES
RBC.: 2.88 MIL/uL — AB (ref 4.22–5.81)
RETIC COUNT ABSOLUTE: 43.2 10*3/uL (ref 19.0–186.0)
Retic Ct Pct: 1.5 % (ref 0.4–3.1)

## 2016-12-12 LAB — IRON AND TIBC
Iron: 8 ug/dL — ABNORMAL LOW (ref 45–182)
Saturation Ratios: 3 % — ABNORMAL LOW (ref 17.9–39.5)
TIBC: 231 ug/dL — ABNORMAL LOW (ref 250–450)
UIBC: 223 ug/dL

## 2016-12-12 LAB — BLOOD CULTURE ID PANEL (REFLEXED)
ACINETOBACTER BAUMANNII: NOT DETECTED
CANDIDA ALBICANS: NOT DETECTED
CANDIDA TROPICALIS: NOT DETECTED
Candida glabrata: NOT DETECTED
Candida krusei: NOT DETECTED
Candida parapsilosis: NOT DETECTED
ENTEROBACTER CLOACAE COMPLEX: NOT DETECTED
ENTEROBACTERIACEAE SPECIES: NOT DETECTED
ENTEROCOCCUS SPECIES: NOT DETECTED
Escherichia coli: NOT DETECTED
HAEMOPHILUS INFLUENZAE: NOT DETECTED
Klebsiella oxytoca: NOT DETECTED
Klebsiella pneumoniae: NOT DETECTED
LISTERIA MONOCYTOGENES: NOT DETECTED
METHICILLIN RESISTANCE: NOT DETECTED
NEISSERIA MENINGITIDIS: NOT DETECTED
PSEUDOMONAS AERUGINOSA: NOT DETECTED
Proteus species: NOT DETECTED
STREPTOCOCCUS PNEUMONIAE: NOT DETECTED
STREPTOCOCCUS PYOGENES: NOT DETECTED
STREPTOCOCCUS SPECIES: NOT DETECTED
Serratia marcescens: NOT DETECTED
Staphylococcus aureus (BCID): NOT DETECTED
Staphylococcus species: DETECTED — AB
Streptococcus agalactiae: NOT DETECTED

## 2016-12-12 LAB — BASIC METABOLIC PANEL
Anion gap: 8 (ref 5–15)
BUN: 25 mg/dL — ABNORMAL HIGH (ref 6–20)
CHLORIDE: 115 mmol/L — AB (ref 101–111)
CO2: 23 mmol/L (ref 22–32)
Calcium: 8.2 mg/dL — ABNORMAL LOW (ref 8.9–10.3)
Creatinine, Ser: 1.7 mg/dL — ABNORMAL HIGH (ref 0.61–1.24)
GFR calc Af Amer: 41 mL/min — ABNORMAL LOW (ref >60)
GFR, EST NON AFRICAN AMERICAN: 35 mL/min — AB (ref >60)
Glucose, Bld: 185 mg/dL — ABNORMAL HIGH (ref 65–99)
POTASSIUM: 3.9 mmol/L (ref 3.5–5.1)
SODIUM: 146 mmol/L — AB (ref 135–145)

## 2016-12-12 LAB — CBC WITH DIFFERENTIAL/PLATELET
Basophils Absolute: 0 10*3/uL (ref 0.0–0.1)
Basophils Relative: 0 %
EOS ABS: 0.2 10*3/uL (ref 0.0–0.7)
EOS PCT: 2 %
HCT: 25.2 % — ABNORMAL LOW (ref 39.0–52.0)
HEMOGLOBIN: 7.6 g/dL — AB (ref 13.0–17.0)
LYMPHS ABS: 0.4 10*3/uL — AB (ref 0.7–4.0)
LYMPHS PCT: 5 %
MCH: 26.4 pg (ref 26.0–34.0)
MCHC: 30.2 g/dL (ref 30.0–36.0)
MCV: 87.5 fL (ref 78.0–100.0)
Monocytes Absolute: 0.4 10*3/uL (ref 0.1–1.0)
Monocytes Relative: 4 %
NEUTROS PCT: 89 %
Neutro Abs: 7.6 10*3/uL (ref 1.7–7.7)
PLATELETS: 173 10*3/uL (ref 150–400)
RBC: 2.88 MIL/uL — AB (ref 4.22–5.81)
RDW: 15.7 % — ABNORMAL HIGH (ref 11.5–15.5)
WBC: 8.5 10*3/uL (ref 4.0–10.5)

## 2016-12-12 LAB — GLUCOSE, CAPILLARY
GLUCOSE-CAPILLARY: 181 mg/dL — AB (ref 65–99)
Glucose-Capillary: 171 mg/dL — ABNORMAL HIGH (ref 65–99)
Glucose-Capillary: 173 mg/dL — ABNORMAL HIGH (ref 65–99)
Glucose-Capillary: 179 mg/dL — ABNORMAL HIGH (ref 65–99)

## 2016-12-12 LAB — MAGNESIUM: MAGNESIUM: 2.6 mg/dL — AB (ref 1.7–2.4)

## 2016-12-12 LAB — AMMONIA: AMMONIA: 30 umol/L (ref 9–35)

## 2016-12-12 LAB — URINE CULTURE: Culture: NO GROWTH

## 2016-12-12 LAB — FERRITIN: Ferritin: 115 ng/mL (ref 24–336)

## 2016-12-12 LAB — FOLATE: FOLATE: 18.2 ng/mL (ref >5.9)

## 2016-12-12 LAB — LACTIC ACID, PLASMA: Lactic Acid, Venous: 1.2 mmol/L (ref 0.5–1.9)

## 2016-12-12 LAB — VITAMIN B12: VITAMIN B 12: 389 pg/mL (ref 180–914)

## 2016-12-12 MED ORDER — FUROSEMIDE 10 MG/ML IJ SOLN
40.0000 mg | Freq: Two times a day (BID) | INTRAMUSCULAR | Status: DC
  Administered 2016-12-12 – 2016-12-14 (×4): 40 mg via INTRAVENOUS
  Filled 2016-12-12 (×4): qty 4

## 2016-12-12 MED ORDER — FERROUS SULFATE 325 (65 FE) MG PO TABS
325.0000 mg | ORAL_TABLET | Freq: Two times a day (BID) | ORAL | Status: DC
  Administered 2016-12-13 – 2016-12-16 (×5): 325 mg via ORAL
  Filled 2016-12-12 (×10): qty 1

## 2016-12-12 MED ORDER — DILTIAZEM LOAD VIA INFUSION
10.0000 mg | Freq: Once | INTRAVENOUS | Status: AC
  Administered 2016-12-12: 10 mg via INTRAVENOUS
  Filled 2016-12-12: qty 10

## 2016-12-12 MED ORDER — ORAL CARE MOUTH RINSE
15.0000 mL | Freq: Two times a day (BID) | OROMUCOSAL | Status: DC
  Administered 2016-12-12 – 2016-12-18 (×12): 15 mL via OROMUCOSAL

## 2016-12-12 MED ORDER — METOPROLOL TARTRATE 5 MG/5ML IV SOLN: 5.0000 mg | Freq: Four times a day (QID) | INTRAVENOUS | Status: DC | PRN

## 2016-12-12 MED ORDER — CHLORHEXIDINE GLUCONATE 0.12 % MT SOLN
15.0000 mL | Freq: Two times a day (BID) | OROMUCOSAL | Status: DC
  Administered 2016-12-12 – 2016-12-19 (×15): 15 mL via OROMUCOSAL
  Filled 2016-12-12 (×15): qty 15

## 2016-12-12 MED ORDER — DILTIAZEM HCL 100 MG IV SOLR
5.0000 mg/h | INTRAVENOUS | Status: DC
  Administered 2016-12-12 – 2016-12-13 (×3): 5 mg/h via INTRAVENOUS
  Administered 2016-12-14 – 2016-12-17 (×6): 10 mg/h via INTRAVENOUS
  Filled 2016-12-12 (×13): qty 100

## 2016-12-12 NOTE — Evaluation (Signed)
Clinical/Bedside Swallow Evaluation Patient Details  Name: Devin Heath MRN: [id number] Date of Birth: December 18, 1928  Today's Date: 12/12/2016 Time: SLP Start Time (ACUTE ONLY): 1545 SLP Stop Time (ACUTE ONLY): 1605 SLP Time Calculation (min) (ACUTE ONLY): 20 min  Past Medical History:  Past Medical History:  Diagnosis Date  . A-fib (HCC)   . Arthritis   . CAD (coronary artery disease)   . COPD (chronic obstructive pulmonary disease) (HCC)   . Diabetes (HCC)   . Former smoker   . Gout   . Hard of hearing   . Heart failure (HCC)   . Hyperlipidemia   . IBS (irritable bowel syndrome)   . Ischemic cardiomyopathy   . On home oxygen therapy    "2L q hs" (10/13/2016)  . OSA on CPAP   . Renal insufficiency   . Rheumatoid arthritis (HCC)   . RLS (restless legs syndrome)   . Vitamin D deficiency    Past Surgical History:  Past Surgical History:  Procedure Laterality Date  . APPENDECTOMY    . CARDIAC SURGERY     BY PASS  . COLONOSCOPY WITH PROPOFOL N/A 11/25/2016   Procedure: COLONOSCOPY WITH PROPOFOL;  Surgeon: Iva Boop, MD;  Location: [facility] ENDOSCOPY;  Service: Endoscopy;  Laterality: N/A;  . ESOPHAGOGASTRODUODENOSCOPY N/A 11/24/2016   Procedure: ESOPHAGOGASTRODUODENOSCOPY (EGD);  Surgeon: Iva Boop, MD;  Location: [facility] ENDOSCOPY;  Service: Endoscopy;  Laterality: N/A;  . FINGER AMPUTATION Left    RING FINGER  . GIVENS CAPSULE STUDY N/A 11/25/2016   Procedure: GIVENS CAPSULE STUDY;  Surgeon: Iva Boop, MD;  Location: [facility] ENDOSCOPY;  Service: Endoscopy;  Laterality: N/A;  . NECK SURGERY    . TONSILLECTOMY     HPI:  83 y.o.malewith medical history significant of A. fib, osteoarthritis, CAD, diastolic dysfunction, COPD, type 2 diabetes, gout, hyperlipidemia, IBS, obstructive sleep apnea on CPAP, renal insufficiency, rheumatoid arthritis, restless leg syndrome who was recently discharged from the hospital due to GI bleed secondary to anticoagulation for atrial fibrillation,  now brought from nursing home facility for evaluation of altered mental status and fever for the past 2 days. Dx HCAP, acute on chronic renal insufficiency. CXR 12/10/16 impression: Mild hazy atelectasis or infiltrate at the right infrahilar lung. Stable borderline to mild cardiomegaly without overt failure. Head CT 12/10/16 showed no acute findings.   Assessment / Plan / Recommendation Clinical Impression  Patient presents with overt signs of aspiration at bedside with thin liquids and nectar thick consistencies. Oral deficits include reduced awareness of bolus, decreased labial seal resulting in oral spillage. Suspect reduced lingual coordination and bolus control with premature spillage/swallow delay. Patient with delayed cough, increased work of breathing and elevated respiratory rate levels following presentation of liquids. Tolerated ice chips x4 and pureed solids with no overt signs of aspiration or change in vital signs. Suspect deficits are sensory/cognitive in nature given sepsis. Recommend NPO with the exception of ice chips following oral care and meds crushed in puree. SLP will follow up to determine readiness for PO diet.    Aspiration Risk  Moderate aspiration risk    Diet Recommendation Ice chips PRN after oral care;NPO except meds   Medication Administration: Crushed with puree Supervision: Full supervision/cueing for compensatory strategies Postural Changes: Seated upright at 90 degrees    Other  Recommendations Oral Care Recommendations: Oral care QID;Oral care prior to ice chip/H20   Follow up Recommendations Skilled Nursing facility      Frequency and Duration min 2x/week  2  weeks       Prognosis Prognosis for Safe Diet Advancement: Good      Swallow Study   General Date of Onset: 12/10/16 HPI: 83 y.o.malewith medical history significant of A. fib, osteoarthritis, CAD, diastolic dysfunction, COPD, type 2 diabetes, gout, hyperlipidemia, IBS, obstructive sleep apnea  on CPAP, renal insufficiency, rheumatoid arthritis, restless leg syndrome who was recently discharged from the hospital due to GI bleed secondary to anticoagulation for atrial fibrillation, now brought from nursing home facility for evaluation of altered mental status and fever for the past 2 days. Dx HCAP, acute on chronic renal insufficiency. CXR 12/10/16 impression: Mild hazy atelectasis or infiltrate at the right infrahilar lung. Stable borderline to mild cardiomegaly without overt failure. Head CT 12/10/16 showed no acute findings. Type of Study: Bedside Swallow Evaluation Previous Swallow Assessment: none per chart Diet Prior to this Study: Dysphagia 3 (soft);Thin liquids Temperature Spikes Noted: No (100.5) Respiratory Status: Nasal cannula History of Recent Intubation: No Behavior/Cognition: Alert;Cooperative;Requires cueing Oral Cavity Assessment: Dried secretions;Dry Oral Care Completed by SLP: Yes Oral Cavity - Dentition: Dentures, top;Dentures, bottom Vision: Functional for self-feeding Self-Feeding Abilities: Needs assist Patient Positioning: Upright in bed Baseline Vocal Quality: Wet Volitional Cough: Congested Volitional Swallow: Unable to elicit    Oral/Motor/Sensory Function Overall Oral Motor/Sensory Function: Within functional limits   Ice Chips Ice chips: Within functional limits   Thin Liquid Thin Liquid: Impaired Presentation: Cup;Spoon Oral Phase Impairments: Poor awareness of bolus;Reduced labial seal;Reduced lingual movement/coordination Oral Phase Functional Implications: Left anterior spillage Pharyngeal  Phase Impairments: Wet Vocal Quality;Cough - Delayed;Change in Vital Signs;Suspected delayed Swallow    Nectar Thick Nectar Thick Liquid: Impaired Presentation: Cup Oral Phase Impairments: Poor awareness of bolus;Reduced lingual movement/coordination;Reduced labial seal Oral phase functional implications: Left anterior spillage Pharyngeal Phase Impairments:  Cough - Delayed;Change in Vital Signs;Wet Vocal Quality;Suspected delayed Swallow   Honey Thick Honey Thick Liquid: Not tested   Puree Puree: Within functional limits Presentation: Spoon   Solid   GO   Solid: Not tested       Rondel Baton, MS CF-SLP Speech-Language Pathologist [phone]  Arlana Lindau 12/12/2016,4:13 PM

## 2016-12-12 NOTE — Progress Notes (Addendum)
Patient ID: Devin Heath, male   DOB: [birth year], 83 y.o.   MRN: [id number]    PROGRESS NOTE    Devin Heath  YPP:[id number] DOB: 1929-04-17 DOA: 12/10/2016  PCP: [name], MD   Brief Narrative:  83 y.o. male with medical history significant of A. fib, osteoarthritis, CAD, diastolic dysfunction, COPD, type 2 diabetes, gout, hyperlipidemia, IBS, obstructive sleep apnea on CPAP, renal insufficiency, rheumatoid arthritis, restless leg syndrome who was recently discharged from the hospital due to GI bleed secondary to anticoagulation for atrial fibrillation, now brought from nursing home facility for evaluation of altered mental status and fever for the past 2 days. The patient was receiving ceftriaxone through [facility] line at the [facility].  ED Course: The patient received 2 L of normal saline bolus, vancomycin and Zosyn in the ED. Work up showed WBC of 10.4, hemoglobin level of 7.8 g/dL, platelet of 458. BMP showed BUN 28, creatinine 1.95 and glucose 149 mg/dL. His second lactic acid level was 2.46 mmol/L. urine analysis did not show signs of UTI, chest radiograph showed right lower quadrant infiltrate.  Assessment & Plan: Sepsis due to HCAP (healthcare-associated pneumonia) - pt with tachycardia and tachypnea with elevated lactic acid  - right infrahilar area, unknown pathogen  - continue vancomycin and zosyn day #3 as pt still with fevers 100.5 F - follow up on blood cultures - repeat CT chest also notable for bilateral pleural effusions  - repeat lactic acid in AM  Acute hypoxic respiratory failure secondary to HCAP and acute on chronic diastolic CHF - ABX as noted above - also added Lasix 40 mg IV BID for acute diastolic CHF - monitor daily weights, strict I/O - weight trend since admission 195 --> 196 lbs - ECHO requested for clearer evaluation   Atrial fibrillation with rapid ventricular response (HCC) - CHA2DS2-VASc Score of at least 4 - started Cardizem drip, also  added Metoprolol as needed for now - not AC due to recent significant GI bleed   Acute on chronic renal injury stage III - IVF provided but have to be stopped due to pleural effusions  - Cr is trending down  - review of records indicated most recent Cr in 1.8 - 2.1 range  - repeat BMP in AM  Hyperlipidemia - Hold statin for now  CAD (coronary artery disease) - Not on antiplatelet agent or anticoagulation due to recent GI bleed.  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  Anemia of chronic disease and recent GI bleed - transfuse for Hg < 7.5 - CBC In AM  Obesity  - Body mass index is 30.77 kg/m.   DVT prophylaxis: SCD's Code Status: DNR Family Communication: Patient at bedside, wife over the phone updated  Disposition Plan: to be determined   Consultants:   None  Procedures:   None  Antimicrobials:   Vancomycin 1/18 -->  Zosyn 1/19 -->  Subjective: No events overnight, on BiPAP this AM.   Objective: Vitals:   12/12/16 0719 12/12/16 0735 12/12/16 1145 12/12/16 1616  BP: (!) 93/55 (!) 104/53 117/85 111/65  Pulse: 100 98 (!) 103 (!) 112  Resp: (!) 22  (!) 21 (!) 31  Temp:   98.3 F (36.8 C) 98.2 F (36.8 C)  TempSrc:   Axillary Oral  SpO2: 98% 96% 100% 100%  Weight:  Height:        Intake/Output Summary (Last 24 hours) at 12/12/16 1917 Last data filed at 12/12/16 1600  Gross per 24 hour  Intake          1476.83 ml  Output                0 ml  Net          1476.83 ml   Filed Weights   12/10/16 1916 12/11/16 0137  Weight: 88.5 kg (195 lb) 89.1 kg (196 lb 6.9 oz)    Examination:  General exam: Appears somnolent but on BiPAP Respiratory system: rhonchi at bases with diminished air movement bilaterally  Cardiovascular system: IRRR. No JVD, murmurs, rubs,  gallops or clicks. No pedal edema. Gastrointestinal system: Abdomen is nondistended, soft and nontender. No organomegaly or masses felt.  Central nervous system: somnolent but moving all 4 extremities spontaneously   Data Reviewed: I have personally reviewed following labs and imaging studies  CBC:  Recent Labs Lab 12/10/16 2026 12/11/16 0203 12/11/16 0822 12/12/16 0438  WBC 10.4  --  9.7 8.5  NEUTROABS 8.8*  --  8.4* 7.6  HGB 7.8* 8.0* 7.6* 7.6*  HCT 26.1*  --  25.3* 25.2*  MCV 87.6  --  88.5 87.5  PLT 152  --  154 173   Basic Metabolic Panel:  Recent Labs Lab 12/10/16 2026 12/10/16 2329 12/11/16 0822 12/12/16 0438  NA 141  --  145 146*  K 4.3  --  4.4 3.9  CL 106  --  112* 115*  CO2 24  --  23 23  GLUCOSE 149*  --  208* 185*  BUN 28*  --  28* 25*  CREATININE 1.95*  --  2.14* 1.70*  CALCIUM 8.2*  --  8.1* 8.2*  MG  --  1.6*  --  2.6*  PHOS  --  3.5  --   --    Liver Function Tests:  Recent Labs Lab 12/10/16 2026 12/11/16 0822  AST 17 19  ALT 13* 12*  ALKPHOS 58 66  BILITOT 0.3 0.7  PROT 5.5* 5.3*  ALBUMIN 2.2* 2.2*    Recent Labs Lab 12/10/16 2306 12/12/16 1420  AMMONIA 30 30   Coagulation Profile:  Recent Labs Lab 12/11/16 0203  INR 1.18   CBG:  Recent Labs Lab 12/11/16 1640 12/11/16 2156 12/12/16 0717 12/12/16 1147 12/12/16 1615  GLUCAP 233* 177* 181* 171* 173*   Urine analysis:    Component Value Date/Time   COLORURINE YELLOW 12/10/2016 2137   APPEARANCEUR HAZY (A) 12/10/2016 2137   LABSPEC 1.012 12/10/2016 2137   PHURINE 5.0 12/10/2016 2137   GLUCOSEU NEGATIVE 12/10/2016 2137   HGBUR NEGATIVE 12/10/2016 2137   BILIRUBINUR NEGATIVE 12/10/2016 2137   KETONESUR NEGATIVE 12/10/2016 2137   PROTEINUR NEGATIVE 12/10/2016 2137   NITRITE NEGATIVE 12/10/2016 2137   LEUKOCYTESUR TRACE (A) 12/10/2016 2137   Recent Results (from the past 240 hour(s))  Respiratory Panel by PCR     Status: None   Collection Time: 12/10/16  7:58 PM    Result Value Ref Range Status   Adenovirus NOT DETECTED NOT DETECTED Final   Coronavirus 229E NOT DETECTED NOT DETECTED Final   Coronavirus HKU1 NOT DETECTED NOT DETECTED Final   Coronavirus NL63 NOT DETECTED NOT DETECTED Final   Coronavirus OC43 NOT DETECTED NOT DETECTED Final   Metapneumovirus NOT DETECTED NOT DETECTED Final   Rhinovirus / Enterovirus NOT DETECTED NOT DETECTED Final   Influenza A NOT  DETECTED NOT DETECTED Final   Influenza B NOT DETECTED NOT DETECTED Final   Parainfluenza Virus 1 NOT DETECTED NOT DETECTED Final   Parainfluenza Virus 2 NOT DETECTED NOT DETECTED Final   Parainfluenza Virus 3 NOT DETECTED NOT DETECTED Final   Parainfluenza Virus 4 NOT DETECTED NOT DETECTED Final   Respiratory Syncytial Virus NOT DETECTED NOT DETECTED Final   Bordetella pertussis NOT DETECTED NOT DETECTED Final   Chlamydophila pneumoniae NOT DETECTED NOT DETECTED Final   Mycoplasma pneumoniae NOT DETECTED NOT DETECTED Final  Blood Culture (routine x 2)     Status: None (Preliminary result)   Collection Time: 12/10/16  8:18 PM  Result Value Ref Range Status   Specimen Description BLOOD LEFT HAND  Final   Special Requests IN PEDIATRIC BOTTLE  Final   Culture  Setup Time   Final    GRAM POSITIVE COCCI IN CLUSTERS IN PEDIATRIC BOTTLE CRITICAL RESULT CALLED TO, READ BACK BY AND VERIFIED WITH: A ANDERSON 12/12/16 @ 0807 M VESTAL    Culture GRAM POSITIVE COCCI  Final   Report Status PENDING  Incomplete  Blood Culture ID Panel (Reflexed)     Status: Abnormal   Collection Time: 12/10/16  8:18 PM  Result Value Ref Range Status   Enterococcus species NOT DETECTED NOT DETECTED Final   Listeria monocytogenes NOT DETECTED NOT DETECTED Final   Staphylococcus species DETECTED (A) NOT DETECTED Final    Comment: CRITICAL RESULT CALLED TO, READ BACK BY AND VERIFIED WITH: A ANDERSON 12/12/16 @ 0807 M VESTAL    Staphylococcus aureus NOT DETECTED NOT DETECTED Final   Methicillin resistance NOT  DETECTED NOT DETECTED Final   Streptococcus species NOT DETECTED NOT DETECTED Final   Streptococcus agalactiae NOT DETECTED NOT DETECTED Final   Streptococcus pneumoniae NOT DETECTED NOT DETECTED Final   Streptococcus pyogenes NOT DETECTED NOT DETECTED Final   Acinetobacter baumannii NOT DETECTED NOT DETECTED Final   Enterobacteriaceae species NOT DETECTED NOT DETECTED Final   Enterobacter cloacae complex NOT DETECTED NOT DETECTED Final   Escherichia coli NOT DETECTED NOT DETECTED Final   Klebsiella oxytoca NOT DETECTED NOT DETECTED Final   Klebsiella pneumoniae NOT DETECTED NOT DETECTED Final   Proteus species NOT DETECTED NOT DETECTED Final   Serratia marcescens NOT DETECTED NOT DETECTED Final   Haemophilus influenzae NOT DETECTED NOT DETECTED Final   Neisseria meningitidis NOT DETECTED NOT DETECTED Final   Pseudomonas aeruginosa NOT DETECTED NOT DETECTED Final   Candida albicans NOT DETECTED NOT DETECTED Final   Candida glabrata NOT DETECTED NOT DETECTED Final   Candida krusei NOT DETECTED NOT DETECTED Final   Candida parapsilosis NOT DETECTED NOT DETECTED Final   Candida tropicalis NOT DETECTED NOT DETECTED Final  Urine culture     Status: None   Collection Time: 12/10/16  9:37 PM  Result Value Ref Range Status   Specimen Description URINE, CATHETERIZED  Final   Special Requests NONE  Final   Culture NO GROWTH  Final   Report Status 12/12/2016 FINAL  Final  Blood Culture (routine x 2)     Status: None (Preliminary result)   Collection Time: 12/10/16 11:08 PM  Result Value Ref Range Status   Specimen Description BLOOD LEFT HAND  Final   Special Requests IN PEDIATRIC BOTTLE  Final   Culture NO GROWTH 1 DAY  Final   Report Status PENDING  Incomplete    Radiology Studies: Ct Head Wo Contrast  Result Date: 12/10/2016 CLINICAL DATA:  Fever and hypotension, slurred  speech, suspect sepsis. Assess for CVA. EXAM: CT HEAD WITHOUT CONTRAST TECHNIQUE: Contiguous axial images were  obtained from the base of the skull through the vertex without intravenous contrast. COMPARISON:  None. FINDINGS: Brain: There is generalized age related parenchymal atrophy with commensurate dilatation of the ventricles and sulci. Mild chronic small vessel ischemic changes noted within the deep periventricular white matter regions bilaterally. There is no mass, hemorrhage, edema or other evidence of acute parenchymal abnormality. No extra-axial hemorrhage. Vascular: There are chronic calcified atherosclerotic changes of the large vessels at the skull base. No unexpected hyperdense vessel. Skull: Normal. Negative for fracture or focal lesion. Sinuses/Orbits: Extensive chronic appearing mucosal thickening throughout the bilateral maxillary sinuses. Orbital/periorbital soft tissues are unremarkable. Other: None. IMPRESSION: 1. No acute findings.  No intracranial mass, hemorrhage or edema. 2. Chronic appearing paranasal sinus disease. Electronically Signed   By: Bary Richard M.D.   On: 12/10/2016 21:23   Ct Chest Wo Contrast  Result Date: 12/12/2016 CLINICAL DATA:  Sepsis.  Abnormal chest radiograph. EXAM: CT CHEST WITHOUT CONTRAST TECHNIQUE: Multidetector CT imaging of the chest was performed following the standard protocol without IV contrast. COMPARISON:  Chest radiograph from 12/10/2016 FINDINGS: Cardiovascular: The heart size is mildly enlarged. There is aortic atherosclerosis identified. Previous median sternotomy and CABG procedure. Mediastinum/Nodes: The trachea appears patent and is midline. Normal appearance of the esophagus. Calcified mediastinal and hilar lymph nodes are identified compatible with prior granulomatous disease. Noncalcified left paratracheal lymph node measures 1.2 cm, image 50 of series 201. Noncalcified right paratracheal lymph node measures 1.2 cm, image 52 of series 201. Lungs/Pleura: Diffuse bronchial wall thickening. There are moderate bilateral pleural effusions. There is mild  overlying compressive type atelectasis. Interlobular septal thickening within the lung bases compatible with mild edema. A few clustered non-specific pulmonary nodules noted in the left lower lobe noted measure up to 7 mm, image 67 series 205. Upper Abdomen: No acute abnormality. Musculoskeletal: No chest wall mass or suspicious bone lesions identified. IMPRESSION: 1. No evidence for pneumonia. 2. Mild cardiac enlargement, aortic atherosclerosis, mild edema and bilateral pleural effusions. Suspected congestive heart failure. 3. Prior granulomatous disease. 4. Small nonspecific nodules clustered in the left lower lobe. Non-contrast chest CT at 3-6 months is recommended. If the nodules are stable at time of repeat CT, then future CT at 18-24 months (from today's scan) is considered optional for low-risk patients, but is recommended for high-risk patients. This recommendation follows the consensus statement: Guidelines for Management of Incidental Pulmonary Nodules Detected on CT Images:From the Fleischner Society 2017; published online before print (10.1148/[id number]). Electronically Signed   By: Signa Kell M.D.   On: 12/12/2016 09:39   Dg Chest Portable 1 View  Result Date: 12/10/2016 CLINICAL DATA:  Fever EXAM: PORTABLE CHEST 1 VIEW COMPARISON:  11/25/2016 FINDINGS: Median sternotomy wires are again evident. A right upper extremity catheter tip is difficult to visualize, catheter tubing is seen to the level of SVC. Mild atelectasis or infiltrate at the right lung base. No effusion. Stable cardiomediastinal silhouette. No pneumothorax. IMPRESSION: Mild hazy atelectasis or infiltrate at the right infrahilar lung. Stable borderline to mild cardiomegaly without overt failure. Electronically Signed   By: Jasmine Pang M.D.   On: 12/10/2016 23:18    Scheduled Meds: . chlorhexidine  15 mL Mouth Rinse BID  . ferrous sulfate  325 mg Oral BID WC  . furosemide  40 mg Intravenous BID  . insulin aspart   0-15 Units Subcutaneous TID WC  . mouth rinse  15 mL Mouth Rinse q12n4p  . pantoprazole (PROTONIX) IV  40 mg Intravenous Q12H  . piperacillin-tazobactam (ZOSYN)  IV  3.375 g Intravenous Q8H  . vancomycin  1,000 mg Intravenous Q24H   Continuous Infusions: . diltiazem (CARDIZEM) infusion 5 mg/hr (12/12/16 0718)     LOS: 2 days   Time spent: 20 minutes   Debbora Presto, MD [facility] Pager [phone]  If 7PM-7AM, please contact night-coverage [email or web address] Password [email or web address] 12/12/2016, 7:17 PM

## 2016-12-12 NOTE — Progress Notes (Signed)
Patient appears to have a rash on his upper back crossing to his right shoulder blade with one area that has a white puss appearance.  Attempted to page infectious disease due to patients history of shingles currently no one on service.  Paged Dr. Izola Price awaiting call back.  Will continue to monitor.

## 2016-12-12 NOTE — Progress Notes (Signed)
Pt HR 130-150's going up to 170's nonsustained. MD paged. MD called back and stated she would place orders. Will continue to monitor.

## 2016-12-12 NOTE — Progress Notes (Signed)
PHARMACY - PHYSICIAN COMMUNICATION CRITICAL VALUE ALERT - BLOOD CULTURE IDENTIFICATION (BCID)  Results for orders placed or performed during the hospital encounter of 12/10/16  Blood Culture ID Panel (Reflexed) (Collected: 12/10/2016  8:18 PM)  Result Value Ref Range   Enterococcus species NOT DETECTED NOT DETECTED   Listeria monocytogenes NOT DETECTED NOT DETECTED   Staphylococcus species DETECTED (A) NOT DETECTED   Staphylococcus aureus NOT DETECTED NOT DETECTED   Methicillin resistance NOT DETECTED NOT DETECTED   Streptococcus species NOT DETECTED NOT DETECTED   Streptococcus agalactiae NOT DETECTED NOT DETECTED   Streptococcus pneumoniae NOT DETECTED NOT DETECTED   Streptococcus pyogenes NOT DETECTED NOT DETECTED   Acinetobacter baumannii NOT DETECTED NOT DETECTED   Enterobacteriaceae species NOT DETECTED NOT DETECTED   Enterobacter cloacae complex NOT DETECTED NOT DETECTED   Escherichia coli NOT DETECTED NOT DETECTED   Klebsiella oxytoca NOT DETECTED NOT DETECTED   Klebsiella pneumoniae NOT DETECTED NOT DETECTED   Proteus species NOT DETECTED NOT DETECTED   Serratia marcescens NOT DETECTED NOT DETECTED   Haemophilus influenzae NOT DETECTED NOT DETECTED   Neisseria meningitidis NOT DETECTED NOT DETECTED   Pseudomonas aeruginosa NOT DETECTED NOT DETECTED   Candida albicans NOT DETECTED NOT DETECTED   Candida glabrata NOT DETECTED NOT DETECTED   Candida krusei NOT DETECTED NOT DETECTED   Candida parapsilosis NOT DETECTED NOT DETECTED   Candida tropicalis NOT DETECTED NOT DETECTED    Name of physician (or Provider) Contacted: Dr. Denna Haggard (paged at [phone], returned page at [phone])  Changes to prescribed antibiotics required: Patient is on Vanc/Zosyn for sepsis possibly due to HCAP. Although this is likely a contaminant, patient is still febrile and hypotensive. No changes in antibiotics at this time.   Allie Bossier, PharmD PGY1 Pharmacy Resident [phone] (Pager) 12/12/2016  8:08 AM

## 2016-12-12 NOTE — Evaluation (Signed)
Physical Therapy Evaluation Patient Details Name: Devin Heath MRN: [id number] DOB: [birth year] Today's Date: 12/12/2016   History of Present Illness  Pt is an 83 y/o male admitted from a "nursing home facility" secondary to AMS and HCAP. PMH including but not limited to COPD, DM, a-fib and CAD.  Clinical Impression  Pt presented supine in bed with HOB elevated, awake and willing to participate in therapy session. No caregivers or family members present throughout evaluation and pt was unable to provide any information regarding PLOF. Pt currently requires total A to roll bilaterally. Pt very limited this session secondary to fatigue, pain in bilateral LEs and lethargy. Pt would continue to benefit from skilled physical therapy services at this time while admitted and after d/c to address his below listed limitations in order to improve his overall safety and independence with functional mobility.      Follow Up Recommendations SNF;Supervision/Assistance - 24 hour    Equipment Recommendations  None recommended by PT    Recommendations for Other Services       Precautions / Restrictions Precautions Precautions: Fall Restrictions Weight Bearing Restrictions: No      Mobility  Bed Mobility Overal bed mobility: Needs Assistance Bed Mobility: Rolling Rolling: Total assist         General bed mobility comments: pt required increased time, hand-over-hand assist to position bilateral hands on bed rails to assist with rolling. Max A provided at trunk and hips with use of bed pads.  Transfers                 General transfer comment: unable to perform at this time as pt was total A for rolling and not safe with only one person to assist  Ambulation/Gait                Stairs            Wheelchair Mobility    Modified Rankin (Stroke Patients Only)       Balance                                             Pertinent Vitals/Pain  Pain Assessment: Faces Faces Pain Scale: Hurts even more Pain Location: bilateral knees Pain Descriptors / Indicators: Sore;Moaning Pain Intervention(s): Monitored during session;Repositioned    Home Living Family/patient expects to be discharged to:: Skilled nursing facility                      Prior Function Level of Independence: Needs assistance         Comments: No caregiver or family members present during evaluation. Pt was unable to provide information regarding PLOF.     Hand Dominance        Extremity/Trunk Assessment   Upper Extremity Assessment Upper Extremity Assessment: Generalized weakness    Lower Extremity Assessment Lower Extremity Assessment: Generalized weakness;RLE deficits/detail;LLE deficits/detail RLE: Unable to fully assess due to pain LLE: Unable to fully assess due to pain       Communication   Communication: HOH  Cognition Arousal/Alertness: Awake/alert Behavior During Therapy: Flat affect Overall Cognitive Status: No family/caregiver present to determine baseline cognitive functioning Area of Impairment: Orientation;Memory;Following commands;Problem solving Orientation Level: Disoriented to;Place;Situation   Memory: Decreased short-term memory Following Commands: Follows one step commands consistently     Problem Solving: Slow processing;Decreased initiation;Difficulty sequencing;Requires verbal cues;Requires tactile  cues      General Comments      Exercises     Assessment/Plan    PT Assessment Patient needs continued PT services  PT Problem List Decreased strength;Decreased range of motion;Decreased activity tolerance;Decreased balance;Decreased mobility;Decreased coordination;Decreased cognition;Decreased knowledge of use of DME;Decreased safety awareness;Pain          PT Treatment Interventions DME instruction;Gait training;Stair training;Functional mobility training;Therapeutic activities;Therapeutic  exercise;Balance training;Neuromuscular re-education;Cognitive remediation;Patient/family education    PT Goals (Current goals can be found in the Care Plan section)  Acute Rehab PT Goals Patient Stated Goal: none stated PT Goal Formulation: Patient unable to participate in goal setting Time For Goal Achievement: 12/26/16 Potential to Achieve Goals: Fair    Frequency Min 2X/week   Barriers to discharge        Co-evaluation               End of Session Equipment Utilized During Treatment: Oxygen Activity Tolerance: Patient limited by pain;Patient limited by lethargy;Patient limited by fatigue Patient left: in bed;with call bell/phone within reach;with bed alarm set Nurse Communication: Mobility status         Time: 1610-9604 PT Time Calculation (min) (ACUTE ONLY): 13 min   Charges:   PT Evaluation $PT Eval Moderate Complexity: 1 Procedure     PT G CodesAlessandra Bevels Devin Heath 12/12/2016, 2:49 PM Devin Heath, PT, DPT [phone]

## 2016-12-13 ENCOUNTER — Inpatient Hospital Stay (HOSPITAL_COMMUNITY): Payer: Medicare Other

## 2016-12-13 DIAGNOSIS — I509 Heart failure, unspecified: Secondary | ICD-10-CM

## 2016-12-13 LAB — GLUCOSE, CAPILLARY
GLUCOSE-CAPILLARY: 176 mg/dL — AB (ref 65–99)
Glucose-Capillary: 175 mg/dL — ABNORMAL HIGH (ref 65–99)
Glucose-Capillary: 204 mg/dL — ABNORMAL HIGH (ref 65–99)
Glucose-Capillary: 171 mg/dL — ABNORMAL HIGH (ref 65–99)

## 2016-12-13 LAB — CBC WITH DIFFERENTIAL/PLATELET
Basophils Absolute: 0 10*3/uL (ref 0.0–0.1)
Basophils Relative: 0 %
Eosinophils Absolute: 0.1 10*3/uL (ref 0.0–0.7)
Eosinophils Relative: 2 %
HEMATOCRIT: 25.4 % — AB (ref 39.0–52.0)
Hemoglobin: 7.7 g/dL — ABNORMAL LOW (ref 13.0–17.0)
LYMPHS ABS: 0.6 10*3/uL — AB (ref 0.7–4.0)
LYMPHS PCT: 9 %
MCH: 26.5 pg (ref 26.0–34.0)
MCHC: 30.3 g/dL (ref 30.0–36.0)
MCV: 87.3 fL (ref 78.0–100.0)
MONOS PCT: 5 %
Monocytes Absolute: 0.3 10*3/uL (ref 0.1–1.0)
NEUTROS ABS: 5.6 10*3/uL (ref 1.7–7.7)
NEUTROS PCT: 84 %
Platelets: 201 10*3/uL (ref 150–400)
RBC: 2.91 MIL/uL — ABNORMAL LOW (ref 4.22–5.81)
RDW: 15.6 % — ABNORMAL HIGH (ref 11.5–15.5)
WBC: 6.7 10*3/uL (ref 4.0–10.5)

## 2016-12-13 LAB — STREP PNEUMONIAE URINARY ANTIGEN: STREP PNEUMO URINARY ANTIGEN: NEGATIVE

## 2016-12-13 LAB — ECHOCARDIOGRAM COMPLETE
Height: 67 in
Weight: 3142.88 oz

## 2016-12-13 LAB — BASIC METABOLIC PANEL
Anion gap: 12 (ref 5–15)
BUN: 25 mg/dL — AB (ref 6–20)
CHLORIDE: 113 mmol/L — AB (ref 101–111)
CO2: 23 mmol/L (ref 22–32)
CREATININE: 1.79 mg/dL — AB (ref 0.61–1.24)
Calcium: 8.2 mg/dL — ABNORMAL LOW (ref 8.9–10.3)
GFR calc non Af Amer: 33 mL/min — ABNORMAL LOW (ref >60)
GFR, EST AFRICAN AMERICAN: 38 mL/min — AB (ref >60)
Glucose, Bld: 177 mg/dL — ABNORMAL HIGH (ref 65–99)
Potassium: 3.4 mmol/L — ABNORMAL LOW (ref 3.5–5.1)
Sodium: 148 mmol/L — ABNORMAL HIGH (ref 135–145)

## 2016-12-13 LAB — MAGNESIUM: Magnesium: 2.3 mg/dL (ref 1.7–2.4)

## 2016-12-13 MED ORDER — STARCH (THICKENING) PO POWD
ORAL | Status: DC | PRN
  Filled 2016-12-13: qty 227

## 2016-12-13 MED ORDER — RESOURCE THICKENUP CLEAR PO POWD
ORAL | Status: DC | PRN
  Filled 2016-12-13: qty 125

## 2016-12-13 NOTE — Progress Notes (Signed)
Speech Language Pathology Treatment: Dysphagia  Patient Details Name: Devin Heath MRN: [id number] DOB: Apr 18, 1929 Today's Date: 12/13/2016 Time: 3295-1884 SLP Time Calculation (min) (ACUTE ONLY): 12 min  Assessment / Plan / Recommendation Clinical Impression  Patient is alert and oriented to self and location, though still presents with some confusion ("Someone told me I'm in the hospital"). Appearance of oral cavity has improved. Patient elicits volitional swallow and cough upon command. Oral care performed. Administered PO trials of ice chips and thin liquids via teaspoon, cup and straw. Patient with minor anterior oral spillage with thin liquids via cup. Initially tolerates without overt signs of aspiration, however noted with multiple swallows and delayed cough as trials continued. These signs abated with nectar-thick liquids, administered in single sips via straw sips. Tolerated pureed solids without overt signs of aspiration and with no noted increase in respiratory rate or other changes in vital signs. Voice remains clear. Recommend initiating dysphagia 1 diet with nectar-thick liquids, medications crushed in puree. Provide full supervision and assistance with PO intake, monitor respiratory rate and level of alertness. Spoke with RN who is in agreement with this plan. SLP will follow for diet tolerance and to determine appropriateness for upgrade.    HPI HPI: 83 y.o.malewith medical history significant of A. fib, osteoarthritis, CAD, diastolic dysfunction, COPD, type 2 diabetes, gout, hyperlipidemia, IBS, obstructive sleep apnea on CPAP, renal insufficiency, rheumatoid arthritis, restless leg syndrome who was recently discharged from the hospital due to GI bleed secondary to anticoagulation for atrial fibrillation, now brought from nursing home facility for evaluation of altered mental status and fever for the past 2 days. Dx HCAP, acute on chronic renal insufficiency. CXR 12/10/16 impression:  Mild hazy atelectasis or infiltrate at the right infrahilar lung. Stable borderline to mild cardiomegaly without overt failure. Head CT 12/10/16 showed no acute findings.      SLP Plan  Continue with current plan of care     Recommendations  Diet recommendations: Nectar-thick liquid;Dysphagia 1 (puree) Liquids provided via: Straw (single sips, full supervision) Medication Administration: Crushed with puree Supervision: Full supervision/cueing for compensatory strategies;Staff to assist with self feeding Compensations: Slow rate;Small sips/bites Postural Changes and/or Swallow Maneuvers: Seated upright 90 degrees                Oral Care Recommendations: Oral care BID Follow up Recommendations: Skilled Nursing facility Plan: Continue with current plan of care       GO              Rondel Baton, MS CF-SLP Speech-Language Pathologist [phone]   Arlana Lindau 12/13/2016, 9:07 AM

## 2016-12-13 NOTE — Progress Notes (Signed)
Patient ID: Devin Heath, male   DOB: 1928-12-22, 83 y.o.   MRN: [id number]    PROGRESS NOTE  Devin Heath  [id number]:[id number] DOB: 1929/10/22 DOA: 12/10/2016  PCP: [name], MD   Brief Narrative:  83 y.o. male with medical history significant of A. fib, osteoarthritis, CAD, diastolic dysfunction, COPD, type 2 diabetes, gout, hyperlipidemia, IBS, obstructive sleep apnea on CPAP, renal insufficiency, rheumatoid arthritis, restless leg syndrome who was recently discharged from the hospital due to GI bleed secondary to anticoagulation for atrial fibrillation, now brought from nursing home facility for evaluation of altered mental status and fever for the past 2 days. The patient was receiving ceftriaxone through [facility] line at the [facility].  ED Course: The patient received 2 L of normal saline bolus, vancomycin and Zosyn in the ED. Work up showed WBC of 10.4, hemoglobin level of 7.8 g/dL, platelet of 867. BMP showed BUN 28, creatinine 1.95 and glucose 149 mg/dL. His second lactic acid level was 2.46 mmol/L. urine analysis did not show signs of UTI, chest radiograph showed right lower quadrant infiltrate.  Assessment & Plan: Sepsis due to HCAP (healthcare-associated pneumonia) - pt with tachycardia and tachypnea with elevated lactic acid  - right infrahilar area, unknown pathogen  - continue zosyn day #4 as pt still with fevers 100 F, stop vanc  - repeat CT chest also notable for bilateral pleural effusions  - lactic acid now cleared   Acute hypoxic respiratory failure secondary to HCAP and acute on chronic diastolic CHF - ABX as noted above - also added Lasix 40 mg IV BID for acute diastolic CHF - monitor daily weights, strict I/O - weight trend since admission 195 --> 196 lbs - ECHO requested for clearer evaluation   Hypernatremia - encouraged oral intake  - repeat SLP as pt more alert this AM  Atrial fibrillation with rapid ventricular response (HCC) - CHA2DS2-VASc Score of at  least 4 - started Cardizem drip, also added Metoprolol as needed for now - not AC due to recent significant GI bleed   Acute on chronic renal injury stage III - IVF provided but have to be stopped due to pleural effusions  - Cr is at baseline for now  - review of records indicated most recent Cr in 1.8 - 2.1 range  - repeat BMP in AM  Hyperlipidemia - Hold statin for now  CAD (coronary artery disease) - Not on antiplatelet agent or anticoagulation due to recent GI bleed.  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  Anemia of chronic disease and recent GI bleed - transfuse for Hg < 7.5 - CBC In AM  Obesity  - Body mass index is 30.77 kg/m.  DVT prophylaxis: SCD's Code Status: DNR Family Communication: Patient at bedside, wife over the phone updated  Disposition Plan: to be determined   Consultants:   None  Procedures:   None  Antimicrobials:   Vancomycin 1/18 --> 1/21  Zosyn 1/19 -->  Subjective: No events overnight, on BiPAP this AM.   Objective: Vitals:   12/13/16 1100 12/13/16 1200 12/13/16 1300 12/13/16 1500  BP: 135/70 (!) 124/47 125/66   Pulse: (!) 117 (!) 109 (!) 102   Resp: (!) 26 (!) 26 (!) 23   Temp: 98.7 F (37.1 C)   99.5 F (37.5 C)  TempSrc: Axillary   Axillary  SpO2: 94% 95% 96%   Weight:      Height:        Intake/Output Summary (Last 24 hours) at 12/13/16 1732 Last data filed at 12/13/16 0600  Gross per 24 hour  Intake              160 ml  Output                0 ml  Net              160 ml   Filed Weights   12/10/16 1916 12/11/16 0137  Weight: 88.5 kg (195 lb) 89.1 kg (196 lb 6.9 oz)    Examination:  General exam: Appears somnolent but on BiPAP Respiratory system: rhonchi at bases with diminished air  movement bilaterally  Cardiovascular system: IRRR. No JVD, murmurs, rubs, gallops or clicks. Upper extremity edema and minimal bilateral lower extremity pitting edema  Gastrointestinal system: Abdomen is nondistended, soft and nontender. No organomegaly or masses felt.  Central nervous system: somnolent but moving all 4 extremities spontaneously   Data Reviewed: I have personally reviewed following labs and imaging studies  CBC:  Recent Labs Lab 12/10/16 2026 12/11/16 0203 12/11/16 0822 12/12/16 0438 12/13/16 0600  WBC 10.4  --  9.7 8.5 6.7  NEUTROABS 8.8*  --  8.4* 7.6 5.6  HGB 7.8* 8.0* 7.6* 7.6* 7.7*  HCT 26.1*  --  25.3* 25.2* 25.4*  MCV 87.6  --  88.5 87.5 87.3  PLT 152  --  154 173 201   Basic Metabolic Panel:  Recent Labs Lab 12/10/16 2026 12/10/16 2329 12/11/16 0822 12/12/16 0438 12/13/16 0600  NA 141  --  145 146* 148*  K 4.3  --  4.4 3.9 3.4*  CL 106  --  112* 115* 113*  CO2 24  --  23 23 23   GLUCOSE 149*  --  208* 185* 177*  BUN 28*  --  28* 25* 25*  CREATININE 1.95*  --  2.14* 1.70* 1.79*  CALCIUM 8.2*  --  8.1* 8.2* 8.2*  MG  --  1.6*  --  2.6* 2.3  PHOS  --  3.5  --   --   --    Liver Function Tests:  Recent Labs Lab 12/10/16 2026 12/11/16 0822  AST 17 19  ALT 13* 12*  ALKPHOS 58 66  BILITOT 0.3 0.7  PROT 5.5* 5.3*  ALBUMIN 2.2* 2.2*    Recent Labs Lab 12/10/16 2306 12/12/16 1420  AMMONIA 30 30   Coagulation Profile:  Recent Labs Lab 12/11/16 0203  INR 1.18   CBG:  Recent Labs Lab 12/12/16 1615 12/12/16 2008 12/13/16 0836 12/13/16 1204 12/13/16 1712  GLUCAP 173* 179* 175* 176* 204*   Urine analysis:    Component Value Date/Time   COLORURINE YELLOW 12/10/2016 2137   APPEARANCEUR HAZY (A) 12/10/2016 2137   LABSPEC 1.012 12/10/2016 2137   PHURINE 5.0 12/10/2016 2137   GLUCOSEU NEGATIVE 12/10/2016 2137   HGBUR NEGATIVE 12/10/2016 2137   BILIRUBINUR NEGATIVE 12/10/2016 2137   KETONESUR NEGATIVE 12/10/2016 2137    PROTEINUR NEGATIVE 12/10/2016 2137   NITRITE NEGATIVE 12/10/2016 2137   LEUKOCYTESUR TRACE (A) 12/10/2016 2137   Recent Results (from the past 240 hour(s))  Respiratory Panel by PCR     Status: None   Collection Time: 12/10/16  7:58 PM  Result Value Ref Range Status   Adenovirus NOT DETECTED NOT DETECTED Final   Coronavirus 229E  NOT DETECTED NOT DETECTED Final   Coronavirus HKU1 NOT DETECTED NOT DETECTED Final   Coronavirus NL63 NOT DETECTED NOT DETECTED Final   Coronavirus OC43 NOT DETECTED NOT DETECTED Final   Metapneumovirus NOT DETECTED NOT DETECTED Final   Rhinovirus / Enterovirus NOT DETECTED NOT DETECTED Final   Influenza A NOT DETECTED NOT DETECTED Final   Influenza B NOT DETECTED NOT DETECTED Final   Parainfluenza Virus 1 NOT DETECTED NOT DETECTED Final   Parainfluenza Virus 2 NOT DETECTED NOT DETECTED Final   Parainfluenza Virus 3 NOT DETECTED NOT DETECTED Final   Parainfluenza Virus 4 NOT DETECTED NOT DETECTED Final   Respiratory Syncytial Virus NOT DETECTED NOT DETECTED Final   Bordetella pertussis NOT DETECTED NOT DETECTED Final   Chlamydophila pneumoniae NOT DETECTED NOT DETECTED Final   Mycoplasma pneumoniae NOT DETECTED NOT DETECTED Final  Blood Culture (routine x 2)     Status: Abnormal (Preliminary result)   Collection Time: 12/10/16  8:18 PM  Result Value Ref Range Status   Specimen Description BLOOD LEFT HAND  Final   Special Requests IN PEDIATRIC BOTTLE  Final   Culture  Setup Time   Final    GRAM POSITIVE COCCI IN CLUSTERS IN PEDIATRIC BOTTLE CRITICAL RESULT CALLED TO, READ BACK BY AND VERIFIED WITH: A ANDERSON 12/12/16 @ 0807 M VESTAL    Culture (A)  Final    STAPHYLOCOCCUS SPECIES (COAGULASE NEGATIVE) THE SIGNIFICANCE OF ISOLATING THIS ORGANISM FROM A SINGLE SET OF BLOOD CULTURES WHEN MULTIPLE SETS ARE DRAWN IS UNCERTAIN. PLEASE NOTIFY THE MICROBIOLOGY DEPARTMENT WITHIN ONE WEEK IF SPECIATION AND SENSITIVITIES ARE REQUIRED.    Report Status PENDING   Incomplete  Blood Culture ID Panel (Reflexed)     Status: Abnormal   Collection Time: 12/10/16  8:18 PM  Result Value Ref Range Status   Enterococcus species NOT DETECTED NOT DETECTED Final   Listeria monocytogenes NOT DETECTED NOT DETECTED Final   Staphylococcus species DETECTED (A) NOT DETECTED Final    Comment: CRITICAL RESULT CALLED TO, READ BACK BY AND VERIFIED WITH: A ANDERSON 12/12/16 @ 0807 M VESTAL    Staphylococcus aureus NOT DETECTED NOT DETECTED Final   Methicillin resistance NOT DETECTED NOT DETECTED Final   Streptococcus species NOT DETECTED NOT DETECTED Final   Streptococcus agalactiae NOT DETECTED NOT DETECTED Final   Streptococcus pneumoniae NOT DETECTED NOT DETECTED Final   Streptococcus pyogenes NOT DETECTED NOT DETECTED Final   Acinetobacter baumannii NOT DETECTED NOT DETECTED Final   Enterobacteriaceae species NOT DETECTED NOT DETECTED Final   Enterobacter cloacae complex NOT DETECTED NOT DETECTED Final   Escherichia coli NOT DETECTED NOT DETECTED Final   Klebsiella oxytoca NOT DETECTED NOT DETECTED Final   Klebsiella pneumoniae NOT DETECTED NOT DETECTED Final   Proteus species NOT DETECTED NOT DETECTED Final   Serratia marcescens NOT DETECTED NOT DETECTED Final   Haemophilus influenzae NOT DETECTED NOT DETECTED Final   Neisseria meningitidis NOT DETECTED NOT DETECTED Final   Pseudomonas aeruginosa NOT DETECTED NOT DETECTED Final   Candida albicans NOT DETECTED NOT DETECTED Final   Candida glabrata NOT DETECTED NOT DETECTED Final   Candida krusei NOT DETECTED NOT DETECTED Final   Candida parapsilosis NOT DETECTED NOT DETECTED Final   Candida tropicalis NOT DETECTED NOT DETECTED Final  Urine culture     Status: None   Collection Time: 12/10/16  9:37 PM  Result Value Ref Range Status   Specimen Description URINE, CATHETERIZED  Final   Special Requests NONE  Final  Culture NO GROWTH  Final   Report Status 12/12/2016 FINAL  Final  Blood Culture (routine x 2)      Status: None (Preliminary result)   Collection Time: 12/10/16 11:08 PM  Result Value Ref Range Status   Specimen Description BLOOD LEFT HAND  Final   Special Requests IN PEDIATRIC BOTTLE  Final   Culture NO GROWTH 2 DAYS  Final   Report Status PENDING  Incomplete    Radiology Studies: Ct Chest Wo Contrast  Result Date: 12/12/2016 CLINICAL DATA:  Sepsis.  Abnormal chest radiograph. EXAM: CT CHEST WITHOUT CONTRAST TECHNIQUE: Multidetector CT imaging of the chest was performed following the standard protocol without IV contrast. COMPARISON:  Chest radiograph from 12/10/2016 FINDINGS: Cardiovascular: The heart size is mildly enlarged. There is aortic atherosclerosis identified. Previous median sternotomy and CABG procedure. Mediastinum/Nodes: The trachea appears patent and is midline. Normal appearance of the esophagus. Calcified mediastinal and hilar lymph nodes are identified compatible with prior granulomatous disease. Noncalcified left paratracheal lymph node measures 1.2 cm, image 50 of series 201. Noncalcified right paratracheal lymph node measures 1.2 cm, image 52 of series 201. Lungs/Pleura: Diffuse bronchial wall thickening. There are moderate bilateral pleural effusions. There is mild overlying compressive type atelectasis. Interlobular septal thickening within the lung bases compatible with mild edema. A few clustered non-specific pulmonary nodules noted in the left lower lobe noted measure up to 7 mm, image 67 series 205. Upper Abdomen: No acute abnormality. Musculoskeletal: No chest wall mass or suspicious bone lesions identified. IMPRESSION: 1. No evidence for pneumonia. 2. Mild cardiac enlargement, aortic atherosclerosis, mild edema and bilateral pleural effusions. Suspected congestive heart failure. 3. Prior granulomatous disease. 4. Small nonspecific nodules clustered in the left lower lobe. Non-contrast chest CT at 3-6 months is recommended. If the nodules are stable at time of repeat  CT, then future CT at 18-24 months (from today's scan) is considered optional for low-risk patients, but is recommended for high-risk patients. This recommendation follows the consensus statement: Guidelines for Management of Incidental Pulmonary Nodules Detected on CT Images:From the Fleischner Society 2017; published online before print (10.1148/radiol.[id number]). Electronically Signed   By: Signa Kell M.D.   On: 12/12/2016 09:39    Scheduled Meds: . chlorhexidine  15 mL Mouth Rinse BID  . ferrous sulfate  325 mg Oral BID WC  . furosemide  40 mg Intravenous BID  . insulin aspart  0-15 Units Subcutaneous TID WC  . mouth rinse  15 mL Mouth Rinse q12n4p  . pantoprazole (PROTONIX) IV  40 mg Intravenous Q12H  . piperacillin-tazobactam (ZOSYN)  IV  3.375 g Intravenous Q8H  . vancomycin  1,000 mg Intravenous Q24H   Continuous Infusions: . diltiazem (CARDIZEM) infusion 5 mg/hr (12/13/16 1520)     LOS: 3 days   Time spent: 20 minutes   Debbora Presto, MD [facility] Pager [phone]  If 7PM-7AM, please contact night-coverage [email or web address] Password [facility] 12/13/2016, 5:32 PM

## 2016-12-13 NOTE — Progress Notes (Signed)
  Echocardiogram 2D Echocardiogram has been performed.  Leta Jungling M 12/13/2016, 11:21 AM

## 2016-12-13 NOTE — Progress Notes (Signed)
Patient placed on CPAP and is tolerating well at this time. Will monitor as needed.

## 2016-12-14 DIAGNOSIS — I5021 Acute systolic (congestive) heart failure: Secondary | ICD-10-CM

## 2016-12-14 DIAGNOSIS — I4891 Unspecified atrial fibrillation: Secondary | ICD-10-CM

## 2016-12-14 DIAGNOSIS — J189 Pneumonia, unspecified organism: Secondary | ICD-10-CM

## 2016-12-14 LAB — BASIC METABOLIC PANEL
ANION GAP: 10 (ref 5–15)
BUN: 19 mg/dL (ref 6–20)
CHLORIDE: 112 mmol/L — AB (ref 101–111)
CO2: 26 mmol/L (ref 22–32)
Calcium: 8.3 mg/dL — ABNORMAL LOW (ref 8.9–10.3)
Creatinine, Ser: 1.86 mg/dL — ABNORMAL HIGH (ref 0.61–1.24)
GFR calc Af Amer: 36 mL/min — ABNORMAL LOW (ref >60)
GFR, EST NON AFRICAN AMERICAN: 31 mL/min — AB (ref >60)
GLUCOSE: 181 mg/dL — AB (ref 65–99)
POTASSIUM: 3.2 mmol/L — AB (ref 3.5–5.1)
Sodium: 148 mmol/L — ABNORMAL HIGH (ref 135–145)

## 2016-12-14 LAB — CULTURE, BLOOD (ROUTINE X 2)

## 2016-12-14 LAB — GLUCOSE, CAPILLARY
GLUCOSE-CAPILLARY: 148 mg/dL — AB (ref 65–99)
GLUCOSE-CAPILLARY: 158 mg/dL — AB (ref 65–99)
Glucose-Capillary: 163 mg/dL — ABNORMAL HIGH (ref 65–99)
Glucose-Capillary: 180 mg/dL — ABNORMAL HIGH (ref 65–99)

## 2016-12-14 LAB — CBC
HCT: 27.9 % — ABNORMAL LOW (ref 39.0–52.0)
HEMOGLOBIN: 8.3 g/dL — AB (ref 13.0–17.0)
MCH: 25.9 pg — AB (ref 26.0–34.0)
MCHC: 29.7 g/dL — ABNORMAL LOW (ref 30.0–36.0)
MCV: 87.2 fL (ref 78.0–100.0)
PLATELETS: 250 10*3/uL (ref 150–400)
RBC: 3.2 MIL/uL — AB (ref 4.22–5.81)
RDW: 15.7 % — ABNORMAL HIGH (ref 11.5–15.5)
WBC: 7.7 10*3/uL (ref 4.0–10.5)

## 2016-12-14 MED ORDER — FUROSEMIDE 10 MG/ML IJ SOLN
40.0000 mg | Freq: Once | INTRAMUSCULAR | Status: AC
  Administered 2016-12-14: 40 mg via INTRAVENOUS
  Filled 2016-12-14: qty 4

## 2016-12-14 MED ORDER — FUROSEMIDE 10 MG/ML IJ SOLN
80.0000 mg | Freq: Two times a day (BID) | INTRAMUSCULAR | Status: DC
  Administered 2016-12-14 – 2016-12-15 (×3): 80 mg via INTRAVENOUS
  Filled 2016-12-14 (×3): qty 8

## 2016-12-14 MED ORDER — SODIUM CHLORIDE 0.9 % IV SOLN
30.0000 meq | Freq: Once | INTRAVENOUS | Status: AC
  Administered 2016-12-14: 30 meq via INTRAVENOUS
  Filled 2016-12-14: qty 15

## 2016-12-14 MED ORDER — POTASSIUM CHLORIDE CRYS ER 20 MEQ PO TBCR: 40.0000 meq | EXTENDED_RELEASE_TABLET | Freq: Once | ORAL | Status: DC

## 2016-12-14 NOTE — Care Management Important Message (Signed)
Important Message  Patient Details  Name: Devin Heath MRN: [id number] Date of Birth: 1928-11-25   Medicare Important Message Given:  Yes    [name] 12/14/2016, 1:50 PM

## 2016-12-14 NOTE — Progress Notes (Signed)
Notified respiratory therapy of new order for CVP monitoring. Therapist stated it would be a little bit for them to set up the pressure bag but will let us know when it's ready.

## 2016-12-14 NOTE — Care Management Note (Signed)
Case Management Note  Patient Details  Name: Devin Heath MRN: [id number] Date of Birth: Mar 12, 1929  Subjective/Objective:   Patient is a recent admit with GIB secondary to anticiagulation for afib, he is from a SNF, presenting with on this admission, AMS and Fever , HCAP, complicated by afib with rvr and acute/chronic heart failure.  Conts on cardizem drip , iv lasix, cvp monitoring. Plan is for SNF, CSW referral. NCM will cont to follow for dc needs.                 Action/Plan:   Expected Discharge Date:                  Expected Discharge Plan:  Skilled Nursing Facility  In-House Referral:  Clinical Social Work  Discharge planning Services  CM Consult  Post Acute Care Choice:    Choice offered to:     DME Arranged:    DME Agency:     HH Arranged:    HH Agency:     Status of Service:  Completed, signed off  If discussed at [employer], dates discussed:    Additional Comments:  Leone Haven, RN 12/14/2016, 8:22 PM

## 2016-12-14 NOTE — Consult Note (Signed)
Advanced Heart Failure Team Consult Note  Referring Physician: Dr Izola Price  Primary Physician: Primary Cardiologist:  Dr Margaretha Sheffield HF MD: Dr  Shirlee Latch  Reason for Consultation: Heart Failure   HPI:   Mr Reust is a 83 yo with history of permanent atrial fibrillation, CHF, CKD stage IV, and CAD s/p CABG 2010 followed by Dr Shirlee Latch in the HF clinic.    He was admitted in 09/2016.  He was sent to the ER by his rheumatologist with rise in creatinine and some generalized weakness.  Bumex was decreased in the hospital and he was sent home after about a day.  Of note, echo in 11/17 showed LV EF 60-65%.    Evaluated in the HF clinic 11/10/2016 and was volume overloaded. Bumex was stopped and torsemide 40/20 mg started. Also started on eliquis.  Weight at that time was 189 pounds.   He was admitted 11/21/16 with GI bleed. Hgb on admit was 8.6 so he received 1UPRBCs. GI consulted and he underwent EGD on 1/3 and colonoscopy on 1/4, capsule endoscopy was negative. Eliquis was stopped. Torsemide was cut back to 20 mg daily due to AKI.  Discharged to SNF.   Admitted with AMS and fever. Pertinent admission labs include WBC 10.4, Hgb 7.8, Plts 152, creatinine 1.95, and lactic acid 2.46. CXR with RLL infiltrate. Started on vanc and zosyn and gently hydrated. Blood CX obtained. UA was negative. Started on diltiazem drip for A fib RVR.   Yesterday diuresed with IV lasix 40 mg twice a day. Creatinine 1.79>1.86. ECHO this admit with EF 60-65%, peak PA pressure 70 mm hg. Weight up 1 pound.   Over night he was on CPAP. Confused this morning. Complaining of knee and ankle pain.    Review of Systems: [y] = yes, [ ]  = no . Obtained ROS from his wife and the chart. Confused.   General: Weight gain [ ] ; Weight loss [ ] ; Anorexia [ ] ; Fatigue [ Y]; Fever [ ] ; Chills [ ] ; Weakness [Y ]  Cardiac: Chest pain/pressure [ ] ; Resting SOB [ ] ; Exertional SOB [ ] ; Orthopnea [ ] ; Pedal Edema [Y ]; Palpitations [ ] ; Syncope [  ]; Presyncope [ ] ; Paroxysmal nocturnal dyspnea[ ]   Pulmonary: Cough [Y ]; Wheezing[ ] ; Hemoptysis[ ] ; Sputum [ ] ; Snoring [ ]   GI: Vomiting[ ] ; Dysphagia[ ] ; Melena[ ] ; Hematochezia [ ] ; Heartburn[ ] ; Abdominal pain [ ] ; Constipation [ ] ; Diarrhea [ ] ; BRBPR [ ]   GU: Hematuria[ ] ; Dysuria [ ] ; Nocturia[ ]   Vascular: Pain in legs with walking [ ] ; Pain in feet with lying flat [ ] ; Non-healing sores [ ] ; Stroke [ ] ; TIA [ ] ; Slurred speech [ ] ;  Neuro: Headaches[ ] ; Vertigo[ ] ; Seizures[ ] ; Paresthesias[ ] ;Blurred vision [ ] ; Diplopia [ ] ; Vision changes [ ]   Ortho/Skin: Arthritis [Y ]; Joint pain [ ] ; Muscle pain [ ] ; Joint swelling [ ] ; Back Pain [ ] ; Rash [ ]   Psych: Depression[ ] ; Anxiety[ ]   Heme: Bleeding problems [ ] ; Clotting disorders [ ] ; Anemia [ ]   Endocrine: Diabetes [Y ]; Thyroid dysfunction[ ]   Home Medications Prior to Admission medications   Medication Sig Start Date End Date Taking? Authorizing Provider  acetaminophen (TYLENOL) 500 MG tablet Take 500 mg by mouth every 6 (six) hours as needed for moderate pain.    Yes Historical Provider, MD  albuterol (PROVENTIL HFA;VENTOLIN HFA) 108 (90 Base) MCG/ACT inhaler Inhale 2 puffs into the lungs every 6 (six) hours as  needed for wheezing or shortness of breath.   Yes Historical Provider, MD  allopurinol (ZYLOPRIM) 100 MG tablet Take 100 mg by mouth daily.   Yes Historical Provider, MD  carvedilol (COREG) 25 MG tablet Take 25 mg by mouth 2 (two) times daily with a meal.   Yes Historical Provider, MD  cetirizine (ZYRTEC) 5 MG tablet Take 5 mg by mouth daily.    Yes Historical Provider, MD  Cholecalciferol (VITAMIN D3) 1000 units CAPS Take 1,000 Units by mouth daily.    Yes Historical Provider, MD  cholestyramine (QUESTRAN) 4 g packet Take 4 g by mouth 2 (two) times daily.   Yes Historical Provider, MD  ciprofloxacin (CIPRO IN D5W) 400 MG/200ML SOLN Inject 400 mg into the vein daily.   Yes Historical Provider, MD  Coenzyme Q10 (CO Q-10)  100 MG CAPS Take 100 mg by mouth daily.    Yes Historical Provider, MD  diphenhydrAMINE (ALLERGY RELIEF) 25 mg capsule Take 25 mg by mouth every 6 (six) hours as needed for itching or allergies.    Yes Historical Provider, MD  gabapentin (NEURONTIN) 300 MG capsule Take 300 mg by mouth 3 (three) times daily.   Yes Historical Provider, MD  HYDROcodone-acetaminophen (NORCO/VICODIN) 5-325 MG tablet Take 1 tablet by mouth every 8 (eight) hours as needed for moderate pain or severe pain. 12/01/16  Yes Jennifer Chahn-Yang Choi, DO  insulin glargine (LANTUS) 100 UNIT/ML injection Inject 10 Units into the skin at bedtime.   Yes Historical Provider, MD  insulin glulisine (APIDRA) 100 UNIT/ML injection Inject 14 Units into the skin 3 (three) times daily before meals.   Yes Historical Provider, MD  metroNIDAZOLE (FLAGYL) 5-0.79 MG/ML-% IVPB Inject 500 mg into the vein every 8 (eight) hours.   Yes Historical Provider, MD  mirtazapine (REMERON) 15 MG tablet Take 15 mg by mouth at bedtime.   Yes Historical Provider, MD  omeprazole (PRILOSEC) 40 MG capsule Take 1 capsule (40 mg total) by mouth daily. 12/01/16  Yes Jennifer Chahn-Yang Choi, DO  pravastatin (PRAVACHOL) 20 MG tablet Take 20 mg by mouth daily.   Yes Historical Provider, MD  promethazine (PHENERGAN) 25 MG tablet Take 25 mg by mouth every 6 (six) hours as needed for nausea or vomiting.   Yes Historical Provider, MD  terazosin (HYTRIN) 2 MG capsule Take 2 mg by mouth at bedtime.   Yes Historical Provider, MD  tiotropium (SPIRIVA) 18 MCG inhalation capsule Place 18 mcg into inhaler and inhale daily.   Yes Historical Provider, MD  torsemide (DEMADEX) 20 MG tablet Take 1 tablet (20 mg total) by mouth daily. 12/01/16  Yes Jennifer Chahn-Yang Choi, DO  umeclidinium bromide (INCRUSE ELLIPTA) 62.5 MCG/INH AEPB Inhale 1 puff into the lungs daily.   Yes Historical Provider, MD  valACYclovir (VALTREX) 1000 MG tablet Take 1,000 mg by mouth 2 (two) times daily.   Yes  Historical Provider, MD  pregabalin (LYRICA) 75 MG capsule Take 1 capsule (75 mg total) by mouth 2 (two) times daily. 12/01/16   Jordan Hawks, DO    Past Medical History: Past Medical History:  Diagnosis Date  . A-fib (HCC)   . Arthritis   . CAD (coronary artery disease)   . COPD (chronic obstructive pulmonary disease) (HCC)   . Diabetes (HCC)   . Former smoker   . Gout   . Hard of hearing   . Heart failure (HCC)   . Hyperlipidemia   . IBS (irritable bowel syndrome)   . Ischemic cardiomyopathy   .  On home oxygen therapy    "2L q hs" (10/13/2016)  . OSA on CPAP   . Renal insufficiency   . Rheumatoid arthritis (HCC)   . RLS (restless legs syndrome)   . Vitamin D deficiency     Past Surgical History: Past Surgical History:  Procedure Laterality Date  . APPENDECTOMY    . CARDIAC SURGERY     BY PASS  . COLONOSCOPY WITH PROPOFOL N/A 11/25/2016   Procedure: COLONOSCOPY WITH PROPOFOL;  Surgeon: Iva Boop, MD;  Location: [facility] ENDOSCOPY;  Service: Endoscopy;  Laterality: N/A;  . ESOPHAGOGASTRODUODENOSCOPY N/A 11/24/2016   Procedure: ESOPHAGOGASTRODUODENOSCOPY (EGD);  Surgeon: Iva Boop, MD;  Location: [facility] ENDOSCOPY;  Service: Endoscopy;  Laterality: N/A;  . FINGER AMPUTATION Left    RING FINGER  . GIVENS CAPSULE STUDY N/A 11/25/2016   Procedure: GIVENS CAPSULE STUDY;  Surgeon: Iva Boop, MD;  Location: [facility] ENDOSCOPY;  Service: Endoscopy;  Laterality: N/A;  . NECK SURGERY    . TONSILLECTOMY      Family History: Family History  Problem Relation Age of Onset  . CAD Mother   . CAD Father     Social History: Social History   Social History  . Marital status: Married    Spouse name: JEAN  . Number of children: 3  . Years of education: COLLEGE   Occupational History  . RETIRED    Social History Main Topics  . Smoking status: Former Games developer  . Smokeless tobacco: Never Used  . Alcohol use No  . Drug use: No  . Sexual activity: Not Asked   Other  Topics Concern  . None   Social History Narrative   Lives with with, just relocated to [place] 2 months ago per son.    Allergies:  Allergies  Allergen Reactions  . Ace Inhibitors Cough  . Ambrisentan Other (See Comments)    On MAR  . Clindamycin/Lincomycin Other (See Comments)    On MAR  . Erythromycin Other (See Comments)    On MAR  . Feldene [Piroxicam] Other (See Comments)    On MAR  . Keflex [Cephalexin] Other (See Comments)    On MAR  . Omnicef [Cefdinir] Other (See Comments)    On MAR  . Simvastatin Other (See Comments)    On MAR    Objective:    Vital Signs:   Temp:  [98.5 F (36.9 C)-99.5 F (37.5 C)] 98.9 F (37.2 C) (01/22 0819) Pulse Rate:  [93-117] 108 (01/22 0819) Resp:  [21-37] 23 (01/22 0819) BP: (100-149)/(47-91) 126/91 (01/22 0819) SpO2:  [92 %-100 %] 97 % (01/22 0819) Last BM Date: 12/13/16  Weight change: Filed Weights   12/10/16 1916 12/11/16 0137  Weight: 195 lb (88.5 kg) 196 lb 6.9 oz (89.1 kg)    Intake/Output:   Intake/Output Summary (Last 24 hours) at 12/14/16 1028 Last data filed at 12/14/16 0600  Gross per 24 hour  Intake              210 ml  Output                0 ml  Net              210 ml     Physical Exam: General:  Elderly.  NAD. Drowsy. Confused HEENT: normal Neck: supple. JVP to jaw . Carotids 2+ bilat; no bruits. No lymphadenopathy or thryomegaly appreciated. Cor: PMI nondisplaced. Irregular rate & rhythm. No rubs, gallops or murmurs. Lungs: Decreased in the  based.  Abdomen: obese. soft, nontender, nondistended. No hepatosplenomegaly. No bruits or masses. Good bowel sounds. Extremities: no cyanosis, clubbing, rash, RUE 2+ edema Lower extremities 2+ edema. SCDs in place.  Neuro: alert x1 to self.  cranial nerves grossly intact. Pain with lower extremity movement.   Telemetry: A fib 100s   Labs: Basic Metabolic Panel:  Recent Labs Lab 12/10/16 2026 12/10/16 2329 12/11/16 0822 12/12/16 0438 12/13/16 0600  12/14/16 0600  NA 141  --  145 146* 148* 148*  K 4.3  --  4.4 3.9 3.4* 3.2*  CL 106  --  112* 115* 113* 112*  CO2 24  --  23 23 23 26   GLUCOSE 149*  --  208* 185* 177* 181*  BUN 28*  --  28* 25* 25* 19  CREATININE 1.95*  --  2.14* 1.70* 1.79* 1.86*  CALCIUM 8.2*  --  8.1* 8.2* 8.2* 8.3*  MG  --  1.6*  --  2.6* 2.3  --   PHOS  --  3.5  --   --   --   --     Liver Function Tests:  Recent Labs Lab 12/10/16 2026 12/11/16 0822  AST 17 19  ALT 13* 12*  ALKPHOS 58 66  BILITOT 0.3 0.7  PROT 5.5* 5.3*  ALBUMIN 2.2* 2.2*   No results for input(s): LIPASE, AMYLASE in the last 168 hours.  Recent Labs Lab 12/10/16 2306 12/12/16 1420  AMMONIA 30 30    CBC:  Recent Labs Lab 12/10/16 2026 12/11/16 0203 12/11/16 0822 12/12/16 0438 12/13/16 0600 12/14/16 0600  WBC 10.4  --  9.7 8.5 6.7 7.7  NEUTROABS 8.8*  --  8.4* 7.6 5.6  --   HGB 7.8* 8.0* 7.6* 7.6* 7.7* 8.3*  HCT 26.1*  --  25.3* 25.2* 25.4* 27.9*  MCV 87.6  --  88.5 87.5 87.3 87.2  PLT 152  --  154 173 201 250    Cardiac Enzymes: No results for input(s): CKTOTAL, CKMB, CKMBINDEX, TROPONINI in the last 168 hours.  BNP: BNP (last 3 results)  Recent Labs  10/13/16 1320 11/09/16 1302  BNP 114.8* 79.9    ProBNP (last 3 results) No results for input(s): PROBNP in the last 8760 hours.   CBG:  Recent Labs Lab 12/13/16 0836 12/13/16 1204 12/13/16 1712 12/13/16 2103 12/14/16 0814  GLUCAP 175* 176* 204* 171* 163*    Coagulation Studies: No results for input(s): LABPROT, INR in the last 72 hours.  Other results: EKG: A Fib 98 bpm   Imaging:  No results found.   Medications:     Current Medications: . chlorhexidine  15 mL Mouth Rinse BID  . ferrous sulfate  325 mg Oral BID WC  . furosemide  40 mg Intravenous BID  . insulin aspart  0-15 Units Subcutaneous TID WC  . mouth rinse  15 mL Mouth Rinse q12n4p  . pantoprazole (PROTONIX) IV  40 mg Intravenous Q12H  . piperacillin-tazobactam (ZOSYN)   IV  3.375 g Intravenous Q8H     Infusions: . diltiazem (CARDIZEM) infusion 10 mg/hr (12/14/16 8003)      Assessment/Plan/ Discussion   Mr Caramanica is an 83 year old admitted with fever/fatigue in the setting of RLL HCAP complicated by A fib RVR and A/C diastolic heart failure. Followed in the HF clinic by Dr Shirlee Latch.   1. HCAP Initially on vanc and zosyn. Narrowed to zosyn 1/21.  12/10/2016 Blood CX- 1/2 Gram + Cocci from Left hand repeated No growth.  Urine CX- No growth . Afebrile WBC 7.7  Needs speech evaluation. Not sure he can safely swallow.  2. A/C Diastolic Heart Failure- ECHO 12/13/2016- EF 60-65% Peak PA pressure 70 mm hg.  Appears volume overloaded. Set up CVP. Had 40 mg IV lasix this morning. Give another 40 mg IV lasix now the 80 mg IV lasix twice a day. Follow renal function closely.  3. Anemia Hgb 8.3 . On iron and PPI. 4. Chronic A Fib RVR Continue diltiazem drip. Once infection improved hopefully rate will improve. PTA he was on carvedilol 25 mg twice a day. Not sure he can swallow so will need to hold off.  No anticoagulants due anemia and recent GI bleed. Hgb 8.3  5. CKD Stage IV - creatinine baseline 1.8-2.0  6. CAD: S/P CABG 2010 7.Pulmonary HTN  8. OSA- on CPAP nightly.  9.  DNR- likely need Palliative Care consult for GOC.  10. Disposition- SNF   Length of Stay: 4  Amy Clegg NP-C  12/14/2016, 10:28 AM  Advanced Heart Failure Team Pager [phone] (M-F; 7a - 4p)  Please contact [facility] for night-coverage after hours (4p -7a ) and weekends on [email or web address] Patient seen and examined with Tonye Becket, NP. We discussed all aspects of the encounter. I agree with the assessment and plan as stated above.   83 y/o male with chronic diastolic HF, chronic AF, CKD and recent GI bleed now admitted with URI and lactic acidosis. CXR suggestive of RML infiltrate but CT more consistent with acute bronchitis and CHF. On exam he is ill-appearing, confused and markedly  volume overloaded. Also on diltiazem gtt for RVR.   Will continue IV diuresis and diltiazem for RVR. Suspect rate will improve as overall condition stabilizes. Agree with broad spectrum abx. Off anticoagulation due to recent GI bleeding.   He has had progressive downward course for past several months and I discussed possible Palliative Care Consult with his wife and daughter-in-law which they seemed open to.   Rose-Marie Hickling,MD 1:43 PM

## 2016-12-14 NOTE — Progress Notes (Signed)
Patient ID: Devin Heath, male   DOB: August 16, 1929, 83 y.o.   MRN: [id number]    PROGRESS NOTE  Devin Heath  FVC:[id number] DOB: 05-09-1929 DOA: 12/10/2016  PCP: [name], MD   Brief Narrative:  83 y.o. male with medical history significant of A. fib, osteoarthritis, CAD, diastolic dysfunction, COPD, type 2 diabetes, gout, hyperlipidemia, IBS, obstructive sleep apnea on CPAP, renal insufficiency, rheumatoid arthritis, restless leg syndrome who was recently discharged from the hospital due to GI bleed secondary to anticoagulation for atrial fibrillation, now brought from nursing home facility for evaluation of altered mental status and fever for the past 2 days. The patient was receiving ceftriaxone through [facility] line at the [facility].  ED Course: The patient received 2 L of normal saline bolus, vancomycin and Zosyn in the ED. Work up showed WBC of 10.4, hemoglobin level of 7.8 g/dL, platelet of 638. BMP showed BUN 28, creatinine 1.95 and glucose 149 mg/dL. His second lactic acid level was 2.46 mmol/L. urine analysis did not show signs of UTI, chest radiograph showed right lower quadrant infiltrate.  Assessment & Plan: Sepsis due to HCAP (healthcare-associated pneumonia) - pt with tachycardia and tachypnea with elevated lactic acid  - right infrahilar area, unknown pathogen  - continue zosyn day #5 as pt still with fevers 100.3 F, stopped vanc 1/21 - repeat CT chest also notable for bilateral pleural effusions  - lactic acid now cleared   Acute hypoxic respiratory failure secondary to HCAP and acute on chronic diastolic CHF - ABX as noted above - also added Lasix 40 mg IV BID for acute diastolic CHF - weight trend since admission 195 --> 196 lbs - ECHO requested for clearer evaluation and with stable EF but difficult to assess diastolic function - pt still volume overloaded on exam - cardiology has been consulted and assistance appreciated   Hypernatremia - encouraged oral  intake - dys I diet recommended after SLP done   Atrial fibrillation with rapid ventricular response (HCC) - CHA2DS2-VASc Score of at least 4 - started Cardizem drip, also added Metoprolol as needed for now - not AC due to recent significant GI bleed   Acute on chronic renal injury stage III - IVF provided but have to be stopped due to pleural effusions  - Cr is at baseline for now but will need close monitoring while in IV Lasix  - review of records indicated most recent Cr in 1.8 - 2.1 range  - repeat BMP in AM  Hyperlipidemia - Hold statin for now  CAD (coronary artery disease) - Not on antiplatelet agent or anticoagulation due to recent GI bleed.  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  Anemia of chronic disease and recent GI bleed - transfuse for Hg < 7.5 - Hg so far stable  - CBC In AM  Obesity  - Body mass index is 30.77 kg/m.  DVT prophylaxis: SCD's Code Status: DNR Family Communication: Patient at bedside, no family at bedside this AM  Disposition Plan: to be determined   Consultants:   Cardiology   Procedures:   None  Antimicrobials:   Vancomycin 1/18 --> 1/21  Zosyn 1/19 -->  Subjective: No events overnight, on BiPAP this AM.   Objective: Vitals:   12/14/16 0819 12/14/16 1243 12/14/16 1506 12/14/16 1633  BP: (!) 126/91 129/62  Marland Kitchen)  111/54  Pulse: (!) 108 (!) 113 (!) 103 (!) 109  Resp: (!) 23 (!) 24 (!) 23 (!) 21  Temp: 98.9 F (37.2 C) 98.8 F (37.1 C)  99.1 F (37.3 C)  TempSrc: Axillary Axillary  Axillary  SpO2: 97% 100% 100% 100%  Weight:      Height:        Intake/Output Summary (Last 24 hours) at 12/14/16 1941 Last data filed at 12/14/16 1636  Gross per 24 hour  Intake           600.33 ml  Output              500 ml  Net            100.33 ml   Filed Weights   12/10/16 1916 12/11/16 0137  Weight: 88.5 kg (195 lb) 89.1 kg (196 lb 6.9 oz)    Examination:  General exam: Appears somnolent but on BiPAP Respiratory system: rhonchi at bases with diminished air movement bilaterally  Cardiovascular system: IRRR. No JVD, murmurs, rubs, gallops or clicks. Upper extremity edema and minimal bilateral lower extremity pitting edema  Gastrointestinal system: Abdomen is nondistended, soft and nontender. No organomegaly or masses felt.  Central nervous system: somnolent but moving all 4 extremities spontaneously   Data Reviewed: I have personally reviewed following labs and imaging studies  CBC:  Recent Labs Lab 12/10/16 2026 12/11/16 0203 12/11/16 0822 12/12/16 0438 12/13/16 0600 12/14/16 0600  WBC 10.4  --  9.7 8.5 6.7 7.7  NEUTROABS 8.8*  --  8.4* 7.6 5.6  --   HGB 7.8* 8.0* 7.6* 7.6* 7.7* 8.3*  HCT 26.1*  --  25.3* 25.2* 25.4* 27.9*  MCV 87.6  --  88.5 87.5 87.3 87.2  PLT 152  --  154 173 201 250   Basic Metabolic Panel:  Recent Labs Lab 12/10/16 2026 12/10/16 2329 12/11/16 0822 12/12/16 0438 12/13/16 0600 12/14/16 0600  NA 141  --  145 146* 148* 148*  K 4.3  --  4.4 3.9 3.4* 3.2*  CL 106  --  112* 115* 113* 112*  CO2 24  --  23 23 23 26   GLUCOSE 149*  --  208* 185* 177* 181*  BUN 28*  --  28* 25* 25* 19  CREATININE 1.95*  --  2.14* 1.70* 1.79* 1.86*  CALCIUM 8.2*  --  8.1* 8.2* 8.2* 8.3*  MG  --  1.6*  --  2.6* 2.3  --   PHOS  --  3.5  --   --   --   --    Liver Function Tests:  Recent Labs Lab 12/10/16 2026 12/11/16 0822  AST 17 19  ALT 13* 12*  ALKPHOS 58 66  BILITOT 0.3 0.7  PROT 5.5* 5.3*  ALBUMIN 2.2* 2.2*    Recent Labs Lab 12/10/16 2306 12/12/16 1420  AMMONIA 30 30   Coagulation Profile:  Recent Labs Lab 12/11/16 0203  INR 1.18   CBG:  Recent Labs Lab 12/13/16 1712 12/13/16 2103 12/14/16 0814 12/14/16 1241 12/14/16 1628  GLUCAP 204* 171* 163* 180* 148*    Urine analysis:    Component Value Date/Time   COLORURINE YELLOW 12/10/2016 2137   APPEARANCEUR HAZY (A) 12/10/2016 2137   LABSPEC 1.012 12/10/2016 2137   PHURINE 5.0 12/10/2016 2137   GLUCOSEU NEGATIVE 12/10/2016 2137   HGBUR NEGATIVE 12/10/2016 2137   BILIRUBINUR NEGATIVE 12/10/2016 2137   KETONESUR NEGATIVE 12/10/2016 2137   PROTEINUR NEGATIVE 12/10/2016 2137   NITRITE NEGATIVE  12/10/2016 2137   LEUKOCYTESUR TRACE (A) 12/10/2016 2137   Recent Results (from the past 240 hour(s))  Respiratory Panel by PCR     Status: None   Collection Time: 12/10/16  7:58 PM  Result Value Ref Range Status   Adenovirus NOT DETECTED NOT DETECTED Final   Coronavirus 229E NOT DETECTED NOT DETECTED Final   Coronavirus HKU1 NOT DETECTED NOT DETECTED Final   Coronavirus NL63 NOT DETECTED NOT DETECTED Final   Coronavirus OC43 NOT DETECTED NOT DETECTED Final   Metapneumovirus NOT DETECTED NOT DETECTED Final   Rhinovirus / Enterovirus NOT DETECTED NOT DETECTED Final   Influenza A NOT DETECTED NOT DETECTED Final   Influenza B NOT DETECTED NOT DETECTED Final   Parainfluenza Virus 1 NOT DETECTED NOT DETECTED Final   Parainfluenza Virus 2 NOT DETECTED NOT DETECTED Final   Parainfluenza Virus 3 NOT DETECTED NOT DETECTED Final   Parainfluenza Virus 4 NOT DETECTED NOT DETECTED Final   Respiratory Syncytial Virus NOT DETECTED NOT DETECTED Final   Bordetella pertussis NOT DETECTED NOT DETECTED Final   Chlamydophila pneumoniae NOT DETECTED NOT DETECTED Final   Mycoplasma pneumoniae NOT DETECTED NOT DETECTED Final  Blood Culture (routine x 2)     Status: Abnormal   Collection Time: 12/10/16  8:18 PM  Result Value Ref Range Status   Specimen Description BLOOD LEFT HAND  Final   Special Requests IN PEDIATRIC BOTTLE  Final   Culture  Setup Time   Final    GRAM POSITIVE COCCI IN CLUSTERS IN PEDIATRIC BOTTLE CRITICAL RESULT CALLED TO, READ BACK BY AND VERIFIED WITH: A ANDERSON 12/12/16 @ 0807 M VESTAL     Culture (A)  Final    STAPHYLOCOCCUS SPECIES (COAGULASE NEGATIVE) THE SIGNIFICANCE OF ISOLATING THIS ORGANISM FROM A SINGLE SET OF BLOOD CULTURES WHEN MULTIPLE SETS ARE DRAWN IS UNCERTAIN. PLEASE NOTIFY THE MICROBIOLOGY DEPARTMENT WITHIN ONE WEEK IF SPECIATION AND SENSITIVITIES ARE REQUIRED.    Report Status 12/14/2016 FINAL  Final  Blood Culture ID Panel (Reflexed)     Status: Abnormal   Collection Time: 12/10/16  8:18 PM  Result Value Ref Range Status   Enterococcus species NOT DETECTED NOT DETECTED Final   Listeria monocytogenes NOT DETECTED NOT DETECTED Final   Staphylococcus species DETECTED (A) NOT DETECTED Final    Comment: CRITICAL RESULT CALLED TO, READ BACK BY AND VERIFIED WITH: A ANDERSON 12/12/16 @ 0807 M VESTAL    Staphylococcus aureus NOT DETECTED NOT DETECTED Final   Methicillin resistance NOT DETECTED NOT DETECTED Final   Streptococcus species NOT DETECTED NOT DETECTED Final   Streptococcus agalactiae NOT DETECTED NOT DETECTED Final   Streptococcus pneumoniae NOT DETECTED NOT DETECTED Final   Streptococcus pyogenes NOT DETECTED NOT DETECTED Final   Acinetobacter baumannii NOT DETECTED NOT DETECTED Final   Enterobacteriaceae species NOT DETECTED NOT DETECTED Final   Enterobacter cloacae complex NOT DETECTED NOT DETECTED Final   Escherichia coli NOT DETECTED NOT DETECTED Final   Klebsiella oxytoca NOT DETECTED NOT DETECTED Final   Klebsiella pneumoniae NOT DETECTED NOT DETECTED Final   Proteus species NOT DETECTED NOT DETECTED Final   Serratia marcescens NOT DETECTED NOT DETECTED Final   Haemophilus influenzae NOT DETECTED NOT DETECTED Final   Neisseria meningitidis NOT DETECTED NOT DETECTED Final   Pseudomonas aeruginosa NOT DETECTED NOT DETECTED Final   Candida albicans NOT DETECTED NOT DETECTED Final   Candida glabrata NOT DETECTED NOT DETECTED Final   Candida krusei NOT DETECTED NOT DETECTED Final   Candida parapsilosis  NOT DETECTED NOT DETECTED Final   Candida  tropicalis NOT DETECTED NOT DETECTED Final  Urine culture     Status: None   Collection Time: 12/10/16  9:37 PM  Result Value Ref Range Status   Specimen Description URINE, CATHETERIZED  Final   Special Requests NONE  Final   Culture NO GROWTH  Final   Report Status 12/12/2016 FINAL  Final  Blood Culture (routine x 2)     Status: None (Preliminary result)   Collection Time: 12/10/16 11:08 PM  Result Value Ref Range Status   Specimen Description BLOOD LEFT HAND  Final   Special Requests IN PEDIATRIC BOTTLE  Final   Culture NO GROWTH 3 DAYS  Final   Report Status PENDING  Incomplete    Radiology Studies: No results found.  Scheduled Meds: . chlorhexidine  15 mL Mouth Rinse BID  . ferrous sulfate  325 mg Oral BID WC  . furosemide  80 mg Intravenous BID  . insulin aspart  0-15 Units Subcutaneous TID WC  . mouth rinse  15 mL Mouth Rinse q12n4p  . pantoprazole (PROTONIX) IV  40 mg Intravenous Q12H  . piperacillin-tazobactam (ZOSYN)  IV  3.375 g Intravenous Q8H   Continuous Infusions: . diltiazem (CARDIZEM) infusion 10 mg/hr (12/14/16 0956)     LOS: 4 days   Time spent: 20 minutes   Debbora Presto, MD [facility] Pager [phone]  If 7PM-7AM, please contact night-coverage [email or web address] Password [email or web address] 12/14/2016, 7:41 PM

## 2016-12-14 NOTE — Progress Notes (Signed)
Pharmacy Antibiotic Note  Devin Heath is a 83 y.o. male who continues on day # 4 zosyn for sepsis 2/2 HCAP. Vancomycin d/c'ed yesterday. Renal function overall stable.  Plan: 1) Continue zosyn 3.375g IV q8 (4 hour infusion) 2) Follow up LOT  Height: 5\' 7"  (170.2 cm) Weight: 196 lb 6.9 oz (89.1 kg) IBW/kg (Calculated) : 66.1  Temp (24hrs), Avg:99.1 F (37.3 C), Min:98.5 F (36.9 C), Max:99.5 F (37.5 C)   Recent Labs Lab 12/10/16 2026 12/10/16 2027 12/10/16 2318 12/11/16 0203 12/11/16 0822 12/12/16 0438 12/13/16 0600 12/14/16 0600  WBC 10.4  --   --   --  9.7 8.5 6.7 7.7  CREATININE 1.95*  --   --   --  2.14* 1.70* 1.79* 1.86*  LATICACIDVEN  --  1.61 2.46* 3.1*  --  1.2  --   --     Estimated Creatinine Clearance: 30.9 mL/min (by C-G formula based on SCr of 1.86 mg/dL (H)).    Allergies  Allergen Reactions  . Ace Inhibitors Cough  . Ambrisentan Other (See Comments)    On MAR  . Clindamycin/Lincomycin Other (See Comments)    On MAR  . Erythromycin Other (See Comments)    On MAR  . Feldene [Piroxicam] Other (See Comments)    On MAR  . Keflex [Cephalexin] Other (See Comments)    On MAR  . Omnicef [Cefdinir] Other (See Comments)    On MAR  . Simvastatin Other (See Comments)    On MAR    Antimicrobials this admission: Zosyn 1/18 >>  Vancomycin 1/18 >> 1/21  Dose adjustments this admission: n/a  Microbiology results: 1/18 BCx: 1/2 staph spp. 1/2 NG 1/18 UCx: NG 1/18 Resp Panel: negative 12/30 MRSA PCR: negative  Thank you for allowing pharmacy to be a part of this patient's care.  1/31 12/14/2016 12:56 PM

## 2016-12-15 DIAGNOSIS — I5033 Acute on chronic diastolic (congestive) heart failure: Secondary | ICD-10-CM

## 2016-12-15 DIAGNOSIS — N184 Chronic kidney disease, stage 4 (severe): Secondary | ICD-10-CM

## 2016-12-15 LAB — ALBUMIN: ALBUMIN: 2.2 g/dL — AB (ref 3.5–5.0)

## 2016-12-15 LAB — GLUCOSE, CAPILLARY
GLUCOSE-CAPILLARY: 173 mg/dL — AB (ref 65–99)
GLUCOSE-CAPILLARY: 147 mg/dL — AB (ref 65–99)
Glucose-Capillary: 166 mg/dL — ABNORMAL HIGH (ref 65–99)
Glucose-Capillary: 187 mg/dL — ABNORMAL HIGH (ref 65–99)

## 2016-12-15 LAB — BASIC METABOLIC PANEL
Anion gap: 11 (ref 5–15)
Anion gap: 11 (ref 5–15)
BUN: 18 mg/dL (ref 6–20)
BUN: 18 mg/dL (ref 6–20)
CHLORIDE: 111 mmol/L (ref 101–111)
CHLORIDE: 109 mmol/L (ref 101–111)
CO2: 29 mmol/L (ref 22–32)
CO2: 32 mmol/L (ref 22–32)
CREATININE: 1.89 mg/dL — AB (ref 0.61–1.24)
Calcium: 8.2 mg/dL — ABNORMAL LOW (ref 8.9–10.3)
Calcium: 8.6 mg/dL — ABNORMAL LOW (ref 8.9–10.3)
Creatinine, Ser: 1.89 mg/dL — ABNORMAL HIGH (ref 0.61–1.24)
GFR calc non Af Amer: 31 mL/min — ABNORMAL LOW (ref >60)
GFR calc non Af Amer: 31 mL/min — ABNORMAL LOW (ref >60)
GFR, EST AFRICAN AMERICAN: 36 mL/min — AB (ref >60)
GFR, EST AFRICAN AMERICAN: 36 mL/min — AB (ref >60)
GLUCOSE: 203 mg/dL — AB (ref 65–99)
Glucose, Bld: 176 mg/dL — ABNORMAL HIGH (ref 65–99)
POTASSIUM: 2.9 mmol/L — AB (ref 3.5–5.1)
Potassium: 3 mmol/L — ABNORMAL LOW (ref 3.5–5.1)
SODIUM: 152 mmol/L — AB (ref 135–145)
Sodium: 151 mmol/L — ABNORMAL HIGH (ref 135–145)

## 2016-12-15 LAB — CBC
HCT: 25.3 % — ABNORMAL LOW (ref 39.0–52.0)
Hemoglobin: 7.5 g/dL — ABNORMAL LOW (ref 13.0–17.0)
MCH: 26 pg (ref 26.0–34.0)
MCHC: 29.6 g/dL — AB (ref 30.0–36.0)
MCV: 87.5 fL (ref 78.0–100.0)
PLATELETS: 246 10*3/uL (ref 150–400)
RBC: 2.89 MIL/uL — AB (ref 4.22–5.81)
RDW: 15.9 % — ABNORMAL HIGH (ref 11.5–15.5)
WBC: 8 10*3/uL (ref 4.0–10.5)

## 2016-12-15 MED ORDER — POTASSIUM CHLORIDE CRYS ER 20 MEQ PO TBCR: 40.0000 meq | EXTENDED_RELEASE_TABLET | Freq: Once | ORAL | Status: DC

## 2016-12-15 MED ORDER — SODIUM CHLORIDE 0.9 % IV SOLN
30.0000 meq | Freq: Once | INTRAVENOUS | Status: AC
  Administered 2016-12-15: 30 meq via INTRAVENOUS
  Filled 2016-12-15: qty 15

## 2016-12-15 MED ORDER — POTASSIUM CHLORIDE 20 MEQ PO PACK
40.0000 meq | PACK | Freq: Once | ORAL | Status: DC
  Filled 2016-12-15: qty 2

## 2016-12-15 MED ORDER — ACETAMINOPHEN 325 MG PO TABS: 650.0000 mg | ORAL_TABLET | Freq: Four times a day (QID) | ORAL | Status: DC | PRN

## 2016-12-15 MED ORDER — PRAVASTATIN SODIUM 20 MG PO TABS
20.0000 mg | ORAL_TABLET | Freq: Every day | ORAL | Status: DC
  Administered 2016-12-15 – 2016-12-16 (×2): 20 mg via ORAL
  Filled 2016-12-15 (×4): qty 1

## 2016-12-15 MED ORDER — METOLAZONE 2.5 MG PO TABS
2.5000 mg | ORAL_TABLET | Freq: Once | ORAL | Status: AC
  Administered 2016-12-15: 2.5 mg via ORAL
  Filled 2016-12-15: qty 1

## 2016-12-15 MED ORDER — POTASSIUM CHLORIDE 20 MEQ/15ML (10%) PO SOLN
40.0000 meq | ORAL | Status: AC
  Administered 2016-12-15: 40 meq via ORAL
  Filled 2016-12-15: qty 30

## 2016-12-15 NOTE — Progress Notes (Signed)
Speech Language Pathology Treatment: Dysphagia  Patient Details Name: Devin Heath MRN: [id number] DOB: Oct 06, 1929 Today's Date: 12/15/2016 Time: 0240-9735 SLP Time Calculation (min) (ACUTE ONLY): 11 min  Assessment / Plan / Recommendation Clinical Impression  Devin Heath was asleep when SLP entered room, but was cooperative and alert once awoken, although he complained of bilateral knee pain and appeared generally deconditioned throughout the tx session. Pt exhibited characteristics of pharyngeal dysphagia with s/s of aspiration such as multiple swallows and delayed coughing (2/4 trials) during nectar thick liquids and oral dysphagia/impairments with anterior spillage. Given Devin Heath's hx of COPD, deconditioned state, clinical observations, and questionable infiltrate on CXR, SLP recommends instrumental testing (FEES) to objectively assess pt's swallow functioning/abilities and to determine the safest and most appropriate diet. Continue with current diet of Dys 1 (puree) and nectar thick liquids, meds crushed in puree.   HPI HPI: 83 y.o.malewith medical history significant of A. fib, osteoarthritis, CAD, diastolic dysfunction, COPD, type 2 diabetes, gout, hyperlipidemia, IBS, obstructive sleep apnea on CPAP, renal insufficiency, rheumatoid arthritis, restless leg syndrome who was recently discharged from the hospital due to GI bleed secondary to anticoagulation for atrial fibrillation, now brought from nursing home facility for evaluation of altered mental status and fever for the past 2 days. Dx HCAP, acute on chronic renal insufficiency. CXR 12/10/16 impression: Mild hazy atelectasis or infiltrate at the right infrahilar lung. Stable borderline to mild cardiomegaly without overt failure. Head CT 12/10/16 showed no acute findings.      SLP Plan  Continue with current plan of care     Recommendations  Diet recommendations: Nectar-thick liquid;Dysphagia 1 (puree) Liquids provided via:  Straw;Cup Medication Administration: Crushed with puree Supervision: Full supervision/cueing for compensatory strategies Compensations: Slow rate;Small sips/bites Postural Changes and/or Swallow Maneuvers: Seated upright 90 degrees                Oral Care Recommendations: Oral care BID Follow up Recommendations: Skilled Nursing facility Plan: Continue with current plan of care       GO                Devin Heath, Student-SLP 12/15/2016, 2:56 PM

## 2016-12-15 NOTE — Progress Notes (Signed)
Patient placed on CPAP tolerating well at this time.

## 2016-12-15 NOTE — Progress Notes (Signed)
Patient ID: Devin Heath, male   DOB: August 10, 1929, 83 y.o.   MRN: [id number]    PROGRESS NOTE  Jianni Batten  GDJ:[id number] DOB: 1929-09-30 DOA: 12/10/2016  PCP: [name], MD   Brief Narrative:  83 y.o. male with medical history significant of A. fib, osteoarthritis, CAD, diastolic dysfunction, COPD, type 2 diabetes, gout, hyperlipidemia, IBS, obstructive sleep apnea on CPAP, renal insufficiency, rheumatoid arthritis, restless leg syndrome who was recently discharged from the hospital due to GI bleed secondary to anticoagulation for atrial fibrillation, now brought from nursing home facility for evaluation of altered mental status and fever for the past 2 days. The patient was receiving ceftriaxone through [facility] line at the [facility].  ED Course: The patient received 2 L of normal saline bolus, vancomycin and Zosyn in the ED. Work up showed WBC of 10.4, hemoglobin level of 7.8 g/dL, platelet of 622. BMP showed BUN 28, creatinine 1.95 and glucose 149 mg/dL. His second lactic acid level was 2.46 mmol/L. urine analysis did not show signs of UTI, chest radiograph showed right lower quadrant infiltrate.  Assessment & Plan: Sepsis due to HCAP (healthcare-associated pneumonia) - pt with tachycardia and tachypnea with elevated lactic acid  - right infrahilar area, unknown pathogen  - continue zosyn day #6 as pt still with fevers 99.7 F, stopped vanc 1/21, fever curve trending down  - repeat CT chest also notable for bilateral pleural effusions  - lactic acid now cleared but pt still rather sick and weak, no significant clinical improvement   Acute hypoxic respiratory failure secondary to HCAP and acute on chronic diastolic CHF - ABX as noted above - also on Lasix 80 IV BID per cardiology, still volume overloaded, please see weight trend below  - weight trend since admission 195 --> 196 --> 188 lbs this AM  - ECHO requested for clearer evaluation and with stable EF but difficult to assess  diastolic function - cardiology has been consulted and assistance appreciated   Hypernatremia - still trending up from 148 --> 151 - dys I diet recommended after SLP done  - BMP in AM  Atrial fibrillation with rapid ventricular response (HCC) - CHA2DS2-VASc Score of at least 4 - started Cardizem drip, also added Metoprolol as needed for now - HR still in low 100's  - not AC due to recent significant GI bleed   Acute on chronic renal injury stage III - IVF provided but have to be stopped due to pleural effusions  - Cr is at baseline for now but will need close monitoring while in IV Lasix  - review of records indicated most recent Cr in 1.8 - 2.1 range  - repeat BMP in AM  Hyperlipidemia - Hold statin for now  CAD (coronary artery disease) - Not on antiplatelet agent or anticoagulation due to recent GI bleed.  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  Anemia of chronic disease and recent GI bleed - transfuse for Hg < 7.5 - Hg down in the past 24 hours  - CBC In AM  Obesity  - Body mass index is 30.77 kg/m.  DVT prophylaxis: SCD's Code Status: DNR Family Communication: Patient and wife at bedside  Disposition Plan: to be determined, needs to stay in SDU  Consultants:   Cardiology   PCT  Procedures:  None  Antimicrobials:   Vancomycin 1/18 --> 1/21  Zosyn 1/19 -->  Subjective: No events overnight, on BiPAP this AM.   Objective: Vitals:   12/15/16 1026 12/15/16 1300 12/15/16 1304 12/15/16 1634  BP:  (!) 127/109 113/64 (!) 135/56  Pulse:  (!) 111 99 99  Resp:  (!) 23 (!) 23 (!) 21  Temp:  98.4 F (36.9 C) 98.4 F (36.9 C) 98.3 F (36.8 C)  TempSrc:  Oral Oral Oral  SpO2:  100% 100% 98%  Weight: 85.5 kg (188 lb 7.9 oz)     Height:          Intake/Output Summary (Last 24 hours) at 12/15/16 1742 Last data filed at 12/15/16 0645  Gross per 24 hour  Intake            147.5 ml  Output                0 ml  Net            147.5 ml   Filed Weights   12/10/16 1916 12/11/16 0137 12/15/16 1026  Weight: 88.5 kg (195 lb) 89.1 kg (196 lb 6.9 oz) 85.5 kg (188 lb 7.9 oz)    Examination:  General exam: Appears somnolent but on BiPAP Respiratory system: rhonchi at bases with diminished air movement bilaterally Cardiovascular system: IRRR. No rubs, gallops or clicks. Upper extremity edema and +1 bilateral lower extremity pitting edema  Gastrointestinal system: Abdomen is nondistended, soft and nontender. No organomegaly or masses felt.  Central nervous system: somnolent but moving all 4 extremities spontaneously   Data Reviewed: I have personally reviewed following labs and imaging studies  CBC:  Recent Labs Lab 12/10/16 2026  12/11/16 0822 12/12/16 0438 12/13/16 0600 12/14/16 0600 12/15/16 0430  WBC 10.4  --  9.7 8.5 6.7 7.7 8.0  NEUTROABS 8.8*  --  8.4* 7.6 5.6  --   --   HGB 7.8*  < > 7.6* 7.6* 7.7* 8.3* 7.5*  HCT 26.1*  --  25.3* 25.2* 25.4* 27.9* 25.3*  MCV 87.6  --  88.5 87.5 87.3 87.2 87.5  PLT 152  --  154 173 201 250 246  < > = values in this interval not displayed. Basic Metabolic Panel:  Recent Labs Lab 12/10/16 2329 12/11/16 0822 12/12/16 0438 12/13/16 0600 12/14/16 0600 12/15/16 0430  NA  --  145 146* 148* 148* 151*  K  --  4.4 3.9 3.4* 3.2* 3.0*  CL  --  112* 115* 113* 112* 111  CO2  --  23 23 23 26 29   GLUCOSE  --  208* 185* 177* 181* 203*  BUN  --  28* 25* 25* 19 18  CREATININE  --  2.14* 1.70* 1.79* 1.86* 1.89*  CALCIUM  --  8.1* 8.2* 8.2* 8.3* 8.2*  MG 1.6*  --  2.6* 2.3  --   --   PHOS 3.5  --   --   --   --   --    Liver Function Tests:  Recent Labs Lab 12/10/16 2026 12/11/16 0822  AST 17 19  ALT 13* 12*  ALKPHOS 58 66  BILITOT 0.3 0.7  PROT 5.5* 5.3*  ALBUMIN 2.2* 2.2*     Recent Labs Lab 12/10/16 2306 12/12/16 1420  AMMONIA 30 30   Coagulation Profile:  Recent Labs Lab 12/11/16 0203  INR 1.18   CBG:  Recent Labs Lab 12/14/16 1628 12/14/16 2002 12/15/16 0758 12/15/16 1256 12/15/16 1636  GLUCAP 148*  158* 166* 187* 173*   Urine analysis:    Component Value Date/Time   COLORURINE YELLOW 12/10/2016 2137   APPEARANCEUR HAZY (A) 12/10/2016 2137   LABSPEC 1.012 12/10/2016 2137   PHURINE 5.0 12/10/2016 2137   GLUCOSEU NEGATIVE 12/10/2016 2137   HGBUR NEGATIVE 12/10/2016 2137   BILIRUBINUR NEGATIVE 12/10/2016 2137   KETONESUR NEGATIVE 12/10/2016 2137   PROTEINUR NEGATIVE 12/10/2016 2137   NITRITE NEGATIVE 12/10/2016 2137   LEUKOCYTESUR TRACE (A) 12/10/2016 2137   Recent Results (from the past 240 hour(s))  Respiratory Panel by PCR     Status: None   Collection Time: 12/10/16  7:58 PM  Result Value Ref Range Status   Adenovirus NOT DETECTED NOT DETECTED Final   Coronavirus 229E NOT DETECTED NOT DETECTED Final   Coronavirus HKU1 NOT DETECTED NOT DETECTED Final   Coronavirus NL63 NOT DETECTED NOT DETECTED Final   Coronavirus OC43 NOT DETECTED NOT DETECTED Final   Metapneumovirus NOT DETECTED NOT DETECTED Final   Rhinovirus / Enterovirus NOT DETECTED NOT DETECTED Final   Influenza A NOT DETECTED NOT DETECTED Final   Influenza B NOT DETECTED NOT DETECTED Final   Parainfluenza Virus 1 NOT DETECTED NOT DETECTED Final   Parainfluenza Virus 2 NOT DETECTED NOT DETECTED Final   Parainfluenza Virus 3 NOT DETECTED NOT DETECTED Final   Parainfluenza Virus 4 NOT DETECTED NOT DETECTED Final   Respiratory Syncytial Virus NOT DETECTED NOT DETECTED Final   Bordetella pertussis NOT DETECTED NOT DETECTED Final   Chlamydophila pneumoniae NOT DETECTED NOT DETECTED Final   Mycoplasma pneumoniae NOT DETECTED NOT DETECTED Final  Blood Culture (routine x 2)     Status: Abnormal   Collection Time: 12/10/16  8:18 PM  Result Value Ref Range Status    Specimen Description BLOOD LEFT HAND  Final   Special Requests IN PEDIATRIC BOTTLE  Final   Culture  Setup Time   Final    GRAM POSITIVE COCCI IN CLUSTERS IN PEDIATRIC BOTTLE CRITICAL RESULT CALLED TO, READ BACK BY AND VERIFIED WITH: A ANDERSON 12/12/16 @ 0807 M VESTAL    Culture (A)  Final    STAPHYLOCOCCUS SPECIES (COAGULASE NEGATIVE) THE SIGNIFICANCE OF ISOLATING THIS ORGANISM FROM A SINGLE SET OF BLOOD CULTURES WHEN MULTIPLE SETS ARE DRAWN IS UNCERTAIN. PLEASE NOTIFY THE MICROBIOLOGY DEPARTMENT WITHIN ONE WEEK IF SPECIATION AND SENSITIVITIES ARE REQUIRED.    Report Status 12/14/2016 FINAL  Final  Blood Culture ID Panel (Reflexed)     Status: Abnormal   Collection Time: 12/10/16  8:18 PM  Result Value Ref Range Status   Enterococcus species NOT DETECTED NOT DETECTED Final   Listeria monocytogenes NOT DETECTED NOT DETECTED Final   Staphylococcus species DETECTED (A) NOT DETECTED Final    Comment: CRITICAL RESULT CALLED TO, READ BACK BY AND VERIFIED WITH: A ANDERSON 12/12/16 @ 0807 M VESTAL    Staphylococcus aureus NOT DETECTED NOT DETECTED Final   Methicillin resistance NOT DETECTED NOT DETECTED Final   Streptococcus species NOT DETECTED NOT DETECTED Final   Streptococcus agalactiae NOT DETECTED NOT DETECTED Final   Streptococcus pneumoniae NOT DETECTED NOT DETECTED Final   Streptococcus pyogenes NOT DETECTED NOT DETECTED Final   Acinetobacter baumannii NOT DETECTED NOT DETECTED Final   Enterobacteriaceae species NOT DETECTED NOT DETECTED Final   Enterobacter cloacae complex NOT DETECTED NOT DETECTED Final   Escherichia coli NOT DETECTED NOT DETECTED Final   Klebsiella oxytoca NOT DETECTED NOT DETECTED Final   Klebsiella pneumoniae NOT DETECTED NOT DETECTED Final   Proteus  species NOT DETECTED NOT DETECTED Final   Serratia marcescens NOT DETECTED NOT DETECTED Final   Haemophilus influenzae NOT DETECTED NOT DETECTED Final   Neisseria meningitidis NOT DETECTED NOT DETECTED Final    Pseudomonas aeruginosa NOT DETECTED NOT DETECTED Final   Candida albicans NOT DETECTED NOT DETECTED Final   Candida glabrata NOT DETECTED NOT DETECTED Final   Candida krusei NOT DETECTED NOT DETECTED Final   Candida parapsilosis NOT DETECTED NOT DETECTED Final   Candida tropicalis NOT DETECTED NOT DETECTED Final  Urine culture     Status: None   Collection Time: 12/10/16  9:37 PM  Result Value Ref Range Status   Specimen Description URINE, CATHETERIZED  Final   Special Requests NONE  Final   Culture NO GROWTH  Final   Report Status 12/12/2016 FINAL  Final  Blood Culture (routine x 2)     Status: None (Preliminary result)   Collection Time: 12/10/16 11:08 PM  Result Value Ref Range Status   Specimen Description BLOOD LEFT HAND  Final   Special Requests IN PEDIATRIC BOTTLE  Final   Culture NO GROWTH 4 DAYS  Final   Report Status PENDING  Incomplete    Radiology Studies: No results found.  Scheduled Meds: . chlorhexidine  15 mL Mouth Rinse BID  . ferrous sulfate  325 mg Oral BID WC  . furosemide  80 mg Intravenous BID  . insulin aspart  0-15 Units Subcutaneous TID WC  . mouth rinse  15 mL Mouth Rinse q12n4p  . pantoprazole (PROTONIX) IV  40 mg Intravenous Q12H  . piperacillin-tazobactam (ZOSYN)  IV  3.375 g Intravenous Q8H  . pravastatin  20 mg Oral Daily   Continuous Infusions: . diltiazem (CARDIZEM) infusion 10 mg/hr (12/15/16 0645)     LOS: 5 days   Time spent: 20 minutes   Debbora Presto, MD [facility] Pager [phone]  If 7PM-7AM, please contact night-coverage [email or web address] Password [facility] 12/15/2016, 5:42 PM

## 2016-12-15 NOTE — Progress Notes (Addendum)
Patient ID: Devin Heath, male   DOB: 27-Aug-1929, 83 y.o.   MRN: [id number]    SUBJECTIVE: Lasix increased yesterday.  I/Os not recorded but bed weight is unchanged.  Seems more clear this morning.  Afebrile.  Main complaint is of knee pain.   Echo (1/18): EF 60-65%, mild aortic stenosis, mild to moderate MR, PASP 70 mmHg.   Scheduled Meds: . chlorhexidine  15 mL Mouth Rinse BID  . ferrous sulfate  325 mg Oral BID WC  . furosemide  80 mg Intravenous BID  . insulin aspart  0-15 Units Subcutaneous TID WC  . mouth rinse  15 mL Mouth Rinse q12n4p  . metolazone  2.5 mg Oral Once  . pantoprazole (PROTONIX) IV  40 mg Intravenous Q12H  . piperacillin-tazobactam (ZOSYN)  IV  3.375 g Intravenous Q8H   Continuous Infusions: . diltiazem (CARDIZEM) infusion 10 mg/hr (12/15/16 0645)   PRN Meds:.acetaminophen, acetaminophen, metoprolol, RESOURCE THICKENUP CLEAR, sodium chloride flush    Vitals:   12/14/16 1959 12/14/16 2320 12/14/16 2358 12/15/16 0401  BP:  126/76 126/76   Pulse:  (!) 105 (!) 118   Resp:  (!) 21 (!) 26   Temp: 98.4 F (36.9 C) 99.7 F (37.6 C)  99.1 F (37.3 C)  TempSrc: Oral Axillary  Axillary  SpO2:  99% 98%   Weight:      Height:        Intake/Output Summary (Last 24 hours) at 12/15/16 0755 Last data filed at 12/15/16 0645  Gross per 24 hour  Intake           542.83 ml  Output              500 ml  Net            42.83 ml    LABS: Basic Metabolic Panel:  Recent Labs  16/96/78 0600 12/14/16 0600 12/15/16 0430  NA 148* 148* 151*  K 3.4* 3.2* 3.0*  CL 113* 112* 111  CO2 23 26 29   GLUCOSE 177* 181* 203*  BUN 25* 19 18  CREATININE 1.79* 1.86* 1.89*  CALCIUM 8.2* 8.3* 8.2*  MG 2.3  --   --    Liver Function Tests: No results for input(s): AST, ALT, ALKPHOS, BILITOT, PROT, ALBUMIN in the last 72 hours. No results for input(s): LIPASE, AMYLASE in the last 72 hours. CBC:  Recent Labs  12/13/16 0600 12/14/16 0600 12/15/16 0430  WBC 6.7 7.7 8.0    NEUTROABS 5.6  --   --   HGB 7.7* 8.3* 7.5*  HCT 25.4* 27.9* 25.3*  MCV 87.3 87.2 87.5  PLT 201 250 246   Cardiac Enzymes: No results for input(s): CKTOTAL, CKMB, CKMBINDEX, TROPONINI in the last 72 hours. BNP: Invalid input(s): POCBNP D-Dimer: No results for input(s): DDIMER in the last 72 hours. Hemoglobin A1C: No results for input(s): HGBA1C in the last 72 hours. Fasting Lipid Panel: No results for input(s): CHOL, HDL, LDLCALC, TRIG, CHOLHDL, LDLDIRECT in the last 72 hours. Thyroid Function Tests: No results for input(s): TSH, T4TOTAL, T3FREE, THYROIDAB in the last 72 hours.  Invalid input(s): FREET3 Anemia Panel: No results for input(s): VITAMINB12, FOLATE, FERRITIN, TIBC, IRON, RETICCTPCT in the last 72 hours.  RADIOLOGY: Dg Cervical Spine 2 Or 3 Views  Result Date: 11/26/2016 CLINICAL DATA:  neck pain EXAM: CERVICAL SPINE - 2-3 VIEW COMPARISON:  None. FINDINGS: Normal alignment of the cervical spine. Ventral syndesmophytes are noted from C2 through C6. The disc spaces are relatively well preserved.  No fracture identified. IMPRESSION: 1. Multi level ventral syndesmophytes noted which may reflect diffuse idiopathic skeletal hyperostosis (DISH) Electronically Signed   By: Signa Kell M.D.   On: 11/26/2016 15:06   Dg Thoracic Spine 2 View  Result Date: 11/26/2016 CLINICAL DATA:  Back pain for 6 weeks. EXAM: THORACIC SPINE 2 VIEWS COMPARISON:  None. FINDINGS: Exaggerated thoracic kyphosis but normal alignment of the vertebral bodies. There are findings suggestive of DISH. No acute fracture or bone lesion. No abnormal paraspinal soft tissue swelling. Tortuosity and calcification of the thoracic aorta is noted. The visualized posterior ribs are intact. IMPRESSION: Moderate to advanced degenerative changes involving the spine with probable changes of DISH. Exaggerated thoracic kyphosis but no acute fracture. Electronically Signed   By: Rudie Meyer M.D.   On: 11/26/2016 15:13   Ct  Head Wo Contrast  Result Date: 12/10/2016 CLINICAL DATA:  Fever and hypotension, slurred speech, suspect sepsis. Assess for CVA. EXAM: CT HEAD WITHOUT CONTRAST TECHNIQUE: Contiguous axial images were obtained from the base of the skull through the vertex without intravenous contrast. COMPARISON:  None. FINDINGS: Brain: There is generalized age related parenchymal atrophy with commensurate dilatation of the ventricles and sulci. Mild chronic small vessel ischemic changes noted within the deep periventricular white matter regions bilaterally. There is no mass, hemorrhage, edema or other evidence of acute parenchymal abnormality. No extra-axial hemorrhage. Vascular: There are chronic calcified atherosclerotic changes of the large vessels at the skull base. No unexpected hyperdense vessel. Skull: Normal. Negative for fracture or focal lesion. Sinuses/Orbits: Extensive chronic appearing mucosal thickening throughout the bilateral maxillary sinuses. Orbital/periorbital soft tissues are unremarkable. Other: None. IMPRESSION: 1. No acute findings.  No intracranial mass, hemorrhage or edema. 2. Chronic appearing paranasal sinus disease. Electronically Signed   By: Bary Richard M.D.   On: 12/10/2016 21:23   Ct Chest Wo Contrast  Result Date: 12/12/2016 CLINICAL DATA:  Sepsis.  Abnormal chest radiograph. EXAM: CT CHEST WITHOUT CONTRAST TECHNIQUE: Multidetector CT imaging of the chest was performed following the standard protocol without IV contrast. COMPARISON:  Chest radiograph from 12/10/2016 FINDINGS: Cardiovascular: The heart size is mildly enlarged. There is aortic atherosclerosis identified. Previous median sternotomy and CABG procedure. Mediastinum/Nodes: The trachea appears patent and is midline. Normal appearance of the esophagus. Calcified mediastinal and hilar lymph nodes are identified compatible with prior granulomatous disease. Noncalcified left paratracheal lymph node measures 1.2 cm, image 50 of series  201. Noncalcified right paratracheal lymph node measures 1.2 cm, image 52 of series 201. Lungs/Pleura: Diffuse bronchial wall thickening. There are moderate bilateral pleural effusions. There is mild overlying compressive type atelectasis. Interlobular septal thickening within the lung bases compatible with mild edema. A few clustered non-specific pulmonary nodules noted in the left lower lobe noted measure up to 7 mm, image 67 series 205. Upper Abdomen: No acute abnormality. Musculoskeletal: No chest wall mass or suspicious bone lesions identified. IMPRESSION: 1. No evidence for pneumonia. 2. Mild cardiac enlargement, aortic atherosclerosis, mild edema and bilateral pleural effusions. Suspected congestive heart failure. 3. Prior granulomatous disease. 4. Small nonspecific nodules clustered in the left lower lobe. Non-contrast chest CT at 3-6 months is recommended. If the nodules are stable at time of repeat CT, then future CT at 18-24 months (from today's scan) is considered optional for low-risk patients, but is recommended for high-risk patients. This recommendation follows the consensus statement: Guidelines for Management of Incidental Pulmonary Nodules Detected on CT Images:From the Fleischner Society 2017; published online before print (10.1148/radiol.8127517001). Electronically Signed  By: Signa Kell M.D.   On: 12/12/2016 09:39   Dg Chest Portable 1 View  Result Date: 12/10/2016 CLINICAL DATA:  Fever EXAM: PORTABLE CHEST 1 VIEW COMPARISON:  11/25/2016 FINDINGS: Median sternotomy wires are again evident. A right upper extremity catheter tip is difficult to visualize, catheter tubing is seen to the level of SVC. Mild atelectasis or infiltrate at the right lung base. No effusion. Stable cardiomediastinal silhouette. No pneumothorax. IMPRESSION: Mild hazy atelectasis or infiltrate at the right infrahilar lung. Stable borderline to mild cardiomegaly without overt failure. Electronically Signed   By: Jasmine Pang M.D.   On: 12/10/2016 23:18   Dg Chest Port 1 View  Result Date: 11/25/2016 CLINICAL DATA:  Leukocytosis. EXAM: PORTABLE CHEST 1 VIEW COMPARISON:  11/24/2016 FINDINGS: Prior CABG. Heart is borderline in size. Diffuse interstitial prominence throughout the lungs with peribronchial thickening. Mild hyperinflation. No confluent opacity or effusion. No acute bony abnormality. IMPRESSION: Hyperinflation/ COPD.  Chronic bronchitic changes. Electronically Signed   By: Charlett Nose M.D.   On: 11/25/2016 09:41   Dg Chest Port 1 View  Result Date: 11/24/2016 CLINICAL DATA:  Initial evaluation for acute shortness of breath. EXAM: PORTABLE CHEST 1 VIEW COMPARISON:  Prior radiograph from 11/21/2016. FINDINGS: Median sternotomy wires with underlying CABG markers and surgical clips, stable. Mild cardiomegaly is unchanged. Mediastinal silhouette within normal limits. Aortic atherosclerosis noted. Lungs normally inflated. No focal infiltrates. No pulmonary edema or pleural effusion. No pneumothorax. No acute osseous abnormality. IMPRESSION: 1. No active cardiopulmonary disease. 2. Mild cardiomegaly with sequelae of prior CABG. Electronically Signed   By: Rise Mu M.D.   On: 11/24/2016 22:00   Dg Chest Portable 1 View  Result Date: 11/21/2016 CLINICAL DATA:  Pt with weight gain this AM of 2 lbs, pt has increased SOB. Hx CHF, COPD, CAD, afib EXAM: PORTABLE CHEST 1 VIEW COMPARISON:  10/13/2016 FINDINGS: Status post median sternotomy and CABG. Heart size is normal. Lungs are clear. No pulmonary edema. Degenerative changes are seen in thoracic spine. IMPRESSION: No evidence for acute  abnormality. Electronically Signed   By: Norva Pavlov M.D.   On: 11/21/2016 17:03    PHYSICAL EXAM General: NAD, wearing CPAP Neck: JVP 12+ cm, no thyromegaly or thyroid nodule.  Lungs: Clear to auscultation bilaterally with normal respiratory effort. CV: Nondisplaced PMI.  Heart mildly tachy, irregular S1/S2, no  S3/S4, 1/6 SEM RUSB.  1+ ankle edema.   Abdomen: Soft, nontender, no hepatosplenomegaly, no distention.  Neurologic: Alert, oriented to person/place.  Psych: Normal affect. Extremities: No clubbing or cyanosis.   TELEMETRY: Reviewed telemetry pt in atrial fibrillation rate 100s  ASSESSMENT AND PLAN: 83 yo with history of CAD s/p CABG, CKD stage IV, chronic atrial fibrillation, and chronic diastolic CHF presented with PNA along with acute/chronic diastolic CHF.  1. Atrial fibrillation: Mild RVR on diltiazem gtt currently.  He is not on anticoagulation due to GI bleeding in 12/17.  2. Acute on chronic diastolic CHF: He is significantly volume overloaded on exam.  Creatinine appears to be at baseline.  I do not think he diuresed much yesterday.  - Lasix 80 mg IV bid today with dose of metolazone 2.5 x 1.  - Need to follow I/Os, weight standing up if possible.  3. CKD: Stage IV.  Creatinine appears to be at baseline.  4. ID: Suspect PNA versus acute bronchitis.  Febrile at admission.  Blood cultures with only 1/2 coag negative Staph, suspect contaminant.  - Currently on Zosyn per primary  service.  5. CAD: s/p CABG.  Not on ASA with recent bleeding and current anemia.  Can continue home statin.  6. Anemia: Creatinine to 7.5 today, no overt bleeding.  Transfuse hgb < 7.  Had recent GI bleed in 12/17 and Eliquis stopped.  7. OSA: CPAP at night.  8. Hypernatremia: Na higher today.  Encourage po intake, he seems less confused this morning. Repeat BMET in pm.  9. Agree that palliative care evaluation is reasonable.  He is DNR.   Marca Ancona 12/15/2016 8:05 AM

## 2016-12-15 NOTE — Progress Notes (Signed)
PT Cancellation Note  Patient Details Name: Devin Heath MRN: [id number] DOB: March 06, 1929   Cancelled Treatment:    Reason Eval/Treat Not Completed: Pain limiting ability to participate   Fabio Asa 12/15/2016, 4:46 PM

## 2016-12-16 DIAGNOSIS — N189 Chronic kidney disease, unspecified: Secondary | ICD-10-CM

## 2016-12-16 DIAGNOSIS — N179 Acute kidney failure, unspecified: Secondary | ICD-10-CM

## 2016-12-16 DIAGNOSIS — N289 Disorder of kidney and ureter, unspecified: Secondary | ICD-10-CM

## 2016-12-16 DIAGNOSIS — I2510 Atherosclerotic heart disease of native coronary artery without angina pectoris: Secondary | ICD-10-CM

## 2016-12-16 LAB — BASIC METABOLIC PANEL
ANION GAP: 15 (ref 5–15)
BUN: 17 mg/dL (ref 6–20)
CHLORIDE: 104 mmol/L (ref 101–111)
CO2: 32 mmol/L (ref 22–32)
Calcium: 8.5 mg/dL — ABNORMAL LOW (ref 8.9–10.3)
Creatinine, Ser: 2.02 mg/dL — ABNORMAL HIGH (ref 0.61–1.24)
GFR calc Af Amer: 33 mL/min — ABNORMAL LOW (ref >60)
GFR calc non Af Amer: 28 mL/min — ABNORMAL LOW (ref >60)
GLUCOSE: 189 mg/dL — AB (ref 65–99)
POTASSIUM: 2.9 mmol/L — AB (ref 3.5–5.1)
Sodium: 151 mmol/L — ABNORMAL HIGH (ref 135–145)

## 2016-12-16 LAB — CULTURE, BLOOD (ROUTINE X 2): Culture: NO GROWTH

## 2016-12-16 LAB — GLUCOSE, CAPILLARY
GLUCOSE-CAPILLARY: 161 mg/dL — AB (ref 65–99)
GLUCOSE-CAPILLARY: 165 mg/dL — AB (ref 65–99)
GLUCOSE-CAPILLARY: 145 mg/dL — AB (ref 65–99)
GLUCOSE-CAPILLARY: 120 mg/dL — AB (ref 65–99)

## 2016-12-16 LAB — CBC
HEMATOCRIT: 28 % — AB (ref 39.0–52.0)
HEMOGLOBIN: 8.2 g/dL — AB (ref 13.0–17.0)
MCH: 25.7 pg — AB (ref 26.0–34.0)
MCHC: 29.3 g/dL — AB (ref 30.0–36.0)
MCV: 87.8 fL (ref 78.0–100.0)
Platelets: 271 10*3/uL (ref 150–400)
RBC: 3.19 MIL/uL — ABNORMAL LOW (ref 4.22–5.81)
RDW: 15.8 % — AB (ref 11.5–15.5)
WBC: 8.2 10*3/uL (ref 4.0–10.5)

## 2016-12-16 LAB — LACTIC ACID, PLASMA: Lactic Acid, Venous: 1.5 mmol/L (ref 0.5–1.9)

## 2016-12-16 MED ORDER — CARVEDILOL 6.25 MG PO TABS
6.2500 mg | ORAL_TABLET | Freq: Two times a day (BID) | ORAL | Status: DC
  Administered 2016-12-16 (×2): 6.25 mg via ORAL
  Filled 2016-12-16 (×4): qty 1

## 2016-12-16 MED ORDER — POTASSIUM CHLORIDE 20 MEQ PO PACK
40.0000 meq | PACK | Freq: Once | ORAL | Status: DC
  Filled 2016-12-16: qty 2

## 2016-12-16 MED ORDER — MORPHINE SULFATE (CONCENTRATE) 10 MG/0.5ML PO SOLN
2.0000 mg | ORAL | Status: DC | PRN
  Administered 2016-12-17 – 2016-12-18 (×3): 2 mg via SUBLINGUAL
  Filled 2016-12-16 (×3): qty 0.5

## 2016-12-16 MED ORDER — SODIUM CHLORIDE 0.9 % IV SOLN
30.0000 meq | Freq: Once | INTRAVENOUS | Status: AC
  Administered 2016-12-16: 30 meq via INTRAVENOUS
  Filled 2016-12-16: qty 15

## 2016-12-16 MED ORDER — TORSEMIDE 20 MG PO TABS
40.0000 mg | ORAL_TABLET | Freq: Every day | ORAL | Status: DC
  Administered 2016-12-16: 40 mg via ORAL
  Filled 2016-12-16 (×3): qty 2

## 2016-12-16 MED ORDER — POTASSIUM CHLORIDE 20 MEQ PO PACK
40.0000 meq | PACK | Freq: Once | ORAL | Status: AC
  Administered 2016-12-16: 40 meq via ORAL
  Filled 2016-12-16: qty 2

## 2016-12-16 MED ORDER — HYDROCODONE-ACETAMINOPHEN 5-325 MG PO TABS
1.0000 | ORAL_TABLET | Freq: Four times a day (QID) | ORAL | Status: DC | PRN
  Administered 2016-12-16: 2 via ORAL
  Administered 2016-12-18: 1 via ORAL
  Filled 2016-12-16 (×2): qty 2
  Filled 2016-12-16: qty 1

## 2016-12-16 NOTE — Progress Notes (Signed)
Speech Language Pathology     Patient Details Name: Devin Heath MRN: [id number] DOB: 1929-03-12 Today's Date: 12/16/2016 Time:  -        FEES scheduled for this afternoon around 1:15.              Royce Macadamia 12/16/2016, 10:37 AM  Breck Coons Lonell Face.Ed [employer] [phone]

## 2016-12-16 NOTE — Progress Notes (Signed)
Pt placed on cpap; tolerating well at this time. 

## 2016-12-16 NOTE — Progress Notes (Signed)
Patient ID: Devin Heath, male   DOB: 1929/06/16, 83 y.o.   MRN: [id number]    SUBJECTIVE: I/Os not recorded due to incontinence but weight is down.  Afebrile, denies dyspnea.  Creatinine up to 2 today. CVP 9-10 by my measure.   Echo (1/18): EF 60-65%, mild aortic stenosis, mild to moderate MR, PASP 70 mmHg.   Scheduled Meds: . carvedilol  6.25 mg Oral BID WC  . chlorhexidine  15 mL Mouth Rinse BID  . ferrous sulfate  325 mg Oral BID WC  . insulin aspart  0-15 Units Subcutaneous TID WC  . mouth rinse  15 mL Mouth Rinse q12n4p  . pantoprazole (PROTONIX) IV  40 mg Intravenous Q12H  . piperacillin-tazobactam (ZOSYN)  IV  3.375 g Intravenous Q8H  . potassium chloride  40 mEq Oral Once  . potassium chloride  40 mEq Oral Once  . pravastatin  20 mg Oral Daily  . torsemide  40 mg Oral Daily   Continuous Infusions: . diltiazem (CARDIZEM) infusion 10 mg/hr (12/16/16 0449)   PRN Meds:.acetaminophen, acetaminophen, RESOURCE THICKENUP CLEAR, sodium chloride flush    Vitals:   12/15/16 2238 12/16/16 0353 12/16/16 0446 12/16/16 0803  BP: 113/71 (!) 137/58  131/73  Pulse: (!) 103 (!) 116  (!) 107  Resp: (!) 22 20  19   Temp:  99.5 F (37.5 C)  99.4 F (37.4 C)  TempSrc:  Axillary  Axillary  SpO2: 98% 99%  96%  Weight:   186 lb 4.6 oz (84.5 kg)   Height:       No intake or output data in the 24 hours ending 12/16/16 0817  LABS: Basic Metabolic Panel:  Recent Labs  12/18/16 1720 12/16/16 0432  NA 152* 151*  K 2.9* 2.9*  CL 109 104  CO2 32 32  GLUCOSE 176* 189*  BUN 18 17  CREATININE 1.89* 2.02*  CALCIUM 8.6* 8.5*   Liver Function Tests:  Recent Labs  12/15/16 1720  ALBUMIN 2.2*   No results for input(s): LIPASE, AMYLASE in the last 72 hours. CBC:  Recent Labs  12/15/16 0430 12/16/16 0432  WBC 8.0 8.2  HGB 7.5* 8.2*  HCT 25.3* 28.0*  MCV 87.5 87.8  PLT 246 271   Cardiac Enzymes: No results for input(s): CKTOTAL, CKMB, CKMBINDEX, TROPONINI in the last 72  hours. BNP: Invalid input(s): POCBNP D-Dimer: No results for input(s): DDIMER in the last 72 hours. Hemoglobin A1C: No results for input(s): HGBA1C in the last 72 hours. Fasting Lipid Panel: No results for input(s): CHOL, HDL, LDLCALC, TRIG, CHOLHDL, LDLDIRECT in the last 72 hours. Thyroid Function Tests: No results for input(s): TSH, T4TOTAL, T3FREE, THYROIDAB in the last 72 hours.  Invalid input(s): FREET3 Anemia Panel: No results for input(s): VITAMINB12, FOLATE, FERRITIN, TIBC, IRON, RETICCTPCT in the last 72 hours.  RADIOLOGY: Dg Cervical Spine 2 Or 3 Views  Result Date: 11/26/2016 CLINICAL DATA:  neck pain EXAM: CERVICAL SPINE - 2-3 VIEW COMPARISON:  None. FINDINGS: Normal alignment of the cervical spine. Ventral syndesmophytes are noted from C2 through C6. The disc spaces are relatively well preserved. No fracture identified. IMPRESSION: 1. Multi level ventral syndesmophytes noted which may reflect diffuse idiopathic skeletal hyperostosis (DISH) Electronically Signed   By: 01/24/2017 M.D.   On: 11/26/2016 15:06   Dg Thoracic Spine 2 View  Result Date: 11/26/2016 CLINICAL DATA:  Back pain for 6 weeks. EXAM: THORACIC SPINE 2 VIEWS COMPARISON:  None. FINDINGS: Exaggerated thoracic kyphosis but normal alignment of the vertebral  bodies. There are findings suggestive of DISH. No acute fracture or bone lesion. No abnormal paraspinal soft tissue swelling. Tortuosity and calcification of the thoracic aorta is noted. The visualized posterior ribs are intact. IMPRESSION: Moderate to advanced degenerative changes involving the spine with probable changes of DISH. Exaggerated thoracic kyphosis but no acute fracture. Electronically Signed   By: Rudie Meyer M.D.   On: 11/26/2016 15:13   Ct Head Wo Contrast  Result Date: 12/10/2016 CLINICAL DATA:  Fever and hypotension, slurred speech, suspect sepsis. Assess for CVA. EXAM: CT HEAD WITHOUT CONTRAST TECHNIQUE: Contiguous axial images were  obtained from the base of the skull through the vertex without intravenous contrast. COMPARISON:  None. FINDINGS: Brain: There is generalized age related parenchymal atrophy with commensurate dilatation of the ventricles and sulci. Mild chronic small vessel ischemic changes noted within the deep periventricular white matter regions bilaterally. There is no mass, hemorrhage, edema or other evidence of acute parenchymal abnormality. No extra-axial hemorrhage. Vascular: There are chronic calcified atherosclerotic changes of the large vessels at the skull base. No unexpected hyperdense vessel. Skull: Normal. Negative for fracture or focal lesion. Sinuses/Orbits: Extensive chronic appearing mucosal thickening throughout the bilateral maxillary sinuses. Orbital/periorbital soft tissues are unremarkable. Other: None. IMPRESSION: 1. No acute findings.  No intracranial mass, hemorrhage or edema. 2. Chronic appearing paranasal sinus disease. Electronically Signed   By: Bary Richard M.D.   On: 12/10/2016 21:23   Ct Chest Wo Contrast  Result Date: 12/12/2016 CLINICAL DATA:  Sepsis.  Abnormal chest radiograph. EXAM: CT CHEST WITHOUT CONTRAST TECHNIQUE: Multidetector CT imaging of the chest was performed following the standard protocol without IV contrast. COMPARISON:  Chest radiograph from 12/10/2016 FINDINGS: Cardiovascular: The heart size is mildly enlarged. There is aortic atherosclerosis identified. Previous median sternotomy and CABG procedure. Mediastinum/Nodes: The trachea appears patent and is midline. Normal appearance of the esophagus. Calcified mediastinal and hilar lymph nodes are identified compatible with prior granulomatous disease. Noncalcified left paratracheal lymph node measures 1.2 cm, image 50 of series 201. Noncalcified right paratracheal lymph node measures 1.2 cm, image 52 of series 201. Lungs/Pleura: Diffuse bronchial wall thickening. There are moderate bilateral pleural effusions. There is mild  overlying compressive type atelectasis. Interlobular septal thickening within the lung bases compatible with mild edema. A few clustered non-specific pulmonary nodules noted in the left lower lobe noted measure up to 7 mm, image 67 series 205. Upper Abdomen: No acute abnormality. Musculoskeletal: No chest wall mass or suspicious bone lesions identified. IMPRESSION: 1. No evidence for pneumonia. 2. Mild cardiac enlargement, aortic atherosclerosis, mild edema and bilateral pleural effusions. Suspected congestive heart failure. 3. Prior granulomatous disease. 4. Small nonspecific nodules clustered in the left lower lobe. Non-contrast chest CT at 3-6 months is recommended. If the nodules are stable at time of repeat CT, then future CT at 18-24 months (from today's scan) is considered optional for low-risk patients, but is recommended for high-risk patients. This recommendation follows the consensus statement: Guidelines for Management of Incidental Pulmonary Nodules Detected on CT Images:From the Fleischner Society 2017; published online before print (10.1148/[id number]). Electronically Signed   By: Signa Kell M.D.   On: 12/12/2016 09:39   Dg Chest Portable 1 View  Result Date: 12/10/2016 CLINICAL DATA:  Fever EXAM: PORTABLE CHEST 1 VIEW COMPARISON:  11/25/2016 FINDINGS: Median sternotomy wires are again evident. A right upper extremity catheter tip is difficult to visualize, catheter tubing is seen to the level of SVC. Mild atelectasis or infiltrate at the right lung base.  No effusion. Stable cardiomediastinal silhouette. No pneumothorax. IMPRESSION: Mild hazy atelectasis or infiltrate at the right infrahilar lung. Stable borderline to mild cardiomegaly without overt failure. Electronically Signed   By: Jasmine Pang M.D.   On: 12/10/2016 23:18   Dg Chest Port 1 View  Result Date: 11/25/2016 CLINICAL DATA:  Leukocytosis. EXAM: PORTABLE CHEST 1 VIEW COMPARISON:  11/24/2016 FINDINGS: Prior CABG. Heart  is borderline in size. Diffuse interstitial prominence throughout the lungs with peribronchial thickening. Mild hyperinflation. No confluent opacity or effusion. No acute bony abnormality. IMPRESSION: Hyperinflation/ COPD.  Chronic bronchitic changes. Electronically Signed   By: Charlett Nose M.D.   On: 11/25/2016 09:41   Dg Chest Port 1 View  Result Date: 11/24/2016 CLINICAL DATA:  Initial evaluation for acute shortness of breath. EXAM: PORTABLE CHEST 1 VIEW COMPARISON:  Prior radiograph from 11/21/2016. FINDINGS: Median sternotomy wires with underlying CABG markers and surgical clips, stable. Mild cardiomegaly is unchanged. Mediastinal silhouette within normal limits. Aortic atherosclerosis noted. Lungs normally inflated. No focal infiltrates. No pulmonary edema or pleural effusion. No pneumothorax. No acute osseous abnormality. IMPRESSION: 1. No active cardiopulmonary disease. 2. Mild cardiomegaly with sequelae of prior CABG. Electronically Signed   By: Rise Mu M.D.   On: 11/24/2016 22:00   Dg Chest Portable 1 View  Result Date: 11/21/2016 CLINICAL DATA:  Pt with weight gain this AM of 2 lbs, pt has increased SOB. Hx CHF, COPD, CAD, afib EXAM: PORTABLE CHEST 1 VIEW COMPARISON:  10/13/2016 FINDINGS: Status post median sternotomy and CABG. Heart size is normal. Lungs are clear. No pulmonary edema. Degenerative changes are seen in thoracic spine. IMPRESSION: No evidence for acute  abnormality. Electronically Signed   By: Norva Pavlov M.D.   On: 11/21/2016 17:03    PHYSICAL EXAM General: NAD, wearing CPAP Neck: JVP 8-9 cm, no thyromegaly or thyroid nodule.  Lungs: Clear to auscultation bilaterally with normal respiratory effort. CV: Nondisplaced PMI.  Heart mildly tachy, irregular S1/S2, no S3/S4, 1/6 SEM RUSB.  1+ ankle edema.   Abdomen: Soft, nontender, no hepatosplenomegaly, no distention.  Neurologic: Alert, oriented to person/place.  Psych: Normal affect. Extremities: No  clubbing or cyanosis.   TELEMETRY: Reviewed telemetry pt in atrial fibrillation rate 100s-110s  ASSESSMENT AND PLAN: 83 yo with history of CAD s/p CABG, CKD stage IV, chronic atrial fibrillation, and chronic diastolic CHF presented with PNA along with acute/chronic diastolic CHF.  1. Atrial fibrillation: Mild RVR on diltiazem gtt currently.  He is not on anticoagulation due to GI bleeding in 12/17.  - Continue diltiazem gtt, eventually transition to po diltiazem.  - Start Coreg 6.25 mg bid to help with rate control.  2. Acute on chronic diastolic CHF: Weight down with diuresis yesterday, incontinent so no I/Os.  CVP 9-10 today, lower.    - As creatinine is up to 2, will transition to torsemide 40 mg daily (was on 20 mg daily at home). - Replace K.   3. CKD: Stage IV.  Creatinine mildly higher with diuresis.  4. ID: Suspect PNA versus acute bronchitis.  Febrile at admission.  Blood cultures with only 1/2 coag negative Staph, suspect contaminant.  - Currently on Zosyn per primary service.  5. CAD: s/p CABG.  Not on ASA with recent bleeding and current anemia.  Can continue home statin.  6. Anemia: Hemoglobin higher today, no overt bleeding.  Transfuse hgb < 7.  Had recent GI bleed in 12/17 and Eliquis stopped.  7. OSA: CPAP at night.  8. Hypernatremia:  Na still elevated.  Encourage po intake.  9. Awaiting palliative care evaluation.  He is DNR.   Marca Ancona 12/16/2016 8:17 AM

## 2016-12-16 NOTE — Progress Notes (Signed)
[facility]  PROGRESS NOTE  Devin Heath XNT:[id number] DOB: 07/09/1929 DOA: 12/10/2016 PCP: [name], MD   Brief HPI:   83 y.o.malewith medical history significant of A. fib, osteoarthritis, CAD, diastolic dysfunction, COPD, type 2 diabetes, gout, hyperlipidemia, IBS, obstructive sleep apnea on CPAP, renal insufficiency, rheumatoid arthritis, restless leg syndrome who was recently discharged from the hospital due to GI bleed secondary to anticoagulation for atrial fibrillation, now brought from nursing home facility for evaluation of altered mental status and fever for the past 2 days. The patient was receiving ceftriaxone through [facility] line at the [facility]    Subjective   Patient complains of generalized body aches.   Assessment/Plan:     1. Sepsis- due to healthcare associated pneumonia, continue Zosyn, vancomycin stop on 12/13/2016. Repeat CT chest showed bilateral pleural effusions. Lactic acid is now clear. Continue Zosyn. Blood cultures 1 out of 2 bottles growing coagulase-negative staph. 2. Acute hypoxic respiratory failure- multifactorial from pneumonia as well as acute diastolic CHF. Patient was started on on Lasix 80 mg IV twice a day per cardiology. Echo Cardizem showed stable EF and difficult to assess vascular dysfunction. Lasix has now a transitioned  to torsemide 40 mg daily. Will follow BMP in a.m. 3. CAD status post CABG- patient on aspirin, continue statins. 4. Hypernatremia- still has elevated sodium 151. Follow BMP in a.m. 5. Hypokalemia-potassium was 2.9, will replace potassium and check BMP in a.m. 6. Atrial fibrillation- CHA2DS2VASc score is 4. Started on Cardizem drip, metoprolol. Not anti-coagulated due to significant GI bleed. 7. Acute on chronic kidney stage III-today creatinine 2.02 due to high-dose diuresis. Lasix now has been changed to by mouth torsemide. Will follow BMP in a.m. 8. Obstructive sleep apnea-continue CPAP 9. 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 Blood glucose is stable. 10. Anemia of chronic disease- patient had recent GI bleed, hemoglobin is 8.2 today. Will follow CBC in a.m.    DVT prophylaxis: SCDs  Code Status: DO NOT RESUSCITATE  Family Communication: No family present at bedside   Disposition Plan: Pending improvement in respiratory status   Consultants:  Cardiology  Procedures:  None  Continuous infusions . diltiazem (CARDIZEM) infusion 10 mg/hr (12/16/16 0449)      Antibiotics:   Anti-infectives    Start     Dose/Rate Route Frequency Ordered Stop   12/11/16 1600  vancomycin (VANCOCIN) IVPB 1000 mg/200 mL premix  Status:  Discontinued     1,000 mg 200 mL/hr over 60 Minutes Intravenous Every 24 hours 12/11/16 1445 12/13/16 1738   12/11/16 1500  vancomycin (VANCOCIN) 1,250 mg in sodium chloride 0.9 % 250 mL IVPB  Status:  Discontinued     1,250 mg 166.7 mL/hr over 90 Minutes Intravenous Every 24 hours 12/10/16 1953 12/11/16 1445   12/11/16 0200  piperacillin-tazobactam (ZOSYN) IVPB 3.375 g     3.375 g 12.5 mL/hr over 240 Minutes Intravenous Every 8 hours 12/10/16 1953     12/10/16 1945  piperacillin-tazobactam (ZOSYN) IVPB 3.375 g     3.375 g 100 mL/hr over 30 Minutes Intravenous  Once 12/10/16 1942 12/10/16 2100   12/10/16 1945  vancomycin (VANCOCIN) IVPB 1000 mg/200 mL premix     1,000 mg 200 mL/hr over 60 Minutes Intravenous  Once 12/10/16 1942 12/10/16 2141       Objective   Vitals:   12/16/16 0353 12/16/16 0446 12/16/16 0803 12/16/16 1225  BP: (!) 137/58  131/73 (!) 131/58  Pulse: (!) 116  Marland Kitchen)  107 99  Resp: 20  19 20   Temp: 99.5 F (37.5 C)  99.4 F (37.4 C) 98 F (36.7 C)  TempSrc: Axillary  Axillary Oral  SpO2: 99%  96% 97%  Weight:  84.5 kg (186 lb 4.6 oz)    Height:        Intake/Output Summary (Last 24 hours) at 12/16/16 1514 Last data filed at 12/16/16 1200  Gross per 24 hour  Intake             342.5 ml  Output                0 ml  Net            342.5 ml   Filed Weights   12/11/16 0137 12/15/16 1026 12/16/16 0446  Weight: 89.1 kg (196 lb 6.9 oz) 85.5 kg (188 lb 7.9 oz) 84.5 kg (186 lb 4.6 oz)     Physical Examination:  General exam: Appears calm and comfortable. Respiratory system: Clear to auscultation. Respiratory effort normal. Cardiovascular system:  RRR. No  murmurs, rubs, gallops. Trace edema lower extremities bilaterally GI system: Abdomen is nondistended, soft and nontender. No organomegaly.  Central nervous system. No focal neurological deficits. 5 x 5 power in all extremities. Skin: No rashes, lesions or ulcers. Psychiatry: Alert, oriented x 3.Judgement and insight appear normal. Affect normal.    Data Reviewed: I have personally reviewed following labs and imaging studies  CBG:  Recent Labs Lab 12/15/16 1256 12/15/16 1636 12/15/16 2059 12/16/16 0745 12/16/16 1224  GLUCAP 187* 173* 147* 161* 165*    CBC:  Recent Labs Lab 12/10/16 2026  12/11/16 0822 12/12/16 0438 12/13/16 0600 12/14/16 0600 12/15/16 0430 12/16/16 0432  WBC 10.4  --  9.7 8.5 6.7 7.7 8.0 8.2  NEUTROABS 8.8*  --  8.4* 7.6 5.6  --   --   --   HGB 7.8*  < > 7.6* 7.6* 7.7* 8.3* 7.5* 8.2*  HCT 26.1*  --  25.3* 25.2* 25.4* 27.9* 25.3* 28.0*  MCV 87.6  --  88.5 87.5 87.3 87.2 87.5 87.8  PLT 152  --  154 173 201 250 246 271  < > = values in this interval not displayed.  Basic Metabolic Panel:  Recent Labs Lab 12/10/16 2329  12/12/16 0438 12/13/16 0600 12/14/16 0600 12/15/16 0430 12/15/16 1720 12/16/16 0432  NA  --   < > 146* 148* 148* 151* 152* 151*  K  --   < > 3.9 3.4* 3.2* 3.0* 2.9* 2.9*  CL  --   < > 115* 113* 112* 111 109 104  CO2  --   < > 23 23 26 29  32 32  GLUCOSE  --   < > 185* 177* 181* 203* 176* 189*  BUN  --   < > 25* 25* 19 18 18 17   CREATININE  --   < > 1.70* 1.79* 1.86* 1.89* 1.89* 2.02*  CALCIUM  --   < > 8.2* 8.2* 8.3* 8.2* 8.6* 8.5*  MG 1.6*   --  2.6* 2.3  --   --   --   --   PHOS 3.5  --   --   --   --   --   --   --   < > = values in this interval not displayed.  Recent Results (from the past 240 hour(s))  Respiratory Panel by PCR     Status: None   Collection Time: 12/10/16  7:58 PM  Result  Value Ref Range Status   Adenovirus NOT DETECTED NOT DETECTED Final   Coronavirus 229E NOT DETECTED NOT DETECTED Final   Coronavirus HKU1 NOT DETECTED NOT DETECTED Final   Coronavirus NL63 NOT DETECTED NOT DETECTED Final   Coronavirus OC43 NOT DETECTED NOT DETECTED Final   Metapneumovirus NOT DETECTED NOT DETECTED Final   Rhinovirus / Enterovirus NOT DETECTED NOT DETECTED Final   Influenza A NOT DETECTED NOT DETECTED Final   Influenza B NOT DETECTED NOT DETECTED Final   Parainfluenza Virus 1 NOT DETECTED NOT DETECTED Final   Parainfluenza Virus 2 NOT DETECTED NOT DETECTED Final   Parainfluenza Virus 3 NOT DETECTED NOT DETECTED Final   Parainfluenza Virus 4 NOT DETECTED NOT DETECTED Final   Respiratory Syncytial Virus NOT DETECTED NOT DETECTED Final   Bordetella pertussis NOT DETECTED NOT DETECTED Final   Chlamydophila pneumoniae NOT DETECTED NOT DETECTED Final   Mycoplasma pneumoniae NOT DETECTED NOT DETECTED Final  Blood Culture (routine x 2)     Status: Abnormal   Collection Time: 12/10/16  8:18 PM  Result Value Ref Range Status   Specimen Description BLOOD LEFT HAND  Final   Special Requests IN PEDIATRIC BOTTLE  Final   Culture  Setup Time   Final    GRAM POSITIVE COCCI IN CLUSTERS IN PEDIATRIC BOTTLE CRITICAL RESULT CALLED TO, READ BACK BY AND VERIFIED WITH: A ANDERSON 12/12/16 @ 0807 M VESTAL    Culture (A)  Final    STAPHYLOCOCCUS SPECIES (COAGULASE NEGATIVE) THE SIGNIFICANCE OF ISOLATING THIS ORGANISM FROM A SINGLE SET OF BLOOD CULTURES WHEN MULTIPLE SETS ARE DRAWN IS UNCERTAIN. PLEASE NOTIFY THE MICROBIOLOGY DEPARTMENT WITHIN ONE WEEK IF SPECIATION AND SENSITIVITIES ARE REQUIRED.    Report Status 12/14/2016 FINAL   Final  Blood Culture ID Panel (Reflexed)     Status: Abnormal   Collection Time: 12/10/16  8:18 PM  Result Value Ref Range Status   Enterococcus species NOT DETECTED NOT DETECTED Final   Listeria monocytogenes NOT DETECTED NOT DETECTED Final   Staphylococcus species DETECTED (A) NOT DETECTED Final    Comment: CRITICAL RESULT CALLED TO, READ BACK BY AND VERIFIED WITH: A ANDERSON 12/12/16 @ 0807 M VESTAL    Staphylococcus aureus NOT DETECTED NOT DETECTED Final   Methicillin resistance NOT DETECTED NOT DETECTED Final   Streptococcus species NOT DETECTED NOT DETECTED Final   Streptococcus agalactiae NOT DETECTED NOT DETECTED Final   Streptococcus pneumoniae NOT DETECTED NOT DETECTED Final   Streptococcus pyogenes NOT DETECTED NOT DETECTED Final   Acinetobacter baumannii NOT DETECTED NOT DETECTED Final   Enterobacteriaceae species NOT DETECTED NOT DETECTED Final   Enterobacter cloacae complex NOT DETECTED NOT DETECTED Final   Escherichia coli NOT DETECTED NOT DETECTED Final   Klebsiella oxytoca NOT DETECTED NOT DETECTED Final   Klebsiella pneumoniae NOT DETECTED NOT DETECTED Final   Proteus species NOT DETECTED NOT DETECTED Final   Serratia marcescens NOT DETECTED NOT DETECTED Final   Haemophilus influenzae NOT DETECTED NOT DETECTED Final   Neisseria meningitidis NOT DETECTED NOT DETECTED Final   Pseudomonas aeruginosa NOT DETECTED NOT DETECTED Final   Candida albicans NOT DETECTED NOT DETECTED Final   Candida glabrata NOT DETECTED NOT DETECTED Final   Candida krusei NOT DETECTED NOT DETECTED Final   Candida parapsilosis NOT DETECTED NOT DETECTED Final   Candida tropicalis NOT DETECTED NOT DETECTED Final  Urine culture     Status: None   Collection Time: 12/10/16  9:37 PM  Result Value Ref Range Status  Specimen Description URINE, CATHETERIZED  Final   Special Requests NONE  Final   Culture NO GROWTH  Final   Report Status 12/12/2016 FINAL  Final  Blood Culture (routine x 2)      Status: None (Preliminary result)   Collection Time: 12/10/16 11:08 PM  Result Value Ref Range Status   Specimen Description BLOOD LEFT HAND  Final   Special Requests IN PEDIATRIC BOTTLE  Final   Culture NO GROWTH 4 DAYS  Final   Report Status PENDING  Incomplete     Liver Function Tests:  Recent Labs Lab 12/10/16 2026 12/11/16 0822 12/15/16 1720  AST 17 19  --   ALT 13* 12*  --   ALKPHOS 58 66  --   BILITOT 0.3 0.7  --   PROT 5.5* 5.3*  --   ALBUMIN 2.2* 2.2* 2.2*   No results for input(s): LIPASE, AMYLASE in the last 168 hours.  Recent Labs Lab 12/10/16 2306 12/12/16 1420  AMMONIA 30 30    Cardiac Enzymes: No results for input(s): CKTOTAL, CKMB, CKMBINDEX, TROPONINI in the last 168 hours. BNP (last 3 results)  Recent Labs  10/13/16 1320 11/09/16 1302  BNP 114.8* 79.9    ProBNP (last 3 results) No results for input(s): PROBNP in the last 8760 hours.    Studies: No results found.  Scheduled Meds: . carvedilol  6.25 mg Oral BID WC  . chlorhexidine  15 mL Mouth Rinse BID  . ferrous sulfate  325 mg Oral BID WC  . insulin aspart  0-15 Units Subcutaneous TID WC  . mouth rinse  15 mL Mouth Rinse q12n4p  . pantoprazole (PROTONIX) IV  40 mg Intravenous Q12H  . piperacillin-tazobactam (ZOSYN)  IV  3.375 g Intravenous Q8H  . potassium chloride  40 mEq Oral Once  . pravastatin  20 mg Oral Daily  . torsemide  40 mg Oral Daily      Time spent: 25 min  [facility]   [facility] Pager [phone]. If 7PM-7AM, please contact night-coverage at [email or web address], Office  [phone]  password [email or web address] 12/16/2016, 3:14 PM  LOS: 6 days

## 2016-12-16 NOTE — Progress Notes (Signed)
Physical Therapy Treatment Patient Details Name: Devin Heath MRN: [id number] DOB: [birth year] Today's Date: 12/16/2016    History of Present Illness Pt is an 83 y/o male admitted from a "nursing home facility" secondary to AMS and HCAP. PMH including but not limited to COPD, DM, a-fib and CAD.    PT Comments    Pt requiring +2 max-total assistance with bed mobility. Pt able to sit EOB with max assist initially and decreasing to min guard assist for brief periods. Pt did fatigue quickly but no significant change in BP, SpO2 or HR. Continuing to recommend SNF following acute stay.   Follow Up Recommendations  SNF;Supervision/Assistance - 24 hour     Equipment Recommendations  None recommended by PT    Recommendations for Other Services       Precautions / Restrictions Precautions Precautions: Fall Restrictions Weight Bearing Restrictions: No    Mobility  Bed Mobility Overal bed mobility: Needs Assistance       Supine to sit: +2 for physical assistance;Total assist     General bed mobility comments: assist provided at trunk and LEs to pivot to sitting EOB. Using pad to assist with hips to edge. Pt moaning and reporting pain in LEs during transition but decreasing pain reports with time.   Transfers                 General transfer comment: unsafe to attempt at this time.   Ambulation/Gait                 Stairs            Wheelchair Mobility    Modified Rankin (Stroke Patients Only)       Balance Overall balance assessment: Needs assistance Sitting-balance support: Bilateral upper extremity supported Sitting balance-Leahy Scale: Poor Sitting balance - Comments: Pt intermittently assisting with sitting balance, able to hold self briefly with Rt UE assist. Primarily mod/max assist. Pt attempting to correct balance when leaning toward Rt side.                             Cognition Arousal/Alertness: Awake/alert Behavior  During Therapy: Flat affect Overall Cognitive Status: No family/caregiver present to determine baseline cognitive functioning   Orientation Level: Disoriented to;Place;Situation;Time     Following Commands: Follows one step commands inconsistently     Problem Solving: Slow processing;Decreased initiation;Requires verbal cues;Requires tactile cues;Difficulty sequencing      Exercises      General Comments General comments (skin integrity, edema, etc.): SpO2 remaining 90s and HR in 120s without significant change from rate supine. BP monitored and remaining stable. Recommended airmattress to nursing for pressure relief. Pt positioned toward Rt side after session.       Pertinent Vitals/Pain Pain Assessment: Faces Faces Pain Scale: Hurts even more Pain Location: knees Pain Descriptors / Indicators: Moaning Pain Intervention(s): Limited activity within patient's tolerance;Monitored during session;Repositioned    Home Living                      Prior Function            PT Goals (current goals can now be found in the care plan section) Acute Rehab PT Goals Patient Stated Goal: none stated PT Goal Formulation: Patient unable to participate in goal setting Time For Goal Achievement: 12/26/16 Potential to Achieve Goals: Fair Progress towards PT goals: Progressing toward goals    Frequency    Min  2X/week      PT Plan Current plan remains appropriate    Co-evaluation             End of Session Equipment Utilized During Treatment: Oxygen Activity Tolerance: Patient limited by pain;Patient limited by fatigue Patient left: in bed;with call bell/phone within reach;with bed alarm set     Time: 3007-6226 PT Time Calculation (min) (ACUTE ONLY): 30 min  Charges:  $Therapeutic Activity: 23-37 mins                    G Codes:      Christiane Ha, PT, CSCS Pager [phone] Office [phone]  12/16/2016, 2:50 PM

## 2016-12-16 NOTE — Procedures (Addendum)
Objective Swallowing Evaluation: Type of Study: FEES-Fiberoptic Endoscopic Evaluation of Swallow  Patient Details  Name: Devin Heath MRN: [id number] Date of Birth: Mar 30, 1929  Today's Date: 12/16/2016 Time: SLP Start Time (ACUTE ONLY): 1315-SLP Stop Time (ACUTE ONLY): 1340 SLP Time Calculation (min) (ACUTE ONLY): 25 min  Past Medical History:  Past Medical History:  Diagnosis Date  . A-fib (HCC)   . Arthritis   . CAD (coronary artery disease)   . COPD (chronic obstructive pulmonary disease) (HCC)   . Diabetes (HCC)   . Former smoker   . Gout   . Hard of hearing   . Heart failure (HCC)   . Hyperlipidemia   . IBS (irritable bowel syndrome)   . Ischemic cardiomyopathy   . On home oxygen therapy    "2L q hs" (10/13/2016)  . OSA on CPAP   . Renal insufficiency   . Rheumatoid arthritis (HCC)   . RLS (restless legs syndrome)   . Vitamin D deficiency    Past Surgical History:  Past Surgical History:  Procedure Laterality Date  . APPENDECTOMY    . CARDIAC SURGERY     BY PASS  . COLONOSCOPY WITH PROPOFOL N/A 11/25/2016   Procedure: COLONOSCOPY WITH PROPOFOL;  Surgeon: Iva Boop, MD;  Location[facility];  Service: Endoscopy;  Laterality: N/A;  . ESOPHAGOGASTRODUODENOSCOPY N/A 11/24/2016   Procedure: ESOPHAGOGASTRODUODENOSCOPY (EGD);  Surgeon: Iva Boop, MD;  Location: [facility] ENDOSCOPY;  Service: Endoscopy;  Laterality: N/A;  . FINGER AMPUTATION Left    RING FINGER  . GIVENS CAPSULE STUDY N/A 11/25/2016   Procedure: GIVENS CAPSULE STUDY;  Surgeon: Iva Boop, MD;  Location: [facility] ENDOSCOPY;  Service: Endoscopy;  Laterality: N/A;  . NECK SURGERY    . TONSILLECTOMY     HPI: 83 y.o.malewith medical history significant of A. fib, osteoarthritis, CAD, diastolic dysfunction, COPD, type 2 diabetes, gout, hyperlipidemia, IBS, obstructive sleep apnea on CPAP, renal insufficiency, rheumatoid arthritis, restless leg syndrome who was recently discharged from the hospital due to GI  bleed secondary to anticoagulation for atrial fibrillation, now brought from nursing home facility for evaluation of altered mental status and fever for the past 2 days. Dx HCAP, acute on chronic renal insufficiency. CXR 12/10/16 impression: Mild hazy atelectasis or infiltrate at the right infrahilar lung. Stable borderline to mild cardiomegaly without overt failure. Head CT 12/10/16 showed no acute findings.  Subjective: "Oh, I can't do it"   Assessment / Plan / Recommendation  CHL IP CLINICAL IMPRESSIONS 12/16/2016  Therapy Diagnosis Mild oral phase dysphagia;Moderate pharyngeal phase dysphagia  Clinical Impression SLP experienced difficulty viewing laryngeal area due po's on tip of scope blocking view with only intermittent views of larynx. Silent laryngeal penetration was observed on false vocal cords following nectar thick liquids without observation of airway compromise with honey thick trials although unable to fully assess with suboptimal view.  Pt unable to clear pyriform sinus residue with second swallows. SLP recommends downgrading liquids to honey thick, continue Dys 1 and recommend pt have MBS due to pt's secretions, weak swallow and ability to clear.     Impact on safety and function Moderate aspiration risk      CHL IP TREATMENT RECOMMENDATION 12/16/2016  Treatment Recommendations Therapy as outlined in treatment plan below     Prognosis 12/16/2016  Prognosis for Safe Diet Advancement Good  Barriers to Reach Goals (No Data)  Barriers/Prognosis Comment --    CHL IP DIET RECOMMENDATION 12/16/2016  SLP Diet Recommendations Dysphagia 1 (Puree) solids;Honey thick  liquids  Liquid Administration via Cup;No straw  Medication Administration Whole meds with puree  Compensations Slow rate;Small sips/bites  Postural Changes Seated upright at 90 degrees      CHL IP OTHER RECOMMENDATIONS 12/16/2016  Recommended Consults --  Oral Care Recommendations Oral care BID  Other Recommendations --       CHL IP FOLLOW UP RECOMMENDATIONS 12/16/2016  Follow up Recommendations Skilled Nursing facility      [facility] IP FREQUENCY AND DURATION 12/16/2016  Speech Therapy Frequency (ACUTE ONLY) min 2x/week  Treatment Duration 2 weeks           CHL IP ORAL PHASE 12/16/2016  Oral Phase Impaired  Oral - Pudding Teaspoon --  Oral - Pudding Cup --  Oral - Honey Teaspoon --  Oral - Honey Cup --  Oral - Nectar Teaspoon --  Oral - Nectar Cup (No Data)  Oral - Nectar Straw --  Oral - Thin Teaspoon --  Oral - Thin Cup --  Oral - Thin Straw --  Oral - Puree (No Data)  Oral - Mech Soft --  Oral - Regular --  Oral - Multi-Consistency --  Oral - Pill --  Oral Phase - Comment --    CHL IP PHARYNGEAL PHASE 12/16/2016  Pharyngeal Phase Impaired  Pharyngeal- Pudding Teaspoon --  Pharyngeal --  Pharyngeal- Pudding Cup --  Pharyngeal --  Pharyngeal- Honey Teaspoon --  Pharyngeal --  Pharyngeal- Honey Cup --  Pharyngeal --  Pharyngeal- Nectar Teaspoon --  Pharyngeal --  Pharyngeal- Nectar Cup Delayed swallow initiation-vallecula;Penetration/Aspiration during swallow;Pharyngeal residue - pyriform;Reduced laryngeal elevation  Pharyngeal Material enters airway, CONTACTS cords and not ejected out;Material enters airway, remains ABOVE vocal cords and not ejected out  Pharyngeal- Nectar Straw --  Pharyngeal --  Pharyngeal- Thin Teaspoon --  Pharyngeal --  Pharyngeal- Thin Cup --  Pharyngeal --  Pharyngeal- Thin Straw --  Pharyngeal --  Pharyngeal- Puree Delayed swallow initiation-vallecula;Pharyngeal residue - pyriform;Reduced tongue base retraction  Pharyngeal --  Pharyngeal- Mechanical Soft --  Pharyngeal --  Pharyngeal- Regular --  Pharyngeal --  Pharyngeal- Multi-consistency --  Pharyngeal --  Pharyngeal- Pill --  Pharyngeal --  Pharyngeal Comment --     CHL IP CERVICAL ESOPHAGEAL PHASE 12/16/2016  Cervical Esophageal Phase (No Data)  Pudding Teaspoon --  Pudding Cup --  Honey  Teaspoon --  Honey Cup --  Nectar Teaspoon --  Nectar Cup --  Nectar Straw --  Thin Teaspoon --  Thin Cup --  Thin Straw --  Puree --  Mechanical Soft --  Regular --  Multi-consistency --  Pill --  Cervical Esophageal Comment --    No flowsheet data found.  Royce Macadamia 12/16/2016, 3:49 PM   Breck Coons Lonell Face.Ed [employer] [phone]

## 2016-12-16 NOTE — Consult Note (Addendum)
Consultation Note Date: 12/16/2016   Patient Name: Devin Heath  DOB: [birth year]  MRN: [id number]  Age / Sex: 83 y.o., male  PCP: Josetta Huddle, MD Referring Physician: Oswald Hillock, MD  Reason for Consultation: Establishing goals of care  HPI/Patient Profile: 83 y.o. male  with past medical history of A fib, osteoarthrtis, CAD, CHF (diastolic dysfunction), COPD, DMII, gout, hyperlipidemia, IBS, ostructive sleep apnea on CPAP, CKD stage IV admitted on 12/10/2016 with altered mental status and fever. He was recently discharged to SNF for rehab after a hospital admission for GI bleed d/t anticoag for Afib. Workup reveals suspected HCAP with sepsis, CT shows bilateral pleural effusions.    Clinical Assessment and Goals of Care: I met with patient's spouse, Romie Minus,  and daughter in Sports coach, Joanne Chars. They are familiar with palliative medicine having seen Dr. Domingo Cocking during last admission at which time a MOST was completed and GOC were to go to rehab with hopes of returning home. We discussed patient's medical state and poor prognosis at length. Patient has multiple chronic medical problems that along with the acute sepsis and pneumonia, poor nutritional status (albumin 2.2), cognitive status and functional status (PPS 20%), indicate a very poor prognosis.  Romie Minus noted that she feels patient is doing poorly and "I don't see a way to him getting better". Romie Minus and Joanne Chars said their goals would be to ensure patient is comfortable and and not to prolong suffering. However, they would like a few more days to discuss with family, and to receive results of swallow evaluation. They do know they would not want a tube feeding and would be accepting to comfort feeding. There is another daughter who is a Retail buyer and is flying in tomorrow. They would like to have her input on this decision as well.  They are concerned about his complaints  of pain. He has history of gout, arthritis, and they suspect a decubitis ulcer on his sacrum. His shingles pain that was addressed by Dr. Domingo Cocking on last admission resolved, however, he continues to complain of pain in his legs and back.   Primary Decision Maker NEXT OF KIN - spouse- Romie Minus    SUMMARY OF RECOMMENDATIONS -Continue current level of care, do not escalate -Morphine intensol 27m SL q2 hr prn pain or SOB -Family considering transition to comfort care- [facility]  -Delirium precautions  Code Status/Advance Care Planning:  DNR  Palliative Prophylaxis:   Delirium Protocol  Additional Recommendations (Limitations, Scope, Preferences):  No Artificial Feeding, No Surgical Procedures and No Tracheostomy  Psycho-social/Spiritual:   Desire for further Chaplaincy support:Yes  Additional Recommendations: Education on Hospice  Prognosis:    Unable to determine will depend on decision for comfort care  Discharge Planning: To Be Determined  Primary Diagnoses: Present on Admission: . HCAP (healthcare-associated pneumonia) . Atrial fibrillation with rapid ventricular response ([place] . CAD (coronary artery disease) . Hyperlipidemia . Lactic acidosis . Uncontrolled type 2 diabetes mellitus with complication ([place]  I have reviewed the medical record, interviewed the patient and family, and examined the patient. The following aspects are pertinent.  Past Medical History:  Diagnosis Date  . A-fib ([place])   . Arthritis   . CAD (coronary artery disease)   . COPD (chronic obstructive pulmonary disease) ([place])   . Diabetes ([place])   . Former smoker   . Gout   . Hard of hearing   . Heart failure ([place])   . Hyperlipidemia   . IBS (irritable bowel syndrome)   . Ischemic cardiomyopathy   . On home oxygen therapy    "2L q hs" (10/13/2016)  . OSA on CPAP   . Renal insufficiency   . Rheumatoid  arthritis ([place])   . RLS (restless legs syndrome)   . Vitamin D deficiency    Social History   Social History  . Marital status: Married    Spouse name: JEAN  . Number of children: 3  . Years of education: COLLEGE   Occupational History  . RETIRED    Social History Main Topics  . Smoking status: Former Research scientist (life sciences)  . Smokeless tobacco: Never Used  . Alcohol use No  . Drug use: No  . Sexual activity: Not Asked   Other Topics Concern  . None   Social History Narrative   Lives with with, just relocated to [place] 2 months ago per son.   Family History  Problem Relation Age of Onset  . CAD Mother   . CAD Father    Scheduled Meds: . carvedilol  6.25 mg Oral BID WC  . chlorhexidine  15 mL Mouth Rinse BID  . ferrous sulfate  325 mg Oral BID WC  . insulin aspart  0-15 Units Subcutaneous TID WC  . mouth rinse  15 mL Mouth Rinse q12n4p  . pantoprazole (PROTONIX) IV  40 mg Intravenous Q12H  . piperacillin-tazobactam (ZOSYN)  IV  3.375 g Intravenous Q8H  . potassium chloride  40 mEq Oral Once  . pravastatin  20 mg Oral Daily  . torsemide  40 mg Oral Daily   Continuous Infusions: . diltiazem (CARDIZEM) infusion 10 mg/hr (12/16/16 0449)   PRN Meds:.HYDROcodone-acetaminophen, RESOURCE THICKENUP CLEAR, sodium chloride flush Medications Prior to Admission:  Prior to Admission medications   Medication Sig Start Date End Date Taking? Authorizing Provider  acetaminophen (TYLENOL) 500 MG tablet Take 500 mg by mouth every 6 (six) hours as needed for moderate pain.    Yes Historical Provider, MD  albuterol (PROVENTIL HFA;VENTOLIN HFA) 108 (90 Base) MCG/ACT inhaler Inhale 2 puffs into the lungs every 6 (six) hours as needed for wheezing or shortness of breath.   Yes Historical Provider, MD  allopurinol (ZYLOPRIM) 100 MG tablet Take 100 mg by mouth daily.   Yes Historical Provider, MD  carvedilol (COREG) 25 MG tablet Take 25 mg by mouth 2 (two) times daily with a meal.   Yes Historical Provider,  MD  cetirizine (ZYRTEC) 5 MG tablet Take 5 mg by mouth daily.    Yes Historical Provider, MD  Cholecalciferol (VITAMIN D3) 1000 units CAPS Take 1,000 Units by mouth daily.    Yes Historical Provider, MD  cholestyramine (QUESTRAN) 4 g packet Take 4 g by mouth 2 (two) times daily.   Yes Historical Provider, MD  ciprofloxacin (CIPRO IN D5W) 400 MG/200ML SOLN Inject 400 mg into the vein daily.   Yes Historical Provider, MD  Coenzyme Q10 (CO Q-10) 100 MG CAPS Take 100 mg by mouth daily.    Yes  Historical Provider, MD  diphenhydrAMINE (ALLERGY RELIEF) 25 mg capsule Take 25 mg by mouth every 6 (six) hours as needed for itching or allergies.    Yes Historical Provider, MD  gabapentin (NEURONTIN) 300 MG capsule Take 300 mg by mouth 3 (three) times daily.   Yes Historical Provider, MD  HYDROcodone-acetaminophen (NORCO/VICODIN) 5-325 MG tablet Take 1 tablet by mouth every 8 (eight) hours as needed for moderate pain or severe pain. 12/01/16  Yes Jennifer Chahn-Yang Choi, DO  insulin glargine (LANTUS) 100 UNIT/ML injection Inject 10 Units into the skin at bedtime.   Yes Historical Provider, MD  insulin glulisine (APIDRA) 100 UNIT/ML injection Inject 14 Units into the skin 3 (three) times daily before meals.   Yes Historical Provider, MD  metroNIDAZOLE (FLAGYL) 5-0.79 MG/ML-% IVPB Inject 500 mg into the vein every 8 (eight) hours.   Yes Historical Provider, MD  mirtazapine (REMERON) 15 MG tablet Take 15 mg by mouth at bedtime.   Yes Historical Provider, MD  omeprazole (PRILOSEC) 40 MG capsule Take 1 capsule (40 mg total) by mouth daily. 12/01/16  Yes Jennifer Chahn-Yang Choi, DO  pravastatin (PRAVACHOL) 20 MG tablet Take 20 mg by mouth daily.   Yes Historical Provider, MD  promethazine (PHENERGAN) 25 MG tablet Take 25 mg by mouth every 6 (six) hours as needed for nausea or vomiting.   Yes Historical Provider, MD  terazosin (HYTRIN) 2 MG capsule Take 2 mg by mouth at bedtime.   Yes Historical Provider, MD  tiotropium  (SPIRIVA) 18 MCG inhalation capsule Place 18 mcg into inhaler and inhale daily.   Yes Historical Provider, MD  torsemide (DEMADEX) 20 MG tablet Take 1 tablet (20 mg total) by mouth daily. 12/01/16  Yes Jennifer Chahn-Yang Choi, DO  umeclidinium bromide (INCRUSE ELLIPTA) 62.5 MCG/INH AEPB Inhale 1 puff into the lungs daily.   Yes Historical Provider, MD  valACYclovir (VALTREX) 1000 MG tablet Take 1,000 mg by mouth 2 (two) times daily.   Yes Historical Provider, MD  pregabalin (LYRICA) 75 MG capsule Take 1 capsule (75 mg total) by mouth 2 (two) times daily. 12/01/16   Shon Millet, DO   Allergies  Allergen Reactions  . Ace Inhibitors Cough  . Ambrisentan Other (See Comments)    On MAR  . Clindamycin/Lincomycin Other (See Comments)    On MAR  . Erythromycin Other (See Comments)    On MAR  . Feldene [Piroxicam] Other (See Comments)    On MAR  . Keflex [Cephalexin] Other (See Comments)    On MAR  . Omnicef [Cefdinir] Other (See Comments)    On MAR  . Simvastatin Other (See Comments)    On MAR   Review of Systems  Unable to perform ROS: Mental status change    Physical Exam  Cardiovascular: Normal rate and regular rhythm.   Neurological:  Distracted gaze, somnolent but arouses  Skin: Skin is warm and dry.  Nursing note and vitals reviewed.   Vital Signs: BP (!) 131/58 (BP Location: Left Arm)   Pulse 99   Temp 98 F (36.7 C) (Oral)   Resp 20   Ht 5' 7"  (1.702 m)   Wt 84.5 kg (186 lb 4.6 oz)   SpO2 97%   BMI 29.18 kg/m  Pain Assessment: 0-10 POSS *See Group Information*: 2-Acceptable,Slightly drowsy, easily aroused Pain Score: Asleep   SpO2: SpO2: 97 % O2 Device:SpO2: 97 % O2 Flow Rate: .O2 Flow Rate (L/min): 2 L/min  IO: Intake/output summary:  Intake/Output Summary (Last 24  hours) at 12/16/16 1320 Last data filed at 12/16/16 1200  Gross per 24 hour  Intake            342.5 ml  Output                0 ml  Net            342.5 ml    LBM: Last BM Date:  12/15/16 Baseline Weight: Weight: 88.5 kg (195 lb) Most recent weight: Weight: 84.5 kg (186 lb 4.6 oz)     Palliative Assessment/Data: PPS: 20%     Thank you for this consult. Palliative medicine will continue to follow and assist as needed.   Time Total: 85 minutes Greater than 50%  of this time was spent counseling and coordinating care related to the above assessment and plan.  Signed by: Mariana Kaufman, AGNP-C Palliative Medicine    Please contact Palliative Medicine Team phone at [phone] for questions and concerns.  For individual provider: See Shea Evans

## 2016-12-17 ENCOUNTER — Inpatient Hospital Stay (HOSPITAL_COMMUNITY): Payer: Medicare Other

## 2016-12-17 DIAGNOSIS — D62 Acute posthemorrhagic anemia: Secondary | ICD-10-CM

## 2016-12-17 DIAGNOSIS — Z7189 Other specified counseling: Secondary | ICD-10-CM

## 2016-12-17 DIAGNOSIS — Z515 Encounter for palliative care: Secondary | ICD-10-CM

## 2016-12-17 DIAGNOSIS — R52 Pain, unspecified: Secondary | ICD-10-CM

## 2016-12-17 LAB — BASIC METABOLIC PANEL
Anion gap: 17 — ABNORMAL HIGH (ref 5–15)
BUN: 20 mg/dL (ref 6–20)
CO2: 31 mmol/L (ref 22–32)
CREATININE: 2.45 mg/dL — AB (ref 0.61–1.24)
Calcium: 8.4 mg/dL — ABNORMAL LOW (ref 8.9–10.3)
Chloride: 105 mmol/L (ref 101–111)
GFR calc Af Amer: 26 mL/min — ABNORMAL LOW (ref >60)
GFR, EST NON AFRICAN AMERICAN: 22 mL/min — AB (ref >60)
GLUCOSE: 163 mg/dL — AB (ref 65–99)
POTASSIUM: 3.2 mmol/L — AB (ref 3.5–5.1)
SODIUM: 153 mmol/L — AB (ref 135–145)

## 2016-12-17 LAB — GLUCOSE, CAPILLARY
Glucose-Capillary: 163 mg/dL — ABNORMAL HIGH (ref 65–99)
Glucose-Capillary: 168 mg/dL — ABNORMAL HIGH (ref 65–99)
Glucose-Capillary: 145 mg/dL — ABNORMAL HIGH (ref 65–99)
Glucose-Capillary: 115 mg/dL — ABNORMAL HIGH (ref 65–99)

## 2016-12-17 LAB — CBC
HCT: 31.4 % — ABNORMAL LOW (ref 39.0–52.0)
HEMATOCRIT: 23.1 % — AB (ref 39.0–52.0)
Hemoglobin: 6.6 g/dL — CL (ref 13.0–17.0)
Hemoglobin: 9.1 g/dL — ABNORMAL LOW (ref 13.0–17.0)
MCH: 25.5 pg — ABNORMAL LOW (ref 26.0–34.0)
MCH: 25.6 pg — ABNORMAL LOW (ref 26.0–34.0)
MCHC: 28.6 g/dL — AB (ref 30.0–36.0)
MCHC: 29 g/dL — ABNORMAL LOW (ref 30.0–36.0)
MCV: 89.2 fL (ref 78.0–100.0)
MCV: 88.2 fL (ref 78.0–100.0)
PLATELETS: 311 10*3/uL (ref 150–400)
Platelets: 280 10*3/uL (ref 150–400)
RBC: 2.59 MIL/uL — ABNORMAL LOW (ref 4.22–5.81)
RBC: 3.56 MIL/uL — AB (ref 4.22–5.81)
RDW: 16 % — AB (ref 11.5–15.5)
RDW: 15.5 % (ref 11.5–15.5)
WBC: 8.2 10*3/uL (ref 4.0–10.5)
WBC: 7 10*3/uL (ref 4.0–10.5)

## 2016-12-17 LAB — PREPARE RBC (CROSSMATCH)

## 2016-12-17 MED ORDER — DEXTROSE 5 % IV SOLN
10.0000 mg/h | INTRAVENOUS | Status: DC
  Administered 2016-12-17 – 2016-12-18 (×2): 10 mg/h via INTRAVENOUS
  Filled 2016-12-17 (×2): qty 100

## 2016-12-17 MED ORDER — POTASSIUM CHLORIDE 20 MEQ PO PACK
40.0000 meq | PACK | Freq: Once | ORAL | Status: AC
  Administered 2016-12-17: 40 meq via ORAL
  Filled 2016-12-17: qty 2

## 2016-12-17 MED ORDER — POTASSIUM CHLORIDE 20 MEQ PO PACK
40.0000 meq | PACK | Freq: Once | ORAL | Status: DC
  Filled 2016-12-17: qty 2

## 2016-12-17 MED ORDER — DILTIAZEM HCL ER COATED BEADS 180 MG PO CP24
360.0000 mg | ORAL_CAPSULE | Freq: Every day | ORAL | Status: DC
  Filled 2016-12-17: qty 2

## 2016-12-17 MED ORDER — SODIUM CHLORIDE 0.9 % IV SOLN
Freq: Once | INTRAVENOUS | Status: AC
  Administered 2016-12-17: 08:00:00 via INTRAVENOUS

## 2016-12-17 MED ORDER — SODIUM CHLORIDE 0.9 % IV SOLN
30.0000 meq | Freq: Once | INTRAVENOUS | Status: AC
  Administered 2016-12-17: 30 meq via INTRAVENOUS
  Filled 2016-12-17: qty 15

## 2016-12-17 NOTE — Progress Notes (Signed)
[facility]  PROGRESS NOTE  Devin Heath TDD:[id number] DOB: [birth year] DOA: 12/10/2016 PCP: [name], MD   Brief HPI:   83 y.o.malewith medical history significant of A. fib, osteoarthritis, CAD, diastolic dysfunction, COPD, type 2 diabetes, gout, hyperlipidemia, IBS, obstructive sleep apnea on CPAP, renal insufficiency, rheumatoid arthritis, restless leg syndrome who was recently discharged from the hospital due to GI bleed secondary to anticoagulation for atrial fibrillation, now brought from nursing home facility for evaluation of altered mental status and fever for the past 2 days. The patient was receiving ceftriaxone through [facility] line at the [facility]    Subjective   Patient complains of generalized body aches.   Assessment/Plan:     1. Sepsis- due to healthcare associated pneumonia, continue Zosyn, vancomycin stop on 12/13/2016. Repeat CT chest showed bilateral pleural effusions. Lactic acid is now clear. Continue Zosyn. Blood cultures 1 out of 2 bottles growing coagulase-negative staph. 2. Acute hypoxic respiratory failure- multifactorial from pneumonia as well as acute diastolic CHF. Patient was started on on Lasix 80 mg IV twice a day per cardiology. Echo  showed stable EF and difficult to assess vascular dysfunction. Lasix has now a transitioned  to torsemide 40 mg daily. Will follow BMP in a.m. 3. CAD status post CABG- patient on aspirin, continue statins. 4. Hypernatremia- still has elevated sodium 153. Follow BMP in a.m. 5. Hypokalemia-potassium is 3.2, will replace potassium and check bmp, in am. 6. Atrial fibrillation- CHA2DS2VASc score is 4. Started on Cardizem drip, metoprolol. Not anti-coagulated due to significant GI bleed. 7. Acute on chronic kidney stage III-today creatinine 2.02 due to high-dose diuresis. Lasix now has been changed to by mouth torsemide. Will follow BMP in a.m. 8. Obstructive sleep apnea-continue CPAP 9. 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 Blood glucose is stable. 10. Anemia of chronic disease- patient had recent GI bleed, hemoglobin is 6.6 today. One unit PRBC transfused. Will follow CBC in a.m. 11. Dysphagia- MBS show high risk of aspiration with all consistencies, speech therapy recommends Dys 1 diet if family agrees to comfort care. Palliative care following, family to discuss comfort care.    DVT prophylaxis: SCDs  Code Status: DO NOT RESUSCITATE  Family Communication: No family present at bedside   Disposition Plan: Pending improvement in respiratory status   Consultants:  Cardiology  Procedures:  None  Continuous infusions . diltiazem (CARDIZEM) infusion        Antibiotics:   Anti-infectives    Start     Dose/Rate Route Frequency Ordered Stop   12/11/16 1600  vancomycin (VANCOCIN) IVPB 1000 mg/200 mL premix  Status:  Discontinued     1,000 mg 200 mL/hr over 60 Minutes Intravenous Every 24 hours 12/11/16 1445 12/13/16 1738   12/11/16 1500  vancomycin (VANCOCIN) 1,250 mg in sodium chloride 0.9 % 250 mL IVPB  Status:  Discontinued     1,250 mg 166.7 mL/hr over 90 Minutes Intravenous Every 24 hours 12/10/16 1953 12/11/16 1445   12/11/16 0200  piperacillin-tazobactam (ZOSYN) IVPB 3.375 g     3.375 g 12.5 mL/hr over 240 Minutes Intravenous Every 8 hours 12/10/16 1953 12/17/16 2359   12/10/16 1945  piperacillin-tazobactam (ZOSYN) IVPB 3.375 g     3.375 g 100 mL/hr over 30 Minutes Intravenous  Once 12/10/16 1942 12/10/16 2100   12/10/16 1945  vancomycin (VANCOCIN) IVPB 1000 mg/200 mL premix     1,000 mg 200 mL/hr over 60 Minutes Intravenous  Once 12/10/16 1942 12/10/16 2141  Objective   Vitals:   12/17/16 0800 12/17/16 1309 12/17/16 1358 12/17/16 1400  BP: 117/69 124/71 123/68 123/68  Pulse: (!) 107 (!) 133  (!) 106  Resp: 20 (!) 23 (!) 22   Temp:  99.2 F (37.3 C) 98.2 F (36.8 C) 98.2 F (36.8 C)  TempSrc:   Axillary Oral Oral  SpO2: 100% 100%  100%  Weight:      Height:        Intake/Output Summary (Last 24 hours) at 12/17/16 1621 Last data filed at 12/17/16 0700  Gross per 24 hour  Intake               50 ml  Output              900 ml  Net             -850 ml   Filed Weights   12/15/16 1026 12/16/16 0446 12/17/16 0400  Weight: 85.5 kg (188 lb 7.9 oz) 84.5 kg (186 lb 4.6 oz) 82.3 kg (181 lb 7 oz)     Physical Examination:  General exam: Appears calm and comfortable. Respiratory system: Clear to auscultation. Respiratory effort normal. Cardiovascular system:  RRR. No  murmurs, rubs, gallops. Trace edema lower extremities bilaterally GI system: Abdomen is nondistended, soft and nontender. No organomegaly.  Central nervous system. No focal neurological deficits. 5 x 5 power in all extremities. Skin: No rashes, lesions or ulcers. Psychiatry: Alert, oriented x 3.Judgement and insight appear normal. Affect normal.    Data Reviewed: I have personally reviewed following labs and imaging studies  CBG:  Recent Labs Lab 12/16/16 1224 12/16/16 1614 12/16/16 2108 12/17/16 0735 12/17/16 1307  GLUCAP 165* 145* 120* 163* 168*    CBC:  Recent Labs Lab 12/10/16 2026  12/11/16 0822 12/12/16 0438 12/13/16 0600 12/14/16 0600 12/15/16 0430 12/16/16 0432 12/17/16 0500  WBC 10.4  --  9.7 8.5 6.7 7.7 8.0 8.2 8.2  NEUTROABS 8.8*  --  8.4* 7.6 5.6  --   --   --   --   HGB 7.8*  < > 7.6* 7.6* 7.7* 8.3* 7.5* 8.2* 6.6*  HCT 26.1*  --  25.3* 25.2* 25.4* 27.9* 25.3* 28.0* 23.1*  MCV 87.6  --  88.5 87.5 87.3 87.2 87.5 87.8 89.2  PLT 152  --  154 173 201 250 246 271 311  < > = values in this interval not displayed.  Basic Metabolic Panel:  Recent Labs Lab 12/10/16 2329  12/12/16 0438 12/13/16 0600 12/14/16 0600 12/15/16 0430 12/15/16 1720 12/16/16 0432 12/17/16 0500  NA  --   < > 146* 148* 148* 151* 152* 151* 153*  K  --   < > 3.9 3.4* 3.2* 3.0* 2.9* 2.9* 3.2*  CL  --   < >  115* 113* 112* 111 109 104 105  CO2  --   < > 23 23 26 29  32 32 31  GLUCOSE  --   < > 185* 177* 181* 203* 176* 189* 163*  BUN  --   < > 25* 25* 19 18 18 17 20   CREATININE  --   < > 1.70* 1.79* 1.86* 1.89* 1.89* 2.02* 2.45*  CALCIUM  --   < > 8.2* 8.2* 8.3* 8.2* 8.6* 8.5* 8.4*  MG 1.6*  --  2.6* 2.3  --   --   --   --   --   PHOS 3.5  --   --   --   --   --   --   --   --   < > =  values in this interval not displayed.  Recent Results (from the past 240 hour(s))  Respiratory Panel by PCR     Status: None   Collection Time: 12/10/16  7:58 PM  Result Value Ref Range Status   Adenovirus NOT DETECTED NOT DETECTED Final   Coronavirus 229E NOT DETECTED NOT DETECTED Final   Coronavirus HKU1 NOT DETECTED NOT DETECTED Final   Coronavirus NL63 NOT DETECTED NOT DETECTED Final   Coronavirus OC43 NOT DETECTED NOT DETECTED Final   Metapneumovirus NOT DETECTED NOT DETECTED Final   Rhinovirus / Enterovirus NOT DETECTED NOT DETECTED Final   Influenza A NOT DETECTED NOT DETECTED Final   Influenza B NOT DETECTED NOT DETECTED Final   Parainfluenza Virus 1 NOT DETECTED NOT DETECTED Final   Parainfluenza Virus 2 NOT DETECTED NOT DETECTED Final   Parainfluenza Virus 3 NOT DETECTED NOT DETECTED Final   Parainfluenza Virus 4 NOT DETECTED NOT DETECTED Final   Respiratory Syncytial Virus NOT DETECTED NOT DETECTED Final   Bordetella pertussis NOT DETECTED NOT DETECTED Final   Chlamydophila pneumoniae NOT DETECTED NOT DETECTED Final   Mycoplasma pneumoniae NOT DETECTED NOT DETECTED Final  Blood Culture (routine x 2)     Status: Abnormal   Collection Time: 12/10/16  8:18 PM  Result Value Ref Range Status   Specimen Description BLOOD LEFT HAND  Final   Special Requests IN PEDIATRIC BOTTLE  Final   Culture  Setup Time   Final    GRAM POSITIVE COCCI IN CLUSTERS IN PEDIATRIC BOTTLE CRITICAL RESULT CALLED TO, READ BACK BY AND VERIFIED WITH: A ANDERSON 12/12/16 @ 0807 M VESTAL    Culture (A)  Final     STAPHYLOCOCCUS SPECIES (COAGULASE NEGATIVE) THE SIGNIFICANCE OF ISOLATING THIS ORGANISM FROM A SINGLE SET OF BLOOD CULTURES WHEN MULTIPLE SETS ARE DRAWN IS UNCERTAIN. PLEASE NOTIFY THE MICROBIOLOGY DEPARTMENT WITHIN ONE WEEK IF SPECIATION AND SENSITIVITIES ARE REQUIRED.    Report Status 12/14/2016 FINAL  Final  Blood Culture ID Panel (Reflexed)     Status: Abnormal   Collection Time: 12/10/16  8:18 PM  Result Value Ref Range Status   Enterococcus species NOT DETECTED NOT DETECTED Final   Listeria monocytogenes NOT DETECTED NOT DETECTED Final   Staphylococcus species DETECTED (A) NOT DETECTED Final    Comment: CRITICAL RESULT CALLED TO, READ BACK BY AND VERIFIED WITH: A ANDERSON 12/12/16 @ 0807 M VESTAL    Staphylococcus aureus NOT DETECTED NOT DETECTED Final   Methicillin resistance NOT DETECTED NOT DETECTED Final   Streptococcus species NOT DETECTED NOT DETECTED Final   Streptococcus agalactiae NOT DETECTED NOT DETECTED Final   Streptococcus pneumoniae NOT DETECTED NOT DETECTED Final   Streptococcus pyogenes NOT DETECTED NOT DETECTED Final   Acinetobacter baumannii NOT DETECTED NOT DETECTED Final   Enterobacteriaceae species NOT DETECTED NOT DETECTED Final   Enterobacter cloacae complex NOT DETECTED NOT DETECTED Final   Escherichia coli NOT DETECTED NOT DETECTED Final   Klebsiella oxytoca NOT DETECTED NOT DETECTED Final   Klebsiella pneumoniae NOT DETECTED NOT DETECTED Final   Proteus species NOT DETECTED NOT DETECTED Final   Serratia marcescens NOT DETECTED NOT DETECTED Final   Haemophilus influenzae NOT DETECTED NOT DETECTED Final   Neisseria meningitidis NOT DETECTED NOT DETECTED Final   Pseudomonas aeruginosa NOT DETECTED NOT DETECTED Final   Candida albicans NOT DETECTED NOT DETECTED Final   Candida glabrata NOT DETECTED NOT DETECTED Final   Candida krusei NOT DETECTED NOT DETECTED Final   Candida parapsilosis NOT DETECTED NOT DETECTED  Final   Candida tropicalis NOT DETECTED  NOT DETECTED Final  Urine culture     Status: None   Collection Time: 12/10/16  9:37 PM  Result Value Ref Range Status   Specimen Description URINE, CATHETERIZED  Final   Special Requests NONE  Final   Culture NO GROWTH  Final   Report Status 12/12/2016 FINAL  Final  Blood Culture (routine x 2)     Status: None   Collection Time: 12/10/16 11:08 PM  Result Value Ref Range Status   Specimen Description BLOOD LEFT HAND  Final   Special Requests IN PEDIATRIC BOTTLE  Final   Culture NO GROWTH 5 DAYS  Final   Report Status 12/16/2016 FINAL  Final     Liver Function Tests:  Recent Labs Lab 12/10/16 2026 12/11/16 0822 12/15/16 1720  AST 17 19  --   ALT 13* 12*  --   ALKPHOS 58 66  --   BILITOT 0.3 0.7  --   PROT 5.5* 5.3*  --   ALBUMIN 2.2* 2.2* 2.2*   No results for input(s): LIPASE, AMYLASE in the last 168 hours.  Recent Labs Lab 12/10/16 2306 12/12/16 1420  AMMONIA 30 30    Cardiac Enzymes: No results for input(s): CKTOTAL, CKMB, CKMBINDEX, TROPONINI in the last 168 hours. BNP (last 3 results)  Recent Labs  10/13/16 1320 11/09/16 1302  BNP 114.8* 79.9    ProBNP (last 3 results) No results for input(s): PROBNP in the last 8760 hours.    Studies: Dg Swallowing Func-speech Pathology  Result Date: 12/17/2016 Objective Swallowing Evaluation: Type of Study: MBS-Modified Barium Swallow Study Patient Details Name: Flora Ratz MRN: [id number] Date of Birth: 29-Dec-1928 Today's Date: 12/17/2016 Time: SLP Start Time (ACUTE ONLY): 1047-SLP Stop Time (ACUTE ONLY): 1108 SLP Time Calculation (min) (ACUTE ONLY): 21 min Past Medical History: Past Medical History: Diagnosis Date . A-fib (HCC)  . Arthritis  . CAD (coronary artery disease)  . COPD (chronic obstructive pulmonary disease) (HCC)  . Diabetes (HCC)  . Former smoker  . Gout  . Hard of hearing  . Heart failure (HCC)  . Hyperlipidemia  . IBS (irritable bowel syndrome)  . Ischemic cardiomyopathy  . On home oxygen  therapy   "2L q hs" (10/13/2016) . OSA on CPAP  . Renal insufficiency  . Rheumatoid arthritis (HCC)  . RLS (restless legs syndrome)  . Vitamin D deficiency  Past Surgical History: Past Surgical History: Procedure Laterality Date . APPENDECTOMY   . CARDIAC SURGERY    BY PASS . COLONOSCOPY WITH PROPOFOL N/A 11/25/2016  Procedure: COLONOSCOPY WITH PROPOFOL;  Surgeon: Iva Boop, MD;  Location[facility] ENDOSCOPY;  Service: Endoscopy;  Laterality: N/A; . ESOPHAGOGASTRODUODENOSCOPY N/A 11/24/2016  Procedure: ESOPHAGOGASTRODUODENOSCOPY (EGD);  Surgeon: Iva Boop, MD;  Location: [facility] ENDOSCOPY;  Service: Endoscopy;  Laterality: N/A; . FINGER AMPUTATION Left   RING FINGER . GIVENS CAPSULE STUDY N/A 11/25/2016  Procedure: GIVENS CAPSULE STUDY;  Surgeon: Iva Boop, MD;  Location: [facility] ENDOSCOPY;  Service: Endoscopy;  Laterality: N/A; . NECK SURGERY   . TONSILLECTOMY   HPI: 83 y.o.malewith medical history significant of A. fib, osteoarthritis, CAD, diastolic dysfunction, COPD, type 2 diabetes, gout, hyperlipidemia, IBS, obstructive sleep apnea on CPAP, renal insufficiency, rheumatoid arthritis, restless leg syndrome who was recently discharged from the hospital due to GI bleed secondary to anticoagulation for atrial fibrillation, now brought from nursing home facility for evaluation of altered mental status and fever for the past 2 days. Dx HCAP,  acute on chronic renal insufficiency. CXR 12/10/16 impression: Mild hazy atelectasis or infiltrate at the right infrahilar lung. Stable borderline to mild cardiomegaly without overt failure. Head CT 12/10/16 showed no acute findings. FEES 1/24 unable to fully determine safety due to obstucted view (po's on scope tip), therefore MBS today.  Subjective: "Oh, I can't do it" Assessment / Plan / Recommendation CHL IP CLINICAL IMPRESSIONS 12/17/2016 Therapy Diagnosis Mild oral phase dysphagia;Moderate pharyngeal phase dysphagia Clinical Impression Pt exhibits mild oral and moderate pharyngeal  dysphagia. Oral deficits included decreased oral cohesion resulting in sublingual spilling and prolonged oral transit time. Pt presented with a suspected structual based pharyngeal dysphagia due to presence of bony structure (osteophyte? not confirmed by radiologist) at the level of cervical vertebrae preventing epiglottic inversion resulting in silent penetration to the level of the vocal cords with thin liquids via straw. Pt may have experienced premorbid swallowing difficulties due to cervical protrusion and compensated for however now unable due to present illness and decreased functional reserve. Laryngeal elevation mildly decreased. He exhibited moderate vallecular/pyriform sinuses residue and was unable to perform second volitional swallow and mildly delayed swallow initiation. Pt is at higher aspiration risk with all consistencies. If family agreeable to comfort feeds, SLP recommends Dys 1 diet, thin liquids (no straws), meds crushed in applesauce. Will follow for education needs with family.  Impact on safety and function Severe aspiration risk   CHL IP TREATMENT RECOMMENDATION 12/17/2016 Treatment Recommendations Therapy as outlined in treatment plan below   Prognosis 12/17/2016 Prognosis for Safe Diet Advancement (No Data) Barriers to Reach Goals Cognitive deficits Barriers/Prognosis Comment -- CHL IP DIET RECOMMENDATION 12/17/2016 SLP Diet Recommendations Thin liquid;Dysphagia 1 (Puree) solids Liquid Administration via Cup;No straw Medication Administration Crushed with puree Compensations Slow rate;Small sips/bites Postural Changes Seated upright at 90 degrees   CHL IP OTHER RECOMMENDATIONS 12/17/2016 Recommended Consults -- Oral Care Recommendations Oral care BID Other Recommendations --   CHL IP FOLLOW UP RECOMMENDATIONS 12/17/2016 Follow up Recommendations Skilled Nursing facility   [facility] IP FREQUENCY AND DURATION 12/17/2016 Speech Therapy Frequency (ACUTE ONLY) min 2x/week Treatment Duration 2 weeks       CHL IP ORAL PHASE 12/17/2016 Oral Phase Impaired Oral - Pudding Teaspoon -- Oral - Pudding Cup -- Oral - Honey Teaspoon -- Oral - Honey Cup -- Oral - Nectar Teaspoon -- Oral - Nectar Cup Decreased bolus cohesion;Lingual/palatal residue;Weak lingual manipulation;Delayed oral transit Oral - Nectar Straw -- Oral - Thin Teaspoon -- Oral - Thin Cup Decreased bolus cohesion;Lingual/palatal residue;Weak lingual manipulation;Delayed oral transit Oral - Thin Straw Delayed oral transit;Decreased bolus cohesion;Weak lingual manipulation;Lingual/palatal residue Oral - Puree NT Oral - Mech Soft -- Oral - Regular Delayed oral transit;Decreased bolus cohesion;Weak lingual manipulation;Lingual/palatal residue Oral - Multi-Consistency -- Oral - Pill -- Oral Phase - Comment --  CHL IP PHARYNGEAL PHASE 12/17/2016 Pharyngeal Phase Impaired Pharyngeal- Pudding Teaspoon -- Pharyngeal -- Pharyngeal- Pudding Cup -- Pharyngeal -- Pharyngeal- Honey Teaspoon -- Pharyngeal -- Pharyngeal- Honey Cup Reduced laryngeal elevation;Reduced epiglottic inversion;Pharyngeal residue - valleculae;Pharyngeal residue - posterior pharnyx Pharyngeal -- Pharyngeal- Nectar Teaspoon -- Pharyngeal -- Pharyngeal- Nectar Cup Pharyngeal residue - valleculae;Reduced epiglottic inversion;Reduced laryngeal elevation;Pharyngeal residue - posterior pharnyx;Penetration/Aspiration before swallow Pharyngeal (No Data) Pharyngeal- Nectar Straw -- Pharyngeal -- Pharyngeal- Thin Teaspoon -- Pharyngeal -- Pharyngeal- Thin Cup Delayed swallow initiation-vallecula;Pharyngeal residue - valleculae;Pharyngeal residue - posterior pharnyx;Reduced laryngeal elevation;Reduced epiglottic inversion Pharyngeal -- Pharyngeal- Thin Straw Reduced epiglottic inversion;Reduced laryngeal elevation;Pharyngeal residue - pyriform;Delayed swallow initiation-pyriform sinuses;Pharyngeal residue - posterior pharnyx;Pharyngeal residue -  valleculae;Penetration/Aspiration before swallow Pharyngeal --  Pharyngeal- Puree NT Pharyngeal -- Pharyngeal- Mechanical Soft -- Pharyngeal -- Pharyngeal- Regular Delayed swallow initiation-vallecula;Pharyngeal residue - valleculae;Reduced tongue base retraction Pharyngeal -- Pharyngeal- Multi-consistency -- Pharyngeal -- Pharyngeal- Pill -- Pharyngeal -- Pharyngeal Comment --  CHL IP CERVICAL ESOPHAGEAL PHASE 12/17/2016 Cervical Esophageal Phase WFL Pudding Teaspoon -- Pudding Cup -- Honey Teaspoon -- Honey Cup -- Nectar Teaspoon -- Nectar Cup -- Nectar Straw -- Thin Teaspoon -- Thin Cup -- Thin Straw -- Puree -- Mechanical Soft -- Regular -- Multi-consistency -- Pill -- Cervical Esophageal Comment -- No flowsheet data found. Royce Macadamia 12/17/2016, 2:14 PM  Breck Coons Lonell Face.Ed CCC-SLP Pager [phone]              Scheduled Meds: . carvedilol  6.25 mg Oral BID WC  . chlorhexidine  15 mL Mouth Rinse BID  . ferrous sulfate  325 mg Oral BID WC  . insulin aspart  0-15 Units Subcutaneous TID WC  . mouth rinse  15 mL Mouth Rinse q12n4p  . pantoprazole (PROTONIX) IV  40 mg Intravenous Q12H  . piperacillin-tazobactam (ZOSYN)  IV  3.375 g Intravenous Q8H  . potassium chloride  40 mEq Oral Once  . pravastatin  20 mg Oral Daily  . torsemide  40 mg Oral Daily      Time spent: 25 min  [facility]   [facility] Pager [phone]. If 7PM-7AM, please contact night-coverage at [email or web address], Office  [phone]  password [email or web address] 12/17/2016, 4:21 PM  LOS: 7 days

## 2016-12-17 NOTE — Progress Notes (Signed)
Blood Bank called to inquire if Blood was ready. Blood Bank updated this RN blood ready however not note who they called, Communicated with covering RN and Secretary and no call had been received from Blood Bank

## 2016-12-17 NOTE — Care Management Important Message (Signed)
Important Message  Patient Details  Name: Devin Heath MRN: [id number] Date of Birth: Jun 14, 1929   Medicare Important Message Given:  Yes    [name] [name] 12/17/2016, 11:31 AM

## 2016-12-17 NOTE — Progress Notes (Signed)
Nutrition Brief Note  Chart reviewed due to low Braden score. Patient is now DNR. Family is considering Hospice and comfort care. Palliative Care Team is following. No plans for artificial feeding. Swallow evaluation is being performed this morning. No nutrition interventions warranted at this time.  Please consult as needed.   Joaquin Courts, RD, LDN, CNSC Pager [phone] After Hours Pager [phone]

## 2016-12-17 NOTE — Progress Notes (Signed)
Patient ID: Devin Heath, male   DOB: [birth year], 83 y.o.   MRN: [id number]    SUBJECTIVE: I/Os not recorded due to incontinence but weight is down.  Afebrile, denies dyspnea.  Has pain in legs.  Creatinine up to 2.45 today. CVP 7.  IV diuretics stopped yesterday.   Hemoglobin down to 6.6. No report of overt bleeding.    Echo (1/18): EF 60-65%, mild aortic stenosis, mild to moderate MR, PASP 70 mmHg.   Scheduled Meds: . sodium chloride   Intravenous Once  . carvedilol  6.25 mg Oral BID WC  . chlorhexidine  15 mL Mouth Rinse BID  . diltiazem  360 mg Oral Daily  . ferrous sulfate  325 mg Oral BID WC  . insulin aspart  0-15 Units Subcutaneous TID WC  . mouth rinse  15 mL Mouth Rinse q12n4p  . pantoprazole (PROTONIX) IV  40 mg Intravenous Q12H  . piperacillin-tazobactam (ZOSYN)  IV  3.375 g Intravenous Q8H  . potassium chloride  40 mEq Oral Once  . potassium chloride  40 mEq Oral Once  . pravastatin  20 mg Oral Daily  . torsemide  40 mg Oral Daily   Continuous Infusions:  PRN Meds:.HYDROcodone-acetaminophen, morphine CONCENTRATE, RESOURCE THICKENUP CLEAR, sodium chloride flush    Vitals:   12/17/16 0300 12/17/16 0400 12/17/16 0737 12/17/16 0800  BP:  114/62 (!) 116/54   Pulse:  (!) 103 66 (!) 107  Resp:  20 17 20   Temp: 97.2 F (36.2 C) 97.2 F (36.2 C) 98.9 F (37.2 C)   TempSrc: Axillary  Axillary   SpO2:  95% 98% 100%  Weight:  181 lb 7 oz (82.3 kg)    Height:        Intake/Output Summary (Last 24 hours) at 12/17/16 0811 Last data filed at 12/17/16 0700  Gross per 24 hour  Intake           446.17 ml  Output              900 ml  Net          -453.83 ml    LABS: Basic Metabolic Panel:  Recent Labs  11/29/2016 0432 12/17/16 0500  NA 151* 153*  K 2.9* 3.2*  CL 104 105  CO2 32 31  GLUCOSE 189* 163*  BUN 17 20  CREATININE 2.02* 2.45*  CALCIUM 8.5* 8.4*   Liver Function Tests:  Recent Labs  12/15/16 1720  ALBUMIN 2.2*   No results for input(s):  LIPASE, AMYLASE in the last 72 hours. CBC:  Recent Labs  12/16/16 0432 12/17/16 0500  WBC 8.2 8.2  HGB 8.2* 6.6*  HCT 28.0* 23.1*  MCV 87.8 89.2  PLT 271 311   Cardiac Enzymes: No results for input(s): CKTOTAL, CKMB, CKMBINDEX, TROPONINI in the last 72 hours. BNP: Invalid input(s): POCBNP D-Dimer: No results for input(s): DDIMER in the last 72 hours. Hemoglobin A1C: No results for input(s): HGBA1C in the last 72 hours. Fasting Lipid Panel: No results for input(s): CHOL, HDL, LDLCALC, TRIG, CHOLHDL, LDLDIRECT in the last 72 hours. Thyroid Function Tests: No results for input(s): TSH, T4TOTAL, T3FREE, THYROIDAB in the last 72 hours.  Invalid input(s): FREET3 Anemia Panel: No results for input(s): VITAMINB12, FOLATE, FERRITIN, TIBC, IRON, RETICCTPCT in the last 72 hours.  RADIOLOGY: Dg Cervical Spine 2 Or 3 Views  Result Date: 11/26/2016 CLINICAL DATA:  neck pain EXAM: CERVICAL SPINE - 2-3 VIEW COMPARISON:  None. FINDINGS: Normal alignment of the cervical spine. Ventral syndesmophytes  are noted from C2 through C6. The disc spaces are relatively well preserved. No fracture identified. IMPRESSION: 1. Multi level ventral syndesmophytes noted which may reflect diffuse idiopathic skeletal hyperostosis (DISH) Electronically Signed   By: Signa Kell M.D.   On: 11/26/2016 15:06   Dg Thoracic Spine 2 View  Result Date: 11/26/2016 CLINICAL DATA:  Back pain for 6 weeks. EXAM: THORACIC SPINE 2 VIEWS COMPARISON:  None. FINDINGS: Exaggerated thoracic kyphosis but normal alignment of the vertebral bodies. There are findings suggestive of DISH. No acute fracture or bone lesion. No abnormal paraspinal soft tissue swelling. Tortuosity and calcification of the thoracic aorta is noted. The visualized posterior ribs are intact. IMPRESSION: Moderate to advanced degenerative changes involving the spine with probable changes of DISH. Exaggerated thoracic kyphosis but no acute fracture. Electronically  Signed   By: Rudie Meyer M.D.   On: 11/26/2016 15:13   Ct Head Wo Contrast  Result Date: 12/10/2016 CLINICAL DATA:  Fever and hypotension, slurred speech, suspect sepsis. Assess for CVA. EXAM: CT HEAD WITHOUT CONTRAST TECHNIQUE: Contiguous axial images were obtained from the base of the skull through the vertex without intravenous contrast. COMPARISON:  None. FINDINGS: Brain: There is generalized age related parenchymal atrophy with commensurate dilatation of the ventricles and sulci. Mild chronic small vessel ischemic changes noted within the deep periventricular white matter regions bilaterally. There is no mass, hemorrhage, edema or other evidence of acute parenchymal abnormality. No extra-axial hemorrhage. Vascular: There are chronic calcified atherosclerotic changes of the large vessels at the skull base. No unexpected hyperdense vessel. Skull: Normal. Negative for fracture or focal lesion. Sinuses/Orbits: Extensive chronic appearing mucosal thickening throughout the bilateral maxillary sinuses. Orbital/periorbital soft tissues are unremarkable. Other: None. IMPRESSION: 1. No acute findings.  No intracranial mass, hemorrhage or edema. 2. Chronic appearing paranasal sinus disease. Electronically Signed   By: Bary Richard M.D.   On: 12/10/2016 21:23   Ct Chest Wo Contrast  Result Date: 12/12/2016 CLINICAL DATA:  Sepsis.  Abnormal chest radiograph. EXAM: CT CHEST WITHOUT CONTRAST TECHNIQUE: Multidetector CT imaging of the chest was performed following the standard protocol without IV contrast. COMPARISON:  Chest radiograph from 12/10/2016 FINDINGS: Cardiovascular: The heart size is mildly enlarged. There is aortic atherosclerosis identified. Previous median sternotomy and CABG procedure. Mediastinum/Nodes: The trachea appears patent and is midline. Normal appearance of the esophagus. Calcified mediastinal and hilar lymph nodes are identified compatible with prior granulomatous disease. Noncalcified left  paratracheal lymph node measures 1.2 cm, image 50 of series 201. Noncalcified right paratracheal lymph node measures 1.2 cm, image 52 of series 201. Lungs/Pleura: Diffuse bronchial wall thickening. There are moderate bilateral pleural effusions. There is mild overlying compressive type atelectasis. Interlobular septal thickening within the lung bases compatible with mild edema. A few clustered non-specific pulmonary nodules noted in the left lower lobe noted measure up to 7 mm, image 67 series 205. Upper Abdomen: No acute abnormality. Musculoskeletal: No chest wall mass or suspicious bone lesions identified. IMPRESSION: 1. No evidence for pneumonia. 2. Mild cardiac enlargement, aortic atherosclerosis, mild edema and bilateral pleural effusions. Suspected congestive heart failure. 3. Prior granulomatous disease. 4. Small nonspecific nodules clustered in the left lower lobe. Non-contrast chest CT at 3-6 months is recommended. If the nodules are stable at time of repeat CT, then future CT at 18-24 months (from today's scan) is considered optional for low-risk patients, but is recommended for high-risk patients. This recommendation follows the consensus statement: Guidelines for Management of Incidental Pulmonary Nodules Detected on CT  Images:From the Fleischner Society 2017; published online before print (10.1148/[id number]). Electronically Signed   By: Signa Kell M.D.   On: 12/12/2016 09:39   Dg Chest Portable 1 View  Result Date: 12/10/2016 CLINICAL DATA:  Fever EXAM: PORTABLE CHEST 1 VIEW COMPARISON:  11/25/2016 FINDINGS: Median sternotomy wires are again evident. A right upper extremity catheter tip is difficult to visualize, catheter tubing is seen to the level of SVC. Mild atelectasis or infiltrate at the right lung base. No effusion. Stable cardiomediastinal silhouette. No pneumothorax. IMPRESSION: Mild hazy atelectasis or infiltrate at the right infrahilar lung. Stable borderline to mild  cardiomegaly without overt failure. Electronically Signed   By: Jasmine Pang M.D.   On: 12/10/2016 23:18   Dg Chest Port 1 View  Result Date: 11/25/2016 CLINICAL DATA:  Leukocytosis. EXAM: PORTABLE CHEST 1 VIEW COMPARISON:  11/24/2016 FINDINGS: Prior CABG. Heart is borderline in size. Diffuse interstitial prominence throughout the lungs with peribronchial thickening. Mild hyperinflation. No confluent opacity or effusion. No acute bony abnormality. IMPRESSION: Hyperinflation/ COPD.  Chronic bronchitic changes. Electronically Signed   By: Charlett Nose M.D.   On: 11/25/2016 09:41   Dg Chest Port 1 View  Result Date: 11/24/2016 CLINICAL DATA:  Initial evaluation for acute shortness of breath. EXAM: PORTABLE CHEST 1 VIEW COMPARISON:  Prior radiograph from 11/21/2016. FINDINGS: Median sternotomy wires with underlying CABG markers and surgical clips, stable. Mild cardiomegaly is unchanged. Mediastinal silhouette within normal limits. Aortic atherosclerosis noted. Lungs normally inflated. No focal infiltrates. No pulmonary edema or pleural effusion. No pneumothorax. No acute osseous abnormality. IMPRESSION: 1. No active cardiopulmonary disease. 2. Mild cardiomegaly with sequelae of prior CABG. Electronically Signed   By: Rise Mu M.D.   On: 11/24/2016 22:00   Dg Chest Portable 1 View  Result Date: 11/21/2016 CLINICAL DATA:  Pt with weight gain this AM of 2 lbs, pt has increased SOB. Hx CHF, COPD, CAD, afib EXAM: PORTABLE CHEST 1 VIEW COMPARISON:  10/13/2016 FINDINGS: Status post median sternotomy and CABG. Heart size is normal. Lungs are clear. No pulmonary edema. Degenerative changes are seen in thoracic spine. IMPRESSION: No evidence for acute  abnormality. Electronically Signed   By: Norva Pavlov M.D.   On: 11/21/2016 17:03    PHYSICAL EXAM General: NAD Neck: JVP 7 cm, no thyromegaly or thyroid nodule.  Lungs: Clear to auscultation bilaterally with normal respiratory effort. CV:  Nondisplaced PMI.  Heart mildly tachy, irregular S1/S2, no S3/S4, 1/6 SEM RUSB.  No edema.   Abdomen: Soft, nontender, no hepatosplenomegaly, no distention.  Neurologic: Alert, oriented to person/place.  Psych: Normal affect. Extremities: No clubbing or cyanosis.   TELEMETRY: Reviewed telemetry pt in atrial fibrillation rate 100s-110s  ASSESSMENT AND PLAN: 83 yo with history of CAD s/p CABG, CKD stage IV, chronic atrial fibrillation, and chronic diastolic CHF presented with PNA along with acute/chronic diastolic CHF.  1. Atrial fibrillation: Mild RVR on diltiazem gtt currently.  He is not on anticoagulation due to GI bleeding in 12/17.  - Stop diltiazem gtt, start diltiazem CD 360 mg daily.   - Continue Coreg.  2. Acute on chronic diastolic CHF: Weight down with diuresis and creatinine up.  CVP 7.  Will continue torsemide today despite rise in creatinine as he will get a unit of blood.  3. CKD: Stage IV.  Creatinine higher with diuresis, off IV Lasix now.  4. ID: Suspect PNA versus acute bronchitis.  Febrile at admission.  Blood cultures with only 1/2 coag negative Staph, suspect contaminant.  -  Currently on Zosyn per primary service.  5. CAD: s/p CABG.  Not on ASA with recent bleeding and current anemia.  Can continue home statin.  6. Anemia: Had recent GI bleed in 12/17 and Eliquis stopped. Hemoglobin down to 6.6 without overt bleeding at this point.  Will transfuse 1 unit PRBCs today.  7. OSA: CPAP at night.  8. Hypernatremia: Na still elevated.  Encourage po intake.  9. Family considering hospice, he is DNR.    Marca Ancona 12/17/2016 8:11 AM

## 2016-12-17 NOTE — Progress Notes (Addendum)
Pt unable to swallow morning meds, pt coughing and vomiting up pill contents, family at [facility] and concerned of results of Swallow eval, there are no current results on Swallow eval, pt's daughter is RN from out of state and appears to be concerned of plan, pt's wife also at [facility] and happy with care at this time and states we all need to sit down and talk about plan of care

## 2016-12-17 NOTE — Progress Notes (Signed)
Called NP for Palliative and updated on concerns from pt's family, Nursing will cont to monitor

## 2016-12-17 NOTE — Progress Notes (Signed)
CRITICAL VALUE ALERT  Critical value received: HGB 6.6  Date of notification: 1/25   Time of notification:0730  Critical value read back:Yes.    Nurse who received alert:  Rod Mae  MD notified (1st page):  texted paged [place]  Time of first page: 0733  MD notified (2nd page):  Time of second page:  Responding MD:   Time MD responded:

## 2016-12-17 NOTE — Progress Notes (Addendum)
Modified Barium Swallow Progress Note  Patient Details  Name: Uno Esau MRN: [id number] Date of Birth: 1929-11-12  Today's Date: 12/17/2016  Modified Barium Swallow completed.  Full report located under Chart Review in the Imaging Section.  Brief recommendations include the following:  Clinical Impression  Pt exhibits mild oral and moderate pharyngeal dysphagia. Oral deficits included decreased oral cohesion resulting in sublingual spilling and prolonged oral transit time. Pt presented with a suspected structual based pharyngeal dysphagia due to presence of bony structure (osteophyte? not confirmed by radiologist) at the level of cervical vertebrae preventing epiglottic inversion resulting in silent penetration to the level of the vocal cords with thin liquids via straw. Pt may have experienced premorbid swallowing difficulties due to cervical protrusion and compensated for however now unable due to present illness and decreased functional reserve. Laryngeal elevation mildly decreased. He exhibited moderate vallecular/pyriform sinuses residue and was unable to perform second volitional swallow and mildly delayed swallow initiation. Pt is at higher aspiration risk with all consistencies. If family agreeable to comfort feeds, SLP recommends Dys 1 diet, thin liquids (due to reduced residual) no straws, meds crushed in applesauce. If pt begins to cough frequently and/or harshly with thin then liquids can be changed back to nectar thick. Will follow for education needs with family.    Swallow Evaluation Recommendations       SLP Diet Recommendations: Thin liquid;Dysphagia 1 (Puree) solids (if pt choses comfort feeds)   Liquid Administration via: Cup;No straw   Medication Administration: Crushed with puree   Supervision: Patient able to self feed;Full supervision/cueing for compensatory strategies;Staff to assist with self feeding   Compensations: Slow rate;Small sips/bites   Postural  Changes: Seated upright at 90 degrees   Oral Care Recommendations: Oral care BID        Royce Macadamia 12/17/2016,2:15 PM   Breck Coons [place].Ed [employer] [phone]

## 2016-12-17 NOTE — Progress Notes (Signed)
Daily Progress Note   Patient Name: Devin Heath       Date: 12/17/2016 DOB: 01-Sep-1929  Age: 83 y.o. MRN#: [id number] Attending Physician: Meredeth Ide, MD Primary Care Physician: [name], MD Admit Date: 12/10/2016  Reason for Consultation/Follow-up: Establishing goals of care, Non pain symptom management and Terminal Care  Subjective: Patient somnolent this morning, but arouses. Says he is hungry. Complains of pain in his legs, in his head, in his arms. Is not oriented to place, time, or situation. Family continues to discuss hospice and comfort care. Awaiting arrival of family member from out of town.   Review of Systems  Unable to perform ROS: Mental status change    Length of Stay: 7  Current Medications: Scheduled Meds:  . sodium chloride   Intravenous Once  . carvedilol  6.25 mg Oral BID WC  . chlorhexidine  15 mL Mouth Rinse BID  . diltiazem  360 mg Oral Daily  . ferrous sulfate  325 mg Oral BID WC  . insulin aspart  0-15 Units Subcutaneous TID WC  . mouth rinse  15 mL Mouth Rinse q12n4p  . pantoprazole (PROTONIX) IV  40 mg Intravenous Q12H  . piperacillin-tazobactam (ZOSYN)  IV  3.375 g Intravenous Q8H  . potassium chloride  40 mEq Oral Once  . potassium chloride  40 mEq Oral Once  . pravastatin  20 mg Oral Daily  . torsemide  40 mg Oral Daily    Continuous Infusions:   PRN Meds: HYDROcodone-acetaminophen, morphine CONCENTRATE, RESOURCE THICKENUP CLEAR, sodium chloride flush  Physical Exam  HENT:  Dry oral mucosa, dentures not affixed  Cardiovascular:  Tachycardic   Pulmonary/Chest: Effort normal and breath sounds normal. No respiratory distress.  Abdominal: Soft. Bowel sounds are normal.  Musculoskeletal: Normal range of motion.    Neurological:  Somnolent, not oriented  Skin: Skin is warm and dry.  Skin dry and flaking  Psychiatric:  Flat affect            Vital Signs: BP (!) 116/54 (BP Location: Left Arm)   Pulse (!) 107   Temp 98.9 F (37.2 C) (Axillary)   Resp 20   Ht 5\' 7"  (1.702 m)   Wt 82.3 kg (181 lb 7 oz)   SpO2 100%   BMI 28.42 kg/m  SpO2: SpO2: 100 %  O2 Device: O2 Device: CPAP O2 Flow Rate: O2 Flow Rate (L/min): 2 L/min  Intake/output summary:  Intake/Output Summary (Last 24 hours) at 12/17/16 0954 Last data filed at 12/17/16 0700  Gross per 24 hour  Intake           396.17 ml  Output              900 ml  Net          -503.83 ml   LBM: Last BM Date: 12/15/16 Baseline Weight: Weight: 88.5 kg (195 lb) Most recent weight: Weight: 82.3 kg (181 lb 7 oz)       Palliative Assessment/Data: PPS: 20%    Flowsheet Rows   Flowsheet Row Most Recent Value  Intake Tab  Referral Department  Hospitalist  Unit at Time of Referral  Intermediate Care Unit  Palliative Care Primary Diagnosis  Cardiac  Date Notified  12/14/16  Palliative Care Type  Return patient Palliative Care  Reason for referral  Clarify Goals of Care  Date of Admission  12/10/16  Date first seen by Palliative Care  12/16/16  # of days Palliative referral response time  2 Day(s)  # of days IP prior to Palliative referral  4  Clinical Assessment  Psychosocial & Spiritual Assessment  Palliative Care Outcomes      Patient Active Problem List   Diagnosis Date Noted  . Pressure injury of skin 12/11/2016  . HCAP (healthcare-associated pneumonia) 12/10/2016  . Shingles ([place]) 11/28/2016  . CAD in native artery   . Upper GI bleed   . Neck pain   . Ischemic dilated cardiomyopathy (HCC)   . DISH (diffuse idiopathic skeletal hyperostosis)   . Atrial fibrillation with RVR (HCC)   . Chronic diastolic CHF (congestive heart failure) (HCC)   . Acute renal failure superimposed on stage 4 chronic kidney disease (HCC)   .  Uncontrolled type 2 diabetes mellitus with complication (HCC)   . Type 2 diabetes mellitus with ketoacidotic coma, without long-term current use of insulin   . Chronic anticoagulation   . Lactic acidosis 11/21/2016  . GIB (gastrointestinal bleeding) 11/21/2016  . Melena   . Acute blood loss anemia   . Coronary artery disease involving coronary bypass graft of native heart without angina pectoris   . Hypokalemia 10/13/2016  . Chest pain with high risk for cardiac etiology 10/13/2016  . Abnormal EKG 10/13/2016  . Hyperglycemia 10/13/2016  . Leukocytosis 10/13/2016  . Acute on chronic renal insufficiency   . OSA (obstructive sleep apnea)   . Ischemic cardiomyopathy   . IBS (irritable bowel syndrome)   . Hyperlipidemia   . Heart failure (HCC)   . Diabetes (HCC)   . Chronic obstructive pulmonary disease (HCC)   . CAD (coronary artery disease)   . Rheumatoid arthritis (HCC)   . Atrial fibrillation with rapid ventricular response (HCC)   . OSA on CPAP   . RLS (restless legs syndrome)   . Vitamin D deficiency   . Gout   . Hard of hearing   . Former smoker     Palliative Care Assessment & Plan   Patient Profile: 83 y.o. male  with past medical history of A fib, osteoarthrtis, CAD, CHF (diastolic dysfunction), COPD, DMII, gout, hyperlipidemia, IBS, ostructive sleep apnea on CPAP, CKD stage IV admitted on 12/10/2016 with altered mental status and fever. He was recently discharged to SNF for rehab after a hospital admission for GI bleed d/t anticoag for Afib. Workup reveals suspected  HCAP with sepsis, CT shows bilateral pleural effusions.     Assessment/Recommendations/Plan   Hgb down to 6.6 this a.m. - no overt signs of bleeding  Frequent pain assessment - utilize sublingual morphine as previously ordered  Delirium precautions  Continued GOC with family once patient's daughter arrives from out of town  Goals of Care and Additional Recommendations:  Limitations on Scope of  Treatment: No Artificial Feeding and No Surgical Procedures  Code Status:  DNR  Prognosis:   Unable to determine anticipate less than 2 weeks if patient continues to decline, not taking in meaningful po  Discharge Planning:  To Be Determined  Care plan was discussed with patient's RN- Mardene Celeste.   Thank you for allowing the Palliative Medicine Team to assist in the care of this patient.   Total time: 15 minutes  Greater than 50%  of this time was spent counseling and coordinating care related to the above assessment and plan.  Ocie Bob, AGNP-C Palliative Medicine   Please contact Palliative Medicine Team phone at [phone] for questions and concerns.

## 2016-12-17 NOTE — Progress Notes (Signed)
Responded to consult for retired Devin Heath, could be nearing end of life. Provided spiritual/ emotional support and prayer. Fyi when family member in rm asked pt did he want a prayer, it was clear she did and he did not give a verbal assent. So suggested saying the Lord's Prayer together, during which she held pt's hand and prayed w/ me but he did not. Advised her that chaplain was available for f/u when desired.   12/17/16 1800  Clinical Encounter Type  Visited With Patient and family together  Visit Type Initial;Psychological support;Spiritual support  Referral From Nurse  Spiritual Encounters  Spiritual Needs Prayer;Emotional  Stress Factors  Patient Stress Factors Health changes;Loss of control  Family Stress Factors Family relationships;Health changes;Loss of control   Ephraim Hamburger, [address]

## 2016-12-17 NOTE — Progress Notes (Signed)
No charge note:   Received call from [place] re: patient update. Family members at bedside with multiple questions regarding swallow function and diet.   I spoke with Lucille Passy and gave results of swallow eval- showing dysphagia with high risk for aspiration.   Lucille Passy states she "thinks they have all the information they need" and their plan is to convene for family meeting tonight to discuss patient's path of care. Lucille Passy requested call from social work for information regarding disposition benefits.  Social work consult placed.  Ocie Bob, AGNP-C Palliative Medicine   Please call Palliative Medicine team phone with any questions [phone]. For individual providers please see AMION.

## 2016-12-18 DIAGNOSIS — Z7189 Other specified counseling: Secondary | ICD-10-CM

## 2016-12-18 DIAGNOSIS — Z515 Encounter for palliative care: Secondary | ICD-10-CM

## 2016-12-18 LAB — TYPE AND SCREEN
ABO/RH(D): O POS
Antibody Screen: NEGATIVE
UNIT DIVISION: 0

## 2016-12-18 LAB — BASIC METABOLIC PANEL
Anion gap: 12 (ref 5–15)
BUN: 22 mg/dL — AB (ref 6–20)
CALCIUM: 8.3 mg/dL — AB (ref 8.9–10.3)
CHLORIDE: 113 mmol/L — AB (ref 101–111)
CO2: 31 mmol/L (ref 22–32)
CREATININE: 2.41 mg/dL — AB (ref 0.61–1.24)
GFR calc Af Amer: 27 mL/min — ABNORMAL LOW (ref >60)
GFR calc non Af Amer: 23 mL/min — ABNORMAL LOW (ref >60)
Glucose, Bld: 152 mg/dL — ABNORMAL HIGH (ref 65–99)
Potassium: 3.4 mmol/L — ABNORMAL LOW (ref 3.5–5.1)
SODIUM: 156 mmol/L — AB (ref 135–145)

## 2016-12-18 LAB — CBC
HCT: 30.6 % — ABNORMAL LOW (ref 39.0–52.0)
Hemoglobin: 8.9 g/dL — ABNORMAL LOW (ref 13.0–17.0)
MCH: 25.8 pg — AB (ref 26.0–34.0)
MCHC: 29.1 g/dL — ABNORMAL LOW (ref 30.0–36.0)
MCV: 88.7 fL (ref 78.0–100.0)
PLATELETS: 275 10*3/uL (ref 150–400)
RBC: 3.45 MIL/uL — ABNORMAL LOW (ref 4.22–5.81)
RDW: 15.7 % — AB (ref 11.5–15.5)
WBC: 6.9 10*3/uL (ref 4.0–10.5)

## 2016-12-18 LAB — GLUCOSE, CAPILLARY
Glucose-Capillary: 148 mg/dL — ABNORMAL HIGH (ref 65–99)
Glucose-Capillary: 147 mg/dL — ABNORMAL HIGH (ref 65–99)

## 2016-12-18 MED ORDER — BIOTENE DRY MOUTH MT LIQD: 15.0000 mL | OROMUCOSAL | Status: DC | PRN

## 2016-12-18 MED ORDER — SENNA 8.6 MG PO TABS: 1.0000 | ORAL_TABLET | Freq: Every evening | ORAL | Status: DC | PRN

## 2016-12-18 MED ORDER — ACETAMINOPHEN 650 MG RE SUPP
650.0000 mg | RECTAL | Status: DC | PRN
  Administered 2016-12-18: 650 mg via RECTAL

## 2016-12-18 MED ORDER — LORAZEPAM 1 MG PO TABS: 1.0000 mg | ORAL_TABLET | ORAL | Status: DC | PRN

## 2016-12-18 MED ORDER — LORAZEPAM 2 MG/ML PO CONC: 1.0000 mg | ORAL | Status: DC | PRN

## 2016-12-18 MED ORDER — GLYCOPYRROLATE 0.2 MG/ML IJ SOLN: 0.2000 mg | INTRAMUSCULAR | Status: DC | PRN

## 2016-12-18 MED ORDER — POLYVINYL ALCOHOL 1.4 % OP SOLN: 1.0000 [drp] | Freq: Four times a day (QID) | OPHTHALMIC | Status: DC | PRN

## 2016-12-18 MED ORDER — HALOPERIDOL LACTATE 5 MG/ML IJ SOLN: 0.5000 mg | INTRAMUSCULAR | Status: DC | PRN

## 2016-12-18 MED ORDER — MORPHINE SULFATE (CONCENTRATE) 10 MG/0.5ML PO SOLN
5.0000 mg | ORAL | Status: AC
  Administered 2016-12-18: 5 mg via SUBLINGUAL
  Filled 2016-12-18: qty 0.5

## 2016-12-18 MED ORDER — POTASSIUM CHLORIDE 20 MEQ PO PACK
40.0000 meq | PACK | Freq: Once | ORAL | Status: DC
  Filled 2016-12-18: qty 2

## 2016-12-18 MED ORDER — DILTIAZEM HCL ER COATED BEADS 180 MG PO CP24
360.0000 mg | ORAL_CAPSULE | Freq: Every day | ORAL | Status: DC
  Filled 2016-12-18: qty 2

## 2016-12-18 MED ORDER — HALOPERIDOL LACTATE 2 MG/ML PO CONC: 0.5000 mg | ORAL | Status: DC | PRN

## 2016-12-18 MED ORDER — DIPHENHYDRAMINE HCL 50 MG/ML IJ SOLN
12.5000 mg | INTRAMUSCULAR | Status: DC | PRN
  Administered 2016-12-19: 12.5 mg via INTRAVENOUS
  Filled 2016-12-18: qty 1

## 2016-12-18 MED ORDER — ONDANSETRON HCL 4 MG/2ML IJ SOLN: 4.0000 mg | Freq: Four times a day (QID) | INTRAMUSCULAR | Status: DC | PRN

## 2016-12-18 MED ORDER — DILTIAZEM HCL 60 MG PO TABS
90.0000 mg | ORAL_TABLET | Freq: Four times a day (QID) | ORAL | Status: DC
  Administered 2016-12-18: 12:00:00 90 mg via ORAL
  Filled 2016-12-18: qty 1

## 2016-12-18 MED ORDER — HALOPERIDOL 0.5 MG PO TABS: 0.5000 mg | ORAL_TABLET | ORAL | Status: DC | PRN

## 2016-12-18 MED ORDER — LORAZEPAM 2 MG/ML IJ SOLN: 1.0000 mg | INTRAMUSCULAR | Status: DC | PRN

## 2016-12-18 MED ORDER — MORPHINE SULFATE (CONCENTRATE) 10 MG/0.5ML PO SOLN
5.0000 mg | ORAL | Status: DC
  Administered 2016-12-18 – 2016-12-19 (×4): 5 mg via ORAL
  Filled 2016-12-18 (×5): qty 0.5

## 2016-12-18 MED ORDER — GLYCOPYRROLATE 1 MG PO TABS: 1.0000 mg | ORAL_TABLET | ORAL | Status: DC | PRN

## 2016-12-18 MED ORDER — TRAZODONE HCL 50 MG PO TABS: 25.0000 mg | ORAL_TABLET | Freq: Every evening | ORAL | Status: DC | PRN

## 2016-12-18 MED ORDER — MORPHINE SULFATE (CONCENTRATE) 10 MG/0.5ML PO SOLN
5.0000 mg | ORAL | Status: DC | PRN
  Administered 2016-12-18 – 2016-12-19 (×6): 5 mg via SUBLINGUAL
  Filled 2016-12-18 (×6): qty 0.5

## 2016-12-18 MED ORDER — ONDANSETRON 4 MG PO TBDP: 4.0000 mg | ORAL_TABLET | Freq: Four times a day (QID) | ORAL | Status: DC | PRN

## 2016-12-18 NOTE — Progress Notes (Addendum)
Daily Progress Note   Patient Name: Devin Heath       Date: 12/18/2016 DOB: 1929/09/19  Age: 83 y.o. MRN#: [id number] Attending Physician: Oswald Hillock, MD Primary Care Physician: Henrine Screws, MD Admit Date: 12/10/2016  Reason for Consultation/Follow-up: Establishing goals of care, Non pain symptom management and Terminal Care  Subjective: Patient awake but not oriented. Tells me he is in a hotel. Hurts "all over".  I spoke with Joanne Chars and Romie Minus this morning, they have opted to change focus of care to comfort. They request to meet with social worker this afternoon to discuss placement options. Patient was admitted from [facility] and they would like to explore the option of him returning there with Hospice vs other placement options with Hospice. They are concerned about allowing him to eat and drink. We discussed that comfort care would allow for comfort feeding and drinking with aspiration precautions in place. They accept that he is a severe risk of aspiration and pneumonia and accept that risk and would like to proceed with comfort feeding.    Met again in the afternoon with patient's family and Judson Roch, [place]. Plan made to refer to residential Hospice for symptom management and comfort care.   Review of Systems  Unable to perform ROS: Mental status change    Length of Stay: 8  Current Medications: Scheduled Meds:  . carvedilol  6.25 mg Oral BID WC  . chlorhexidine  15 mL Mouth Rinse BID  . diltiazem  90 mg Oral Q6H  . ferrous sulfate  325 mg Oral BID WC  . insulin aspart  0-15 Units Subcutaneous TID WC  . mouth rinse  15 mL Mouth Rinse q12n4p  . pantoprazole (PROTONIX) IV  40 mg Intravenous Q12H  . potassium chloride  40 mEq Oral Once  . pravastatin  20 mg Oral Daily  .  torsemide  40 mg Oral Daily    Continuous Infusions:   PRN Meds: acetaminophen, HYDROcodone-acetaminophen, morphine CONCENTRATE, RESOURCE THICKENUP CLEAR, sodium chloride flush  Physical Exam  HENT:  Dry oral mucosa  Cardiovascular:  Tachycardic   Pulmonary/Chest: Effort normal and breath sounds normal. No respiratory distress.  Abdominal: Soft. Bowel sounds are normal.  Musculoskeletal: Normal range of motion.  Generalized weakness  Neurological:  Awake, disoriented  Skin: Skin is warm and dry.  Skin dry  and flaking  Psychiatric:  Flat affect            Vital Signs: BP (!) 120/94 (BP Location: Left Arm)   Pulse 100   Temp 97.5 F (36.4 C) (Oral)   Resp 20   Ht 5' 7"  (1.702 m)   Wt 83.8 kg (184 lb 12.8 oz)   SpO2 90%   BMI 28.94 kg/m  SpO2: SpO2: 90 % O2 Device: O2 Device: Not Delivered O2 Flow Rate: O2 Flow Rate (L/min): 1 L/min  Intake/output summary:   Intake/Output Summary (Last 24 hours) at 12/18/16 1116 Last data filed at 12/18/16 0600  Gross per 24 hour  Intake           863.84 ml  Output              425 ml  Net           438.84 ml   LBM: Last BM Date: 12/15/16 Baseline Weight: Weight: 88.5 kg (195 lb) Most recent weight: Weight: 83.8 kg (184 lb 12.8 oz)       Palliative Assessment/Data: PPS: 20%    Flowsheet Rows   Flowsheet Row Most Recent Value  Intake Tab  Referral Department  Hospitalist  Unit at Time of Referral  Intermediate Care Unit  Palliative Care Primary Diagnosis  Cardiac  Date Notified  12/14/16  Palliative Care Type  Return patient Palliative Care  Reason for referral  Clarify Goals of Care  Date of Admission  12/10/16  Date first seen by Palliative Care  12/16/16  # of days Palliative referral response time  2 Day(s)  # of days IP prior to Palliative referral  4  Clinical Assessment  Psychosocial & Spiritual Assessment  Palliative Care Outcomes      Patient Active Problem List   Diagnosis Date Noted  . Goals of  care, counseling/discussion   . Advance care planning   . Palliative care by specialist   . Pain, generalized   . Pressure injury of skin 12/11/2016  . HCAP (healthcare-associated pneumonia) 12/10/2016  . Shingles ([place]) 11/28/2016  . CAD in native artery   . Upper GI bleed   . Neck pain   . Ischemic dilated cardiomyopathy ([place])   . DISH (diffuse idiopathic skeletal hyperostosis)   . Atrial fibrillation with RVR ([place])   . Chronic diastolic CHF (congestive heart failure) ([place])   . Acute renal failure superimposed on stage 4 chronic kidney disease ([place])   . Uncontrolled type 2 diabetes mellitus with complication ([place])   . Type 2 diabetes mellitus with ketoacidotic coma, without long-term current use of insulin   . Chronic anticoagulation   . Lactic acidosis 11/21/2016  . GIB (gastrointestinal bleeding) 11/21/2016  . Melena   . Acute blood loss anemia   . Coronary artery disease involving coronary bypass graft of native heart without angina pectoris   . Hypokalemia 10/13/2016  . Chest pain with high risk for cardiac etiology 10/13/2016  . Abnormal EKG 10/13/2016  . Hyperglycemia 10/13/2016  . Leukocytosis 10/13/2016  . Acute on chronic renal insufficiency   . OSA (obstructive sleep apnea)   . Ischemic cardiomyopathy   . IBS (irritable bowel syndrome)   . Hyperlipidemia   . Heart failure ([place])   . Diabetes ([place])   . Chronic obstructive pulmonary disease ([place])   . CAD (coronary artery disease)   . Rheumatoid arthritis ([place])   . Atrial fibrillation with rapid ventricular response ([place])   . OSA on CPAP   .  RLS (restless legs syndrome)   . Vitamin D deficiency   . Gout   . Hard of hearing   . Former smoker     Palliative Care Assessment & Plan   Patient Profile: 83 y.o. male  with past medical history of A fib, osteoarthrtis, CAD, CHF (diastolic dysfunction), COPD, DMII, gout, hyperlipidemia, IBS, ostructive sleep apnea on CPAP, CKD stage IV admitted on 12/10/2016 with  altered mental status and fever. He was recently discharged to SNF for rehab after a hospital admission for GI bleed d/t anticoag for Afib. Workup reveals suspected HCAP with sepsis, CT shows bilateral pleural effusions. Clinical picture is poor with severe aspiration risk on swallow eval, continued altered mental status, decreased Hgb with unknown origin and no further workup planned. He's having ongoing generalized pain that is requiring prn medications and frequent titrations and dose adjustments. With transition to comfort care pulmonary edema will likely increase requiring symptom management for SOB. Family has chosen comfort care and Hospice.    Assessment/Recommendations/Plan   Transition to comfort care  Begin comfort feedings  Aspiration precautions  Meeting with social work this afternoon to discuss disposition  Frequent pain assessment - utilize sublingual morphine as previously ordered  Delirium precautions  Referral to residential hospice facility for symptom management at end of life (SOB, pain, delirium)   Goals of Care and Additional Recommendations:  Limitations on Scope of Treatment: No Artificial Feeding and No Surgical Procedures  Code Status:  DNR  Prognosis:   < 2 weeks anticipate less than 2 weeks, not taking in meaningful po, transition to comfort care in setting of pna, sepsis, high risk for aspiration with comfort feeding, plueral effusions, pulmonary edema requiring aggressive symptom management  Discharge Planning:  Hospice facility  Care plan was discussed with Annabell Howells, [place] Scrape, SW.   Thank you for allowing the Palliative Medicine Team to assist in the care of this patient.   Total time: 60 minutes  Greater than 50%  of this time was spent counseling and coordinating care related to the above assessment and plan.  Mariana Kaufman, AGNP-C Palliative Medicine   Please contact Palliative Medicine Team phone at [phone] for questions  and concerns.

## 2016-12-18 NOTE — Progress Notes (Signed)
[facility]  PROGRESS NOTE  Devin Heath ONG:[id number] DOB: 06-12-1929 DOA: 12/10/2016 PCP: [name], MD   Brief HPI:   83 y.o.malewith medical history significant of A. fib, osteoarthritis, CAD, diastolic dysfunction, COPD, type 2 diabetes, gout, hyperlipidemia, IBS, obstructive sleep apnea on CPAP, renal insufficiency, rheumatoid arthritis, restless leg syndrome who was recently discharged from the hospital due to GI bleed secondary to anticoagulation for atrial fibrillation, now brought from nursing home facility for evaluation of altered mental status and fever for the past 2 days. The patient was receiving ceftriaxone through [facility] at the [facility]    Subjective   Patient Seen and examined, denies pain at this time.   Assessment/Plan:     1. Sepsis- due to healthcare associated pneumonia, Patient was started on Zosyn, vancomycin Repeat CT chest showed bilateral pleural effusions. Lactic acid is now clear.Blood cultures 1 out of 2 bottles growing coagulase-negative staph. Zosyn has been discontinued as patient is now comfort care only. 2. Acute hypoxic respiratory failure- multifactorial from pneumonia as well as acute diastolic CHF. Patient was started on on Lasix 80 mg IV twice a day per cardiology. Echo  showed stable EF and difficult to assess vascular dysfunction. Lasix was transitioned  to torsemide 40 mg daily. Torsemide has been discontinued as patient is comfort care only. 3. CAD status post CABG- patient on aspirin, continue statins. 4. Hypernatremia- still has elevated sodium 156. No further labs 5. Hypokalemia-potassium is 3.4. 6. Atrial fibrillation- CHA2DS2VASc score is 4. Started on Cardizem drip, metoprolol. Not anti-coagulated due to significant GI bleed. 7. Acute on chronic kidney stage III-today creatinine 2.02 due to high-dose diuresis.  8. Obstructive sleep apnea-continue CPAP 9. 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 Blood glucose is stable. 10. Anemia of chronic disease- patient had recent GI bleed, hemoglobin is 6.6 today. One unit PRBC transfuse 11. Dysphagia- MBS show high risk of aspiration with all consistencies, speech therapy recommends Dys 1 diet as  family agrees to comfort care.     DVT prophylaxis: SCDs  Code Status: DO NOT RESUSCITATE  Family Communication:  Discussed with patients wife at bedside  Disposition Plan:  Residential hospice   Consultants:  Cardiology  Procedures:  None  Continuous infusions     Antibiotics:   Anti-infectives    Start     Dose/Rate Route Frequency Ordered Stop   12/11/16 1600  vancomycin (VANCOCIN) IVPB 1000 mg/200 mL premix  Status:  Discontinued     1,000 mg 200 mL/hr over 60 Minutes Intravenous Every 24 hours 12/11/16 1445 12/13/16 1738   12/11/16 1500  vancomycin (VANCOCIN) 1,250 mg in sodium chloride 0.9 % 250 mL IVPB  Status:  Discontinued     1,250 mg 166.7 mL/hr over 90 Minutes Intravenous Every 24 hours 12/10/16 1953 12/11/16 1445   12/11/16 0200  piperacillin-tazobactam (ZOSYN) IVPB 3.375 g     3.375 g 12.5 mL/hr over 240 Minutes Intravenous Every 8 hours 12/10/16 1953 12/17/16 2217   12/10/16 1945  piperacillin-tazobactam (ZOSYN) IVPB 3.375 g     3.375 g 100 mL/hr over 30 Minutes Intravenous  Once 12/10/16 1942 12/10/16 2100   12/10/16 1945  vancomycin (VANCOCIN) IVPB 1000 mg/200 mL premix     1,000 mg 200 mL/hr over 60 Minutes Intravenous  Once 12/10/16 1942 12/10/16 2141       Objective   Vitals:   12/18/16 0600 12/18/16 0700 12/18/16 0749 12/18/16 1140  BP: 107/67 (!) 125/54 (!) 120/94 123/63  Pulse: (!) 113 (!) 109 100 (!) 115  Resp: 17 (!) 25 20 17   Temp:   97.5 F (36.4 C) 97.5 F (36.4 C)  TempSrc:   Oral Oral  SpO2: 96% (!) 88% 90% 90%  Weight:      Height:        Intake/Output Summary (Last 24 hours) at 12/18/16 1619 Last data filed at 12/18/16 1100  Gross per  24 hour  Intake           863.84 ml  Output              675 ml  Net           188.84 ml   Filed Weights   12/16/16 0446 12/17/16 0400 12/18/16 0300  Weight: 84.5 kg (186 lb 4.6 oz) 82.3 kg (181 lb 7 oz) 83.8 kg (184 lb 12.8 oz)     Physical Examination:  General exam: Appears calm and comfortable. Respiratory system: Clear to auscultation. Respiratory effort normal. Cardiovascular system:  RRR. No  murmurs, rubs, gallops. Trace edema lower extremities bilaterally GI system: Abdomen is nondistended, soft and nontender. No organomegaly.  Central nervous system. No focal neurological deficits. 5 x 5 power in all extremities. Skin: No rashes, lesions or ulcers. Psychiatry: Alert, oriented x 3.Judgement and insight appear normal. Affect withdrawn.    Data Reviewed: I have personally reviewed following labs and imaging studies  CBG:  Recent Labs Lab 12/17/16 1307 12/17/16 1640 12/17/16 2122 12/18/16 0752 12/18/16 1133  GLUCAP 168* 145* 115* 148* 147*    CBC:  Recent Labs Lab 12/12/16 0438 12/13/16 0600  12/15/16 0430 12/16/16 0432 12/17/16 0500 12/17/16 2010 12/18/16 0310  WBC 8.5 6.7  < > 8.0 8.2 8.2 7.0 6.9  NEUTROABS 7.6 5.6  --   --   --   --   --   --   HGB 7.6* 7.7*  < > 7.5* 8.2* 6.6* 9.1* 8.9*  HCT 25.2* 25.4*  < > 25.3* 28.0* 23.1* 31.4* 30.6*  MCV 87.5 87.3  < > 87.5 87.8 89.2 88.2 88.7  PLT 173 201  < > 246 271 311 280 275  < > = values in this interval not displayed.  Basic Metabolic Panel:  Recent Labs Lab 12/12/16 0438 12/13/16 0600  12/15/16 0430 12/15/16 1720 12/16/16 0432 12/17/16 0500 12/18/16 0310  NA 146* 148*  < > 151* 152* 151* 153* 156*  K 3.9 3.4*  < > 3.0* 2.9* 2.9* 3.2* 3.4*  CL 115* 113*  < > 111 109 104 105 113*  CO2 23 23  < > 29 32 32 31 31  GLUCOSE 185* 177*  < > 203* 176* 189* 163* 152*  BUN 25* 25*  < > 18 18 17 20  22*  CREATININE 1.70* 1.79*  < > 1.89* 1.89* 2.02* 2.45* 2.41*  CALCIUM 8.2* 8.2*  < > 8.2* 8.6* 8.5*  8.4* 8.3*  MG 2.6* 2.3  --   --   --   --   --   --   < > = values in this interval not displayed.  Recent Results (from the past 240 hour(s))  Respiratory Panel by PCR     Status: None   Collection Time: 12/10/16  7:58 PM  Result Value Ref Range Status   Adenovirus NOT DETECTED NOT DETECTED Final   Coronavirus 229E NOT DETECTED NOT DETECTED Final   Coronavirus HKU1 NOT DETECTED NOT DETECTED Final   Coronavirus NL63 NOT DETECTED NOT DETECTED  Final   Coronavirus OC43 NOT DETECTED NOT DETECTED Final   Metapneumovirus NOT DETECTED NOT DETECTED Final   Rhinovirus / Enterovirus NOT DETECTED NOT DETECTED Final   Influenza A NOT DETECTED NOT DETECTED Final   Influenza B NOT DETECTED NOT DETECTED Final   Parainfluenza Virus 1 NOT DETECTED NOT DETECTED Final   Parainfluenza Virus 2 NOT DETECTED NOT DETECTED Final   Parainfluenza Virus 3 NOT DETECTED NOT DETECTED Final   Parainfluenza Virus 4 NOT DETECTED NOT DETECTED Final   Respiratory Syncytial Virus NOT DETECTED NOT DETECTED Final   Bordetella pertussis NOT DETECTED NOT DETECTED Final   Chlamydophila pneumoniae NOT DETECTED NOT DETECTED Final   Mycoplasma pneumoniae NOT DETECTED NOT DETECTED Final  Blood Culture (routine x 2)     Status: Abnormal   Collection Time: 12/10/16  8:18 PM  Result Value Ref Range Status   Specimen Description BLOOD LEFT HAND  Final   Special Requests IN PEDIATRIC BOTTLE  Final   Culture  Setup Time   Final    GRAM POSITIVE COCCI IN CLUSTERS IN PEDIATRIC BOTTLE CRITICAL RESULT CALLED TO, READ BACK BY AND VERIFIED WITH: A ANDERSON 12/12/16 @ 0807 M VESTAL    Culture (A)  Final    STAPHYLOCOCCUS SPECIES (COAGULASE NEGATIVE) THE SIGNIFICANCE OF ISOLATING THIS ORGANISM FROM A SINGLE SET OF BLOOD CULTURES WHEN MULTIPLE SETS ARE DRAWN IS UNCERTAIN. PLEASE NOTIFY THE MICROBIOLOGY DEPARTMENT WITHIN ONE WEEK IF SPECIATION AND SENSITIVITIES ARE REQUIRED.    Report Status 12/14/2016 FINAL  Final  Blood Culture ID  Panel (Reflexed)     Status: Abnormal   Collection Time: 12/10/16  8:18 PM  Result Value Ref Range Status   Enterococcus species NOT DETECTED NOT DETECTED Final   Listeria monocytogenes NOT DETECTED NOT DETECTED Final   Staphylococcus species DETECTED (A) NOT DETECTED Final    Comment: CRITICAL RESULT CALLED TO, READ BACK BY AND VERIFIED WITH: A ANDERSON 12/12/16 @ 0807 M VESTAL    Staphylococcus aureus NOT DETECTED NOT DETECTED Final   Methicillin resistance NOT DETECTED NOT DETECTED Final   Streptococcus species NOT DETECTED NOT DETECTED Final   Streptococcus agalactiae NOT DETECTED NOT DETECTED Final   Streptococcus pneumoniae NOT DETECTED NOT DETECTED Final   Streptococcus pyogenes NOT DETECTED NOT DETECTED Final   Acinetobacter baumannii NOT DETECTED NOT DETECTED Final   Enterobacteriaceae species NOT DETECTED NOT DETECTED Final   Enterobacter cloacae complex NOT DETECTED NOT DETECTED Final   Escherichia coli NOT DETECTED NOT DETECTED Final   Klebsiella oxytoca NOT DETECTED NOT DETECTED Final   Klebsiella pneumoniae NOT DETECTED NOT DETECTED Final   Proteus species NOT DETECTED NOT DETECTED Final   Serratia marcescens NOT DETECTED NOT DETECTED Final   Haemophilus influenzae NOT DETECTED NOT DETECTED Final   Neisseria meningitidis NOT DETECTED NOT DETECTED Final   Pseudomonas aeruginosa NOT DETECTED NOT DETECTED Final   Candida albicans NOT DETECTED NOT DETECTED Final   Candida glabrata NOT DETECTED NOT DETECTED Final   Candida krusei NOT DETECTED NOT DETECTED Final   Candida parapsilosis NOT DETECTED NOT DETECTED Final   Candida tropicalis NOT DETECTED NOT DETECTED Final  Urine culture     Status: None   Collection Time: 12/10/16  9:37 PM  Result Value Ref Range Status   Specimen Description URINE, CATHETERIZED  Final   Special Requests NONE  Final   Culture NO GROWTH  Final   Report Status 12/12/2016 FINAL  Final  Blood Culture (routine x 2)  Status: None   Collection  Time: 12/10/16 11:08 PM  Result Value Ref Range Status   Specimen Description BLOOD LEFT HAND  Final   Special Requests IN PEDIATRIC BOTTLE  Final   Culture NO GROWTH 5 DAYS  Final   Report Status 12/16/2016 FINAL  Final     Liver Function Tests:  Recent Labs Lab 12/15/16 1720  ALBUMIN 2.2*   No results for input(s): LIPASE, AMYLASE in the last 168 hours.  Recent Labs Lab 12/12/16 1420  AMMONIA 30    Cardiac Enzymes: No results for input(s): CKTOTAL, CKMB, CKMBINDEX, TROPONINI in the last 168 hours. BNP (last 3 results)  Recent Labs  10/13/16 1320 11/09/16 1302  BNP 114.8* 79.9    ProBNP (last 3 results) No results for input(s): PROBNP in the last 8760 hours.    Studies: Dg Swallowing Func-speech Pathology  Result Date: 12/17/2016 Objective Swallowing Evaluation: Type of Study: MBS-Modified Barium Swallow Study Patient Details Name: Jodie Leiner MRN: [id number] Date of Birth: 1928-12-31 Today's Date: 12/17/2016 Time: SLP Start Time (ACUTE ONLY): 1047-SLP Stop Time (ACUTE ONLY): 1108 SLP Time Calculation (min) (ACUTE ONLY): 21 min Past Medical History: Past Medical History: Diagnosis Date . A-fib (HCC)  . Arthritis  . CAD (coronary artery disease)  . COPD (chronic obstructive pulmonary disease) (HCC)  . Diabetes (HCC)  . Former smoker  . Gout  . Hard of hearing  . Heart failure (HCC)  . Hyperlipidemia  . IBS (irritable bowel syndrome)  . Ischemic cardiomyopathy  . On home oxygen therapy   "2L q hs" (10/13/2016) . OSA on CPAP  . Renal insufficiency  . Rheumatoid arthritis (HCC)  . RLS (restless legs syndrome)  . Vitamin D deficiency  Past Surgical History: Past Surgical History: Procedure Laterality Date . APPENDECTOMY   . CARDIAC SURGERY    BY PASS . COLONOSCOPY WITH PROPOFOL N/A 11/25/2016  Procedure: COLONOSCOPY WITH PROPOFOL;  Surgeon: Iva Boop, MD;  Location: [facility] ENDOSCOPY;  Service: Endoscopy;  Laterality: N/A; . ESOPHAGOGASTRODUODENOSCOPY N/A 11/24/2016   Procedure: ESOPHAGOGASTRODUODENOSCOPY (EGD);  Surgeon: Iva Boop, MD;  Location: [facility] ENDOSCOPY;  Service: Endoscopy;  Laterality: N/A; . FINGER AMPUTATION Left   RING FINGER . GIVENS CAPSULE STUDY N/A 11/25/2016  Procedure: GIVENS CAPSULE STUDY;  Surgeon: Iva Boop, MD;  Location: [facility] ENDOSCOPY;  Service: Endoscopy;  Laterality: N/A; . NECK SURGERY   . TONSILLECTOMY   HPI: 83 y.o.malewith medical history significant of A. fib, osteoarthritis, CAD, diastolic dysfunction, COPD, type 2 diabetes, gout, hyperlipidemia, IBS, obstructive sleep apnea on CPAP, renal insufficiency, rheumatoid arthritis, restless leg syndrome who was recently discharged from the hospital due to GI bleed secondary to anticoagulation for atrial fibrillation, now brought from nursing home facility for evaluation of altered mental status and fever for the past 2 days. Dx HCAP, acute on chronic renal insufficiency. CXR 12/10/16 impression: Mild hazy atelectasis or infiltrate at the right infrahilar lung. Stable borderline to mild cardiomegaly without overt failure. Head CT 12/10/16 showed no acute findings. FEES 1/24 unable to fully determine safety due to obstucted view (po's on scope tip), therefore MBS today.  Subjective: "Oh, I can't do it" Assessment / Plan / Recommendation CHL IP CLINICAL IMPRESSIONS 12/17/2016 Therapy Diagnosis Mild oral phase dysphagia;Moderate pharyngeal phase dysphagia Clinical Impression Pt exhibits mild oral and moderate pharyngeal dysphagia. Oral deficits included decreased oral cohesion resulting in sublingual spilling and prolonged oral transit time. Pt presented with a suspected structual based pharyngeal dysphagia due to presence of bony structure (  osteophyte? not confirmed by radiologist) at the level of cervical vertebrae preventing epiglottic inversion resulting in silent penetration to the level of the vocal cords with thin liquids via straw. Pt may have experienced premorbid swallowing difficulties due to  cervical protrusion and compensated for however now unable due to present illness and decreased functional reserve. Laryngeal elevation mildly decreased. He exhibited moderate vallecular/pyriform sinuses residue and was unable to perform second volitional swallow and mildly delayed swallow initiation. Pt is at higher aspiration risk with all consistencies. If family agreeable to comfort feeds, SLP recommends Dys 1 diet, thin liquids (no straws), meds crushed in applesauce. Will follow for education needs with family.  Impact on safety and function Severe aspiration risk   CHL IP TREATMENT RECOMMENDATION 12/17/2016 Treatment Recommendations Therapy as outlined in treatment plan below   Prognosis 12/17/2016 Prognosis for Safe Diet Advancement (No Data) Barriers to Reach Goals Cognitive deficits Barriers/Prognosis Comment -- CHL IP DIET RECOMMENDATION 12/17/2016 SLP Diet Recommendations Thin liquid;Dysphagia 1 (Puree) solids Liquid Administration via Cup;No straw Medication Administration Crushed with puree Compensations Slow rate;Small sips/bites Postural Changes Seated upright at 90 degrees   CHL IP OTHER RECOMMENDATIONS 12/17/2016 Recommended Consults -- Oral Care Recommendations Oral care BID Other Recommendations --   CHL IP FOLLOW UP RECOMMENDATIONS 12/17/2016 Follow up Recommendations Skilled Nursing facility   [facility] IP FREQUENCY AND DURATION 12/17/2016 Speech Therapy Frequency (ACUTE ONLY) min 2x/week Treatment Duration 2 weeks      CHL IP ORAL PHASE 12/17/2016 Oral Phase Impaired Oral - Pudding Teaspoon -- Oral - Pudding Cup -- Oral - Honey Teaspoon -- Oral - Honey Cup -- Oral - Nectar Teaspoon -- Oral - Nectar Cup Decreased bolus cohesion;Lingual/palatal residue;Weak lingual manipulation;Delayed oral transit Oral - Nectar Straw -- Oral - Thin Teaspoon -- Oral - Thin Cup Decreased bolus cohesion;Lingual/palatal residue;Weak lingual manipulation;Delayed oral transit Oral - Thin Straw Delayed oral transit;Decreased bolus  cohesion;Weak lingual manipulation;Lingual/palatal residue Oral - Puree NT Oral - Mech Soft -- Oral - Regular Delayed oral transit;Decreased bolus cohesion;Weak lingual manipulation;Lingual/palatal residue Oral - Multi-Consistency -- Oral - Pill -- Oral Phase - Comment --  CHL IP PHARYNGEAL PHASE 12/17/2016 Pharyngeal Phase Impaired Pharyngeal- Pudding Teaspoon -- Pharyngeal -- Pharyngeal- Pudding Cup -- Pharyngeal -- Pharyngeal- Honey Teaspoon -- Pharyngeal -- Pharyngeal- Honey Cup Reduced laryngeal elevation;Reduced epiglottic inversion;Pharyngeal residue - valleculae;Pharyngeal residue - posterior pharnyx Pharyngeal -- Pharyngeal- Nectar Teaspoon -- Pharyngeal -- Pharyngeal- Nectar Cup Pharyngeal residue - valleculae;Reduced epiglottic inversion;Reduced laryngeal elevation;Pharyngeal residue - posterior pharnyx;Penetration/Aspiration before swallow Pharyngeal (No Data) Pharyngeal- Nectar Straw -- Pharyngeal -- Pharyngeal- Thin Teaspoon -- Pharyngeal -- Pharyngeal- Thin Cup Delayed swallow initiation-vallecula;Pharyngeal residue - valleculae;Pharyngeal residue - posterior pharnyx;Reduced laryngeal elevation;Reduced epiglottic inversion Pharyngeal -- Pharyngeal- Thin Straw Reduced epiglottic inversion;Reduced laryngeal elevation;Pharyngeal residue - pyriform;Delayed swallow initiation-pyriform sinuses;Pharyngeal residue - posterior pharnyx;Pharyngeal residue - valleculae;Penetration/Aspiration before swallow Pharyngeal -- Pharyngeal- Puree NT Pharyngeal -- Pharyngeal- Mechanical Soft -- Pharyngeal -- Pharyngeal- Regular Delayed swallow initiation-vallecula;Pharyngeal residue - valleculae;Reduced tongue base retraction Pharyngeal -- Pharyngeal- Multi-consistency -- Pharyngeal -- Pharyngeal- Pill -- Pharyngeal -- Pharyngeal Comment --  CHL IP CERVICAL ESOPHAGEAL PHASE 12/17/2016 Cervical Esophageal Phase WFL Pudding Teaspoon -- Pudding Cup -- Honey Teaspoon -- Honey Cup -- Nectar Teaspoon -- Nectar Cup -- Nectar Straw  -- Thin Teaspoon -- Thin Cup -- Thin Straw -- Puree -- Mechanical Soft -- Regular -- Multi-consistency -- Pill -- Cervical Esophageal Comment -- No flowsheet data found. Royce Macadamia 12/17/2016, 2:14 PM  Breck Coons [employer] [employer] Personnel officer  [phone]              Scheduled Meds: . chlorhexidine  15 mL Mouth Rinse BID  . mouth rinse  15 mL Mouth Rinse q12n4p  . morphine CONCENTRATE  5 mg Oral Q4H  . pantoprazole (PROTONIX) IV  40 mg Intravenous Q12H      Time spent: 25 min  [facility]   [facility] Pager [phone]. If 7PM-7AM, please contact night-coverage at [email or web address], Office  [phone]  password [email or web address] 12/18/2016, 4:19 PM  LOS: 8 days

## 2016-12-18 NOTE — Care Management Note (Signed)
Case Management Note  Patient Details  Name: Devin Heath MRN: [id number] Date of Birth: May 06, 1929  Subjective/Objective:  Patient is a recent admit with GIB secondary to anticiagulation for afib, he is from a SNF, presenting with on this admission, AMS and Fever , HCAP, complicated by afib with rvr and acute/chronic heart failure.  Family meeting today with palliative to decide which hospice venue to go with, they have decided on residential hospice, CSW following.                               Action/Plan:   Expected Discharge Date:                  Expected Discharge Plan:  Hospice Medical Facility  In-House Referral:  Clinical Social Work  Discharge planning Services  CM Consult  Post Acute Care Choice:    Choice offered to:     DME Arranged:    DME Agency:     HH Arranged:    HH Agency:     Status of Service:  Completed, signed off  If discussed at [employer], dates discussed:    Additional Comments:  Leone Haven, RN 12/18/2016, 3:27 PM

## 2016-12-18 NOTE — Progress Notes (Signed)
Pt will be going to [facility] in the am.  Hospice prefers that pt leaves the hospital by 11:00 am.  Weekend CSW informed and will facilitate d/c.  Pollyann Savoy, LCSW Evening/ED Coverage [phone]

## 2016-12-18 NOTE — Clinical Social Work Note (Signed)
Clinical Social Work Assessment  Patient Details  Name: Devin Heath MRN: [id number] Date of Birth: 12-Jul-1929  Date of referral:  12/18/16               Reason for consult:  Facility Placement, Discharge Planning, End of Life/Hospice                Permission sought to share information with:  Facility Sport and exercise psychologist, Family Supports Permission granted to share information::  Yes, Verbal Permission Granted  Name::     Kha Hari  Agency::  [facility]  Relationship::  Daughter-in-law  Contact Information:  [phone]  Housing/Transportation Living arrangements for the past 2 months:  [place], [place] of Information:  Medical Team, Adult Children, Facility, Palliative Care Team, Spouse, Other (Comment Required) (Daughter-in-law) Patient Interpreter Needed:  None Criminal Activity/Legal Involvement Pertinent to Current Situation/Hospitalization:  No - Comment as needed Significant Relationships:  Adult Children, [place], Friend, Other Family Members, Pets, Spouse Lives with:  Spouse Do you feel safe going back to the place where you live?  No Need for family participation in patient care:  Yes (Comment)  Care giving concerns:  Patient now comfort care. Family deciding which hospice venue to go with.   Social Worker assessment / plan:  Patient oriented only to self. CSW met with patient's wife, daughter, daughter-in-law, another support, and palliative care NP in a family meeting in the conference room. CSW introduced role and explained that hospice options would be discussed. Patient was at [facility] SNF prior to admission and his wife was interested in him returning with hospice care but she would have to pay privately for room and board (approximately $260 per day). First preference hospice facility is [facility], second is [facility] in [place]. Per admissions coordinator at [facility],  [place] will cover at 100% if he needs symptom management, which palliative care says he will require. Patient's wife gave permission to make referral to [facility]. Referral made and hospital liason has set up a meeting with patient's family at 3:30 pm. They do have several beds available. Patient's family updated. Patient's family do not want hospice mentioned around the patient. No further concerns. CSW encouraged patient's family to contact CSW as needed. CSW will continue to follow patient and his family for support and facilitate discharge to residential hospice once medically stable.  Employment status:  Retired Forensic scientist:  Medicare PT Recommendations:  [place] / Referral to community resources:  Other (Comment Required), [place] (Residential hospice facilities)  Patient/Family's Response to care:  Patient not fully oriented. Patient's wife and family are agreeable to residential hospice at [facility]. Patient's family and friends are supportive and involved in patient's care. Patient's family appreciated social work intervention.  Patient/Family's Understanding of and Emotional Response to Diagnosis, Current Treatment, and Prognosis:  Patient not fully oriented. Patient's family have a good understanding of reason for admission and prognosis. Patient's family state they are very happy with hospital care.  Emotional Assessment Appearance:  Appears stated age Attitude/Demeanor/Rapport:  Unable to Assess Affect (typically observed):  Unable to Assess Orientation:  Oriented to Self Alcohol / Substance use:  Never Used Psych involvement (Current and /or in the community):  No (Comment)  Discharge Needs  Concerns to be addressed:  Care Coordination Readmission within the last 30 days:  Yes Current discharge risk:  Cognitively Impaired, Dependent with Mobility, Terminally ill  Barriers to Discharge:  Continued Medical Work  up   Candie Chroman, LCSW 12/18/2016, 2:38 PM

## 2016-12-18 NOTE — Progress Notes (Signed)
Patient does not wish to wear CPAP tonight. RN at bedside.

## 2016-12-18 NOTE — Clinical Social Work Note (Signed)
CSW meeting with patient's family at 1:30 to discuss hospice options.  Charlynn Court, CSW [phone]

## 2016-12-18 NOTE — Progress Notes (Signed)
Patient ID: Devin Heath, male   DOB: 02-Aug-1929, 83 y.o.   MRN: [id number]    SUBJECTIVE:   Weight up 3 pounds. Creatinine unchanged. Hemoglobin up to 8.9 after 1UPRBCs.     Denies SOB  Echo (1/18): EF 60-65%, mild aortic stenosis, mild to moderate MR, PASP 70 mmHg.   Scheduled Meds: . carvedilol  6.25 mg Oral BID WC  . chlorhexidine  15 mL Mouth Rinse BID  . ferrous sulfate  325 mg Oral BID WC  . insulin aspart  0-15 Units Subcutaneous TID WC  . mouth rinse  15 mL Mouth Rinse q12n4p  . pantoprazole (PROTONIX) IV  40 mg Intravenous Q12H  . pravastatin  20 mg Oral Daily  . torsemide  40 mg Oral Daily   Continuous Infusions: . diltiazem (CARDIZEM) infusion 15 mg/hr (12/18/16 0600)   PRN Meds:.acetaminophen, HYDROcodone-acetaminophen, morphine CONCENTRATE, RESOURCE THICKENUP CLEAR, sodium chloride flush    Vitals:   12/18/16 0500 12/18/16 0600 12/18/16 0700 12/18/16 0749  BP: (!) 117/42 107/67 (!) 125/54 (!) 120/94  Pulse: (!) 102 (!) 113 (!) 109 100  Resp: 16 17 (!) 25 20  Temp:    97.5 F (36.4 C)  TempSrc:    Oral  SpO2: 92% 96% (!) 88% 90%  Weight:      Height:        Intake/Output Summary (Last 24 hours) at 12/18/16 0829 Last data filed at 12/18/16 0600  Gross per 24 hour  Intake           863.84 ml  Output              425 ml  Net           438.84 ml    LABS: Basic Metabolic Panel:  Recent Labs  89/16/94 0500 12/18/16 0310  NA 153* 156*  K 3.2* 3.4*  CL 105 113*  CO2 31 31  GLUCOSE 163* 152*  BUN 20 22*  CREATININE 2.45* 2.41*  CALCIUM 8.4* 8.3*   Liver Function Tests:  Recent Labs  12/15/16 1720  ALBUMIN 2.2*   No results for input(s): LIPASE, AMYLASE in the last 72 hours. CBC:  Recent Labs  12/17/16 2010 12/18/16 0310  WBC 7.0 6.9  HGB 9.1* 8.9*  HCT 31.4* 30.6*  MCV 88.2 88.7  PLT 280 275   Cardiac Enzymes: No results for input(s): CKTOTAL, CKMB, CKMBINDEX, TROPONINI in the last 72 hours. BNP: Invalid input(s):  POCBNP D-Dimer: No results for input(s): DDIMER in the last 72 hours. Hemoglobin A1C: No results for input(s): HGBA1C in the last 72 hours. Fasting Lipid Panel: No results for input(s): CHOL, HDL, LDLCALC, TRIG, CHOLHDL, LDLDIRECT in the last 72 hours. Thyroid Function Tests: No results for input(s): TSH, T4TOTAL, T3FREE, THYROIDAB in the last 72 hours.  Invalid input(s): FREET3 Anemia Panel: No results for input(s): VITAMINB12, FOLATE, FERRITIN, TIBC, IRON, RETICCTPCT in the last 72 hours.  RADIOLOGY: Dg Cervical Spine 2 Or 3 Views  Result Date: 11/26/2016 CLINICAL DATA:  neck pain EXAM: CERVICAL SPINE - 2-3 VIEW COMPARISON:  None. FINDINGS: Normal alignment of the cervical spine. Ventral syndesmophytes are noted from C2 through C6. The disc spaces are relatively well preserved. No fracture identified. IMPRESSION: 1. Multi level ventral syndesmophytes noted which may reflect diffuse idiopathic skeletal hyperostosis (DISH) Electronically Signed   By: Signa Kell M.D.   On: 11/26/2016 15:06   Dg Thoracic Spine 2 View  Result Date: 11/26/2016 CLINICAL DATA:  Back pain for 6 weeks. EXAM:  THORACIC SPINE 2 VIEWS COMPARISON:  None. FINDINGS: Exaggerated thoracic kyphosis but normal alignment of the vertebral bodies. There are findings suggestive of DISH. No acute fracture or bone lesion. No abnormal paraspinal soft tissue swelling. Tortuosity and calcification of the thoracic aorta is noted. The visualized posterior ribs are intact. IMPRESSION: Moderate to advanced degenerative changes involving the spine with probable changes of DISH. Exaggerated thoracic kyphosis but no acute fracture. Electronically Signed   By: Rudie Meyer M.D.   On: 11/26/2016 15:13   Ct Head Wo Contrast  Result Date: 12/10/2016 CLINICAL DATA:  Fever and hypotension, slurred speech, suspect sepsis. Assess for CVA. EXAM: CT HEAD WITHOUT CONTRAST TECHNIQUE: Contiguous axial images were obtained from the base of the skull  through the vertex without intravenous contrast. COMPARISON:  None. FINDINGS: Brain: There is generalized age related parenchymal atrophy with commensurate dilatation of the ventricles and sulci. Mild chronic small vessel ischemic changes noted within the deep periventricular white matter regions bilaterally. There is no mass, hemorrhage, edema or other evidence of acute parenchymal abnormality. No extra-axial hemorrhage. Vascular: There are chronic calcified atherosclerotic changes of the large vessels at the skull base. No unexpected hyperdense vessel. Skull: Normal. Negative for fracture or focal lesion. Sinuses/Orbits: Extensive chronic appearing mucosal thickening throughout the bilateral maxillary sinuses. Orbital/periorbital soft tissues are unremarkable. Other: None. IMPRESSION: 1. No acute findings.  No intracranial mass, hemorrhage or edema. 2. Chronic appearing paranasal sinus disease. Electronically Signed   By: Bary Richard M.D.   On: 12/10/2016 21:23   Ct Chest Wo Contrast  Result Date: 12/12/2016 CLINICAL DATA:  Sepsis.  Abnormal chest radiograph. EXAM: CT CHEST WITHOUT CONTRAST TECHNIQUE: Multidetector CT imaging of the chest was performed following the standard protocol without IV contrast. COMPARISON:  Chest radiograph from 12/10/2016 FINDINGS: Cardiovascular: The heart size is mildly enlarged. There is aortic atherosclerosis identified. Previous median sternotomy and CABG procedure. Mediastinum/Nodes: The trachea appears patent and is midline. Normal appearance of the esophagus. Calcified mediastinal and hilar lymph nodes are identified compatible with prior granulomatous disease. Noncalcified left paratracheal lymph node measures 1.2 cm, image 50 of series 201. Noncalcified right paratracheal lymph node measures 1.2 cm, image 52 of series 201. Lungs/Pleura: Diffuse bronchial wall thickening. There are moderate bilateral pleural effusions. There is mild overlying compressive type atelectasis.  Interlobular septal thickening within the lung bases compatible with mild edema. A few clustered non-specific pulmonary nodules noted in the left lower lobe noted measure up to 7 mm, image 67 series 205. Upper Abdomen: No acute abnormality. Musculoskeletal: No chest wall mass or suspicious bone lesions identified. IMPRESSION: 1. No evidence for pneumonia. 2. Mild cardiac enlargement, aortic atherosclerosis, mild edema and bilateral pleural effusions. Suspected congestive heart failure. 3. Prior granulomatous disease. 4. Small nonspecific nodules clustered in the left lower lobe. Non-contrast chest CT at 3-6 months is recommended. If the nodules are stable at time of repeat CT, then future CT at 18-24 months (from today's scan) is considered optional for low-risk patients, but is recommended for high-risk patients. This recommendation follows the consensus statement: Guidelines for Management of Incidental Pulmonary Nodules Detected on CT Images:From the Fleischner Society 2017; published online before print (10.1148/radiol.[id number]). Electronically Signed   By: Signa Kell M.D.   On: 12/12/2016 09:39   Dg Chest Portable 1 View  Result Date: 12/10/2016 CLINICAL DATA:  Fever EXAM: PORTABLE CHEST 1 VIEW COMPARISON:  11/25/2016 FINDINGS: Median sternotomy wires are again evident. A right upper extremity catheter tip is difficult to visualize, catheter  tubing is seen to the level of SVC. Mild atelectasis or infiltrate at the right lung base. No effusion. Stable cardiomediastinal silhouette. No pneumothorax. IMPRESSION: Mild hazy atelectasis or infiltrate at the right infrahilar lung. Stable borderline to mild cardiomegaly without overt failure. Electronically Signed   By: Jasmine Pang M.D.   On: 12/10/2016 23:18   Dg Chest Port 1 View  Result Date: 11/25/2016 CLINICAL DATA:  Leukocytosis. EXAM: PORTABLE CHEST 1 VIEW COMPARISON:  11/24/2016 FINDINGS: Prior CABG. Heart is borderline in size. Diffuse  interstitial prominence throughout the lungs with peribronchial thickening. Mild hyperinflation. No confluent opacity or effusion. No acute bony abnormality. IMPRESSION: Hyperinflation/ COPD.  Chronic bronchitic changes. Electronically Signed   By: Charlett Nose M.D.   On: 11/25/2016 09:41   Dg Chest Port 1 View  Result Date: 11/24/2016 CLINICAL DATA:  Initial evaluation for acute shortness of breath. EXAM: PORTABLE CHEST 1 VIEW COMPARISON:  Prior radiograph from 11/21/2016. FINDINGS: Median sternotomy wires with underlying CABG markers and surgical clips, stable. Mild cardiomegaly is unchanged. Mediastinal silhouette within normal limits. Aortic atherosclerosis noted. Lungs normally inflated. No focal infiltrates. No pulmonary edema or pleural effusion. No pneumothorax. No acute osseous abnormality. IMPRESSION: 1. No active cardiopulmonary disease. 2. Mild cardiomegaly with sequelae of prior CABG. Electronically Signed   By: Rise Mu M.D.   On: 11/24/2016 22:00   Dg Chest Portable 1 View  Result Date: 11/21/2016 CLINICAL DATA:  Pt with weight gain this AM of 2 lbs, pt has increased SOB. Hx CHF, COPD, CAD, afib EXAM: PORTABLE CHEST 1 VIEW COMPARISON:  10/13/2016 FINDINGS: Status post median sternotomy and CABG. Heart size is normal. Lungs are clear. No pulmonary edema. Degenerative changes are seen in thoracic spine. IMPRESSION: No evidence for acute  abnormality. Electronically Signed   By: Norva Pavlov M.D.   On: 11/21/2016 17:03   Dg Swallowing Func-speech Pathology  Result Date: 12/17/2016 Objective Swallowing Evaluation: Type of Study: MBS-Modified Barium Swallow Study Patient Details Name: Cole Furrh MRN: [id number] Date of Birth: 1928/12/07 Today's Date: 12/17/2016 Time: SLP Start Time (ACUTE ONLY): 1047-SLP Stop Time (ACUTE ONLY): 1108 SLP Time Calculation (min) (ACUTE ONLY): 21 min Past Medical History: Past Medical History: Diagnosis Date . A-fib (HCC)  . Arthritis  . CAD  (coronary artery disease)  . COPD (chronic obstructive pulmonary disease) (HCC)  . Diabetes (HCC)  . Former smoker  . Gout  . Hard of hearing  . Heart failure (HCC)  . Hyperlipidemia  . IBS (irritable bowel syndrome)  . Ischemic cardiomyopathy  . On home oxygen therapy   "2L q hs" (10/13/2016) . OSA on CPAP  . Renal insufficiency  . Rheumatoid arthritis (HCC)  . RLS (restless legs syndrome)  . Vitamin D deficiency  Past Surgical History: Past Surgical History: Procedure Laterality Date . APPENDECTOMY   . CARDIAC SURGERY    BY PASS . COLONOSCOPY WITH PROPOFOL N/A 11/25/2016  Procedure: COLONOSCOPY WITH PROPOFOL;  Surgeon: Iva Boop, MD;  Location: [facility] ENDOSCOPY;  Service: Endoscopy;  Laterality: N/A; . ESOPHAGOGASTRODUODENOSCOPY N/A 11/24/2016  Procedure: ESOPHAGOGASTRODUODENOSCOPY (EGD);  Surgeon: Iva Boop, MD;  Location: [facility] ENDOSCOPY;  Service: Endoscopy;  Laterality: N/A; . FINGER AMPUTATION Left   RING FINGER . GIVENS CAPSULE STUDY N/A 11/25/2016  Procedure: GIVENS CAPSULE STUDY;  Surgeon: Iva Boop, MD;  Location: [facility] ENDOSCOPY;  Service: Endoscopy;  Laterality: N/A; . NECK SURGERY   . TONSILLECTOMY   HPI: 83 y.o.malewith medical history significant of A. fib, osteoarthritis, CAD, diastolic dysfunction,  COPD, type 2 diabetes, gout, hyperlipidemia, IBS, obstructive sleep apnea on CPAP, renal insufficiency, rheumatoid arthritis, restless leg syndrome who was recently discharged from the hospital due to GI bleed secondary to anticoagulation for atrial fibrillation, now brought from nursing home facility for evaluation of altered mental status and fever for the past 2 days. Dx HCAP, acute on chronic renal insufficiency. CXR 12/10/16 impression: Mild hazy atelectasis or infiltrate at the right infrahilar lung. Stable borderline to mild cardiomegaly without overt failure. Head CT 12/10/16 showed no acute findings. FEES 1/24 unable to fully determine safety due to obstucted view (po's on scope tip), therefore  MBS today.  Subjective: "Oh, I can't do it" Assessment / Plan / Recommendation CHL IP CLINICAL IMPRESSIONS 12/17/2016 Therapy Diagnosis Mild oral phase dysphagia;Moderate pharyngeal phase dysphagia Clinical Impression Pt exhibits mild oral and moderate pharyngeal dysphagia. Oral deficits included decreased oral cohesion resulting in sublingual spilling and prolonged oral transit time. Pt presented with a suspected structual based pharyngeal dysphagia due to presence of bony structure (osteophyte? not confirmed by radiologist) at the level of cervical vertebrae preventing epiglottic inversion resulting in silent penetration to the level of the vocal cords with thin liquids via straw. Pt may have experienced premorbid swallowing difficulties due to cervical protrusion and compensated for however now unable due to present illness and decreased functional reserve. Laryngeal elevation mildly decreased. He exhibited moderate vallecular/pyriform sinuses residue and was unable to perform second volitional swallow and mildly delayed swallow initiation. Pt is at higher aspiration risk with all consistencies. If family agreeable to comfort feeds, SLP recommends Dys 1 diet, thin liquids (no straws), meds crushed in applesauce. Will follow for education needs with family.  Impact on safety and function Severe aspiration risk   CHL IP TREATMENT RECOMMENDATION 12/17/2016 Treatment Recommendations Therapy as outlined in treatment plan below   Prognosis 12/17/2016 Prognosis for Safe Diet Advancement (No Data) Barriers to Reach Goals Cognitive deficits Barriers/Prognosis Comment -- CHL IP DIET RECOMMENDATION 12/17/2016 SLP Diet Recommendations Thin liquid;Dysphagia 1 (Puree) solids Liquid Administration via Cup;No straw Medication Administration Crushed with puree Compensations Slow rate;Small sips/bites Postural Changes Seated upright at 90 degrees   CHL IP OTHER RECOMMENDATIONS 12/17/2016 Recommended Consults -- Oral Care  Recommendations Oral care BID Other Recommendations --   CHL IP FOLLOW UP RECOMMENDATIONS 12/17/2016 Follow up Recommendations Skilled Nursing facility   [facility] IP FREQUENCY AND DURATION 12/17/2016 Speech Therapy Frequency (ACUTE ONLY) min 2x/week Treatment Duration 2 weeks      CHL IP ORAL PHASE 12/17/2016 Oral Phase Impaired Oral - Pudding Teaspoon -- Oral - Pudding Cup -- Oral - Honey Teaspoon -- Oral - Honey Cup -- Oral - Nectar Teaspoon -- Oral - Nectar Cup Decreased bolus cohesion;Lingual/palatal residue;Weak lingual manipulation;Delayed oral transit Oral - Nectar Straw -- Oral - Thin Teaspoon -- Oral - Thin Cup Decreased bolus cohesion;Lingual/palatal residue;Weak lingual manipulation;Delayed oral transit Oral - Thin Straw Delayed oral transit;Decreased bolus cohesion;Weak lingual manipulation;Lingual/palatal residue Oral - Puree NT Oral - Mech Soft -- Oral - Regular Delayed oral transit;Decreased bolus cohesion;Weak lingual manipulation;Lingual/palatal residue Oral - Multi-Consistency -- Oral - Pill -- Oral Phase - Comment --  CHL IP PHARYNGEAL PHASE 12/17/2016 Pharyngeal Phase Impaired Pharyngeal- Pudding Teaspoon -- Pharyngeal -- Pharyngeal- Pudding Cup -- Pharyngeal -- Pharyngeal- Honey Teaspoon -- Pharyngeal -- Pharyngeal- Honey Cup Reduced laryngeal elevation;Reduced epiglottic inversion;Pharyngeal residue - valleculae;Pharyngeal residue - posterior pharnyx Pharyngeal -- Pharyngeal- Nectar Teaspoon -- Pharyngeal -- Pharyngeal- Nectar Cup Pharyngeal residue - valleculae;Reduced epiglottic inversion;Reduced laryngeal elevation;Pharyngeal residue -  posterior pharnyx;Penetration/Aspiration before swallow Pharyngeal (No Data) Pharyngeal- Nectar Straw -- Pharyngeal -- Pharyngeal- Thin Teaspoon -- Pharyngeal -- Pharyngeal- Thin Cup Delayed swallow initiation-vallecula;Pharyngeal residue - valleculae;Pharyngeal residue - posterior pharnyx;Reduced laryngeal elevation;Reduced epiglottic inversion Pharyngeal --  Pharyngeal- Thin Straw Reduced epiglottic inversion;Reduced laryngeal elevation;Pharyngeal residue - pyriform;Delayed swallow initiation-pyriform sinuses;Pharyngeal residue - posterior pharnyx;Pharyngeal residue - valleculae;Penetration/Aspiration before swallow Pharyngeal -- Pharyngeal- Puree NT Pharyngeal -- Pharyngeal- Mechanical Soft -- Pharyngeal -- Pharyngeal- Regular Delayed swallow initiation-vallecula;Pharyngeal residue - valleculae;Reduced tongue base retraction Pharyngeal -- Pharyngeal- Multi-consistency -- Pharyngeal -- Pharyngeal- Pill -- Pharyngeal -- Pharyngeal Comment --  CHL IP CERVICAL ESOPHAGEAL PHASE 12/17/2016 Cervical Esophageal Phase WFL Pudding Teaspoon -- Pudding Cup -- Honey Teaspoon -- Honey Cup -- Nectar Teaspoon -- Nectar Cup -- Nectar Straw -- Thin Teaspoon -- Thin Cup -- Thin Straw -- Puree -- Mechanical Soft -- Regular -- Multi-consistency -- Pill -- Cervical Esophageal Comment -- No flowsheet data found. Royce Macadamia 12/17/2016, 2:14 PM  Breck Coons Lonell Face.Ed CCC-SLP Pager [phone]              PHYSICAL EXAM CVP 5-6 General: NAD. In bed.  Neck: JVP 6-7 cm, no thyromegaly or thyroid nodule.  Lungs: Clear to auscultation bilaterally with normal respiratory effort. CV: Nondisplaced PMI.  Heart mildly tachy, irregular S1/S2, no S3/S4, 1/6 SEM RUSB.  No edema.   Abdomen: Soft, nontender, no hepatosplenomegaly, no distention.  Neurologic: Alert, oriented to person/place.  Psych: Normal affect. Extremities: No clubbing or cyanosis. R and LLE SCDs.   TELEMETRY: A fib 100-110s  ASSESSMENT AND PLAN: 83 yo with history of CAD s/p CABG, CKD stage IV, chronic atrial fibrillation, and chronic diastolic CHF presented with PNA along with acute/chronic diastolic CHF.  1. Atrial fibrillation: Mild RVR on diltiazem gtt currently.  He is not on anticoagulation due to GI bleeding in 12/17.  - Stop diltiazem drip today and start diltiazem CD 360 mg daily.   - Continue Coreg.    2. Acute on chronic diastolic CHF: Weight up 3 pounds. CVP 5-6  Creatinine unchanged 2.4. Continue torsemide.  3. CKD: Stage IV.  Creatinine unchanged 2.4   4. ID: Suspect PNA versus acute bronchitis.  Febrile at admission.  Blood cultures with only 1/2 coag negative Staph, suspect contaminant.  - Currently on Zosyn per primary service.  5. CAD: s/p CABG.  Not on ASA with recent bleeding and current anemia.  Can continue home statin.  6. Anemia: Had recent GI bleed in 12/17 and Eliquis stopped. Hemoglobin up from 6.6> 8.9 after 1 UPRBCs.  7. OSA: CPAP at night.  8. Hypernatremia: Na 156. Encourage po intake.  9. Family considering hospice, he is DNR. Palliative Care appreciated.    Amy Clegg NP-C  12/18/2016 8:29 AM  Agree with above note.  Plan for comfort care/residential hospice noted.  Would continue rate control meds and torsemide for comfort. We will sign off at this point.   Marca Ancona 12/18/2016 2:25 PM

## 2016-12-19 DIAGNOSIS — E118 Type 2 diabetes mellitus with unspecified complications: Secondary | ICD-10-CM

## 2016-12-19 DIAGNOSIS — Z515 Encounter for palliative care: Secondary | ICD-10-CM

## 2016-12-19 DIAGNOSIS — E1165 Type 2 diabetes mellitus with hyperglycemia: Secondary | ICD-10-CM

## 2016-12-19 MED ORDER — HYDROMORPHONE HCL 1 MG/ML IJ SOLN
0.5000 mg | INTRAMUSCULAR | Status: AC | PRN
  Administered 2016-12-19 (×3): 0.5 mg via INTRAVENOUS
  Filled 2016-12-19 (×3): qty 1

## 2016-12-19 MED ORDER — LORAZEPAM 1 MG PO TABS: 1.0000 mg | ORAL_TABLET | ORAL | 0 refills | Status: AC | PRN

## 2016-12-19 MED ORDER — GLYCOPYRROLATE 1 MG PO TABS: 1.0000 mg | ORAL_TABLET | ORAL | Status: AC | PRN

## 2016-12-19 MED ORDER — MORPHINE SULFATE (CONCENTRATE) 10 MG/0.5ML PO SOLN
5.0000 mg | ORAL | 0 refills | Status: AC | PRN
Start: 2016-12-19 — End: ?

## 2016-12-19 MED ORDER — HALOPERIDOL 0.5 MG PO TABS: 0.5000 mg | ORAL_TABLET | ORAL | 0 refills | Status: AC | PRN

## 2016-12-19 NOTE — Progress Notes (Signed)
Palliative:  Received call from RN about Devin Heath in continuing pain and discomfort despite multiple doses of morphine this morning. Plan is for comfort and transition to hospice facility this morning. I have added Dilaudid 0.5 mg IV every 30 min prn x 3. RN to call if still discomfort. Would avoid morphine with worsening renal failure but will let hospice adjust as appropriate since d/c is soon.   Yong Channel, NP Palliative Medicine Team Pager # [phone] (M-F 8a-5p) Team Phone # [phone] (Nights/Weekends)

## 2016-12-19 NOTE — Discharge Summary (Addendum)
Physician Discharge Summary  Devin Heath GMW:[id number] DOB: 23-Apr-1929 DOA: 12/10/2016  PCP: [name], MD  Admit date: 12/10/2016 Discharge date: January 16, 2017  Time spent: 25* minutes  Recommendations for Outpatient Follow-up:  1. Patient to be discharged to [facility]   Discharge Diagnoses:  Principal Problem:   HCAP (healthcare-associated pneumonia) Active Problems:   Acute on chronic renal insufficiency   Hyperlipidemia   CAD (coronary artery disease)   Atrial fibrillation with rapid ventricular response (HCC)   OSA on CPAP   Lactic acidosis   Uncontrolled type 2 diabetes mellitus with complication (HCC)   Pressure injury of skin   Goals of care, counseling/discussion   Advance care planning   Palliative care by specialist   Pain, generalized   Terminal care   Discharge Condition: Stable  Diet recommendation: Comfort feed  Filed Weights   12/16/16 0446 12/17/16 0400 12/18/16 0300  Weight: 84.5 kg (186 lb 4.6 oz) 82.3 kg (181 lb 7 oz) 83.8 kg (184 lb 12.8 oz)    History of present illness:  83 y.o.malewith medical history significant of A. fib, osteoarthritis, CAD, diastolic dysfunction, COPD, type 2 diabetes, gout, hyperlipidemia, IBS, obstructive sleep apnea on CPAP, renal insufficiency, rheumatoid arthritis, restless leg syndrome who was recently discharged from the hospital due to GI bleed secondary to anticoagulation for atrial fibrillation, now brought from nursing home facility for evaluation of altered mental status and fever for the past 2 days. The patient was receiving ceftriaxone through [facility] line at the [facility] Course:  1. Sepsis- due to healthcare associated pneumonia, Patient was started on Zosyn, vancomycin Repeat CT chest showed bilateral pleural effusions. Lactic acid is now clear.Blood cultures 1 out of 2 bottles growing coagulase-negative staph. Zosyn has been discontinued as patient is now comfort care only. 2. Acute  hypoxic respiratory failure- multifactorial from pneumonia as well as acute diastolic CHF. Patient was started on on Lasix 80 mg IV twice a day per cardiology. Echo  showed stable EF and difficult to assess vascular dysfunction. Lasix was transitioned  to torsemide 40 mg daily. Torsemide has been discontinued as patient is comfort care only. 3. CAD status post CABG-patient was  on aspirin, statins. These are discontinued. 4. Hypernatremia- still has elevated sodium 156. No further labs 5. Hypokalemia-potassium is 3.4.No further labs. 6. Atrial fibrillation- CHA2DS2VASc score is 4. Started on Cardizem drip, metoprolol. Not anti-coagulated due to significant GI bleed. 7. Acute on chronic kidney stage III-today creatinine 2.02 due to high-dose diuresis.  8. Obstructive sleep apnea-continue CPAP 9. 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 10. Anemia of chronic disease- patient had recent GI bleed, hemoglobin was 6.6 . One unit PRBC transfused. Repeat hemoglobin is 8.9 11. Dysphagia- MBS show high risk of aspiration with all consistencies, speech therapy recommends Dys 1 diet as  family agrees to comfort care.  12. Patient will be discharged to [facility] for end of life care. Will continue oral Morphine prn, Haldol prn, Ativan prn.  Procedures:  None  Consultations:  Cardiology  Discharge Exam: Vitals:   01-16-17 0353 01-16-2017 0718  BP: 101/79 96/71  Pulse: (!) 117 72  Resp: (!) 30   Temp: 98.3 F (36.8 C) 98.1 F (36.7 C)    General: Appears in no acute distress Cardiovascular: RRR, S1S2 Respiratory: Clear bilaterally  Discharge Instructions   Discharge Instructions    Diet - low sodium heart healthy    Complete by:  As directed    Increase activity slowly  Complete by:  As directed      Current Discharge Medication List    START taking these medications   Details  glycopyrrolate (ROBINUL) 1 MG tablet Take 1 tablet (1 mg  total) by mouth every 4 (four) hours as needed (excessive secretions).    haloperidol (HALDOL) 0.5 MG tablet Take 1 tablet (0.5 mg total) by mouth every 4 (four) hours as needed for agitation (or delirium). Qty: 10 tablet, Refills: 0    LORazepam (ATIVAN) 1 MG tablet Take 1 tablet (1 mg total) by mouth every 4 (four) hours as needed for anxiety. Qty: 30 tablet, Refills: 0    Morphine Sulfate (MORPHINE CONCENTRATE) 10 MG/0.5ML SOLN concentrated solution Take 0.25 mLs (5 mg total) by mouth every 4 (four) hours as needed for severe pain. Qty: 180 mL, Refills: 0      CONTINUE these medications which have NOT CHANGED   Details  diphenhydrAMINE (ALLERGY RELIEF) 25 mg capsule Take 25 mg by mouth every 6 (six) hours as needed for itching or allergies.       STOP taking these medications     acetaminophen (TYLENOL) 500 MG tablet      albuterol (PROVENTIL HFA;VENTOLIN HFA) 108 (90 Base) MCG/ACT inhaler      allopurinol (ZYLOPRIM) 100 MG tablet      carvedilol (COREG) 25 MG tablet      cetirizine (ZYRTEC) 5 MG tablet      Cholecalciferol (VITAMIN D3) 1000 units CAPS      cholestyramine (QUESTRAN) 4 g packet      ciprofloxacin (CIPRO IN D5W) 400 MG/200ML SOLN      Coenzyme Q10 (CO Q-10) 100 MG CAPS      gabapentin (NEURONTIN) 300 MG capsule      HYDROcodone-acetaminophen (NORCO/VICODIN) 5-325 MG tablet      insulin glargine (LANTUS) 100 UNIT/ML injection      insulin glulisine (APIDRA) 100 UNIT/ML injection      metroNIDAZOLE (FLAGYL) 5-0.79 MG/ML-% IVPB      mirtazapine (REMERON) 15 MG tablet      omeprazole (PRILOSEC) 40 MG capsule      pravastatin (PRAVACHOL) 20 MG tablet      promethazine (PHENERGAN) 25 MG tablet      terazosin (HYTRIN) 2 MG capsule      tiotropium (SPIRIVA) 18 MCG inhalation capsule      torsemide (DEMADEX) 20 MG tablet      umeclidinium bromide (INCRUSE ELLIPTA) 62.5 MCG/INH AEPB      valACYclovir (VALTREX) 1000 MG tablet      pregabalin  (LYRICA) 75 MG capsule        Allergies  Allergen Reactions  . Ace Inhibitors Cough  . Ambrisentan Other (See Comments)    On MAR  . Clindamycin/Lincomycin Other (See Comments)    On MAR  . Erythromycin Other (See Comments)    On MAR  . Feldene [Piroxicam] Other (See Comments)    On MAR  . Keflex [Cephalexin] Other (See Comments)    On MAR  . Omnicef [Cefdinir] Other (See Comments)    On MAR  . Simvastatin Other (See Comments)    On MAR      The results of significant diagnostics from this hospitalization (including imaging, microbiology, ancillary and laboratory) are listed below for reference.    Significant Diagnostic Studies: Dg Cervical Spine 2 Or 3 Views  Result Date: 11/26/2016 CLINICAL DATA:  neck pain EXAM: CERVICAL SPINE - 2-3 VIEW COMPARISON:  None. FINDINGS: Normal alignment of the cervical spine.  Ventral syndesmophytes are noted from C2 through C6. The disc spaces are relatively well preserved. No fracture identified. IMPRESSION: 1. Multi level ventral syndesmophytes noted which may reflect diffuse idiopathic skeletal hyperostosis (DISH) Electronically Signed   By: Signa Kell M.D.   On: 11/26/2016 15:06   Dg Thoracic Spine 2 View  Result Date: 11/26/2016 CLINICAL DATA:  Back pain for 6 weeks. EXAM: THORACIC SPINE 2 VIEWS COMPARISON:  None. FINDINGS: Exaggerated thoracic kyphosis but normal alignment of the vertebral bodies. There are findings suggestive of DISH. No acute fracture or bone lesion. No abnormal paraspinal soft tissue swelling. Tortuosity and calcification of the thoracic aorta is noted. The visualized posterior ribs are intact. IMPRESSION: Moderate to advanced degenerative changes involving the spine with probable changes of DISH. Exaggerated thoracic kyphosis but no acute fracture. Electronically Signed   By: Rudie Meyer M.D.   On: 11/26/2016 15:13   Ct Head Wo Contrast  Result Date: 12/10/2016 CLINICAL DATA:  Fever and hypotension, slurred speech,  suspect sepsis. Assess for CVA. EXAM: CT HEAD WITHOUT CONTRAST TECHNIQUE: Contiguous axial images were obtained from the base of the skull through the vertex without intravenous contrast. COMPARISON:  None. FINDINGS: Brain: There is generalized age related parenchymal atrophy with commensurate dilatation of the ventricles and sulci. Mild chronic small vessel ischemic changes noted within the deep periventricular white matter regions bilaterally. There is no mass, hemorrhage, edema or other evidence of acute parenchymal abnormality. No extra-axial hemorrhage. Vascular: There are chronic calcified atherosclerotic changes of the large vessels at the skull base. No unexpected hyperdense vessel. Skull: Normal. Negative for fracture or focal lesion. Sinuses/Orbits: Extensive chronic appearing mucosal thickening throughout the bilateral maxillary sinuses. Orbital/periorbital soft tissues are unremarkable. Other: None. IMPRESSION: 1. No acute findings.  No intracranial mass, hemorrhage or edema. 2. Chronic appearing paranasal sinus disease. Electronically Signed   By: Bary Richard M.D.   On: 12/10/2016 21:23   Ct Chest Wo Contrast  Result Date: 12/12/2016 CLINICAL DATA:  Sepsis.  Abnormal chest radiograph. EXAM: CT CHEST WITHOUT CONTRAST TECHNIQUE: Multidetector CT imaging of the chest was performed following the standard protocol without IV contrast. COMPARISON:  Chest radiograph from 12/10/2016 FINDINGS: Cardiovascular: The heart size is mildly enlarged. There is aortic atherosclerosis identified. Previous median sternotomy and CABG procedure. Mediastinum/Nodes: The trachea appears patent and is midline. Normal appearance of the esophagus. Calcified mediastinal and hilar lymph nodes are identified compatible with prior granulomatous disease. Noncalcified left paratracheal lymph node measures 1.2 cm, image 50 of series 201. Noncalcified right paratracheal lymph node measures 1.2 cm, image 52 of series 201.  Lungs/Pleura: Diffuse bronchial wall thickening. There are moderate bilateral pleural effusions. There is mild overlying compressive type atelectasis. Interlobular septal thickening within the lung bases compatible with mild edema. A few clustered non-specific pulmonary nodules noted in the left lower lobe noted measure up to 7 mm, image 67 series 205. Upper Abdomen: No acute abnormality. Musculoskeletal: No chest wall mass or suspicious bone lesions identified. IMPRESSION: 1. No evidence for pneumonia. 2. Mild cardiac enlargement, aortic atherosclerosis, mild edema and bilateral pleural effusions. Suspected congestive heart failure. 3. Prior granulomatous disease. 4. Small nonspecific nodules clustered in the left lower lobe. Non-contrast chest CT at 3-6 months is recommended. If the nodules are stable at time of repeat CT, then future CT at 18-24 months (from today's scan) is considered optional for low-risk patients, but is recommended for high-risk patients. This recommendation follows the consensus statement: Guidelines for Management of Incidental Pulmonary Nodules Detected  on CT Images:From the Fleischner Society 2017; published online before print (10.1148/radiol.[id number]). Electronically Signed   By: Signa Kell M.D.   On: 12/12/2016 09:39   Dg Chest Portable 1 View  Result Date: 12/10/2016 CLINICAL DATA:  Fever EXAM: PORTABLE CHEST 1 VIEW COMPARISON:  11/25/2016 FINDINGS: Median sternotomy wires are again evident. A right upper extremity catheter tip is difficult to visualize, catheter tubing is seen to the level of SVC. Mild atelectasis or infiltrate at the right lung base. No effusion. Stable cardiomediastinal silhouette. No pneumothorax. IMPRESSION: Mild hazy atelectasis or infiltrate at the right infrahilar lung. Stable borderline to mild cardiomegaly without overt failure. Electronically Signed   By: Jasmine Pang M.D.   On: 12/10/2016 23:18   Dg Chest Port 1 View  Result Date:  11/25/2016 CLINICAL DATA:  Leukocytosis. EXAM: PORTABLE CHEST 1 VIEW COMPARISON:  11/24/2016 FINDINGS: Prior CABG. Heart is borderline in size. Diffuse interstitial prominence throughout the lungs with peribronchial thickening. Mild hyperinflation. No confluent opacity or effusion. No acute bony abnormality. IMPRESSION: Hyperinflation/ COPD.  Chronic bronchitic changes. Electronically Signed   By: Charlett Nose M.D.   On: 11/25/2016 09:41   Dg Chest Port 1 View  Result Date: 11/24/2016 CLINICAL DATA:  Initial evaluation for acute shortness of breath. EXAM: PORTABLE CHEST 1 VIEW COMPARISON:  Prior radiograph from 11/21/2016. FINDINGS: Median sternotomy wires with underlying CABG markers and surgical clips, stable. Mild cardiomegaly is unchanged. Mediastinal silhouette within normal limits. Aortic atherosclerosis noted. Lungs normally inflated. No focal infiltrates. No pulmonary edema or pleural effusion. No pneumothorax. No acute osseous abnormality. IMPRESSION: 1. No active cardiopulmonary disease. 2. Mild cardiomegaly with sequelae of prior CABG. Electronically Signed   By: Rise Mu M.D.   On: 11/24/2016 22:00   Dg Chest Portable 1 View  Result Date: 11/21/2016 CLINICAL DATA:  Pt with weight gain this AM of 2 lbs, pt has increased SOB. Hx CHF, COPD, CAD, afib EXAM: PORTABLE CHEST 1 VIEW COMPARISON:  10/13/2016 FINDINGS: Status post median sternotomy and CABG. Heart size is normal. Lungs are clear. No pulmonary edema. Degenerative changes are seen in thoracic spine. IMPRESSION: No evidence for acute  abnormality. Electronically Signed   By: Norva Pavlov M.D.   On: 11/21/2016 17:03   Dg Swallowing Func-speech Pathology  Result Date: 12/17/2016 Objective Swallowing Evaluation: Type of Study: MBS-Modified Barium Swallow Study Patient Details Name: Devin Heath MRN: [id number] Date of Birth: March 19, 1929 Today's Date: 12/17/2016 Time: SLP Start Time (ACUTE ONLY): 1047-SLP Stop Time (ACUTE ONLY):  1108 SLP Time Calculation (min) (ACUTE ONLY): 21 min Past Medical History: Past Medical History: Diagnosis Date . A-fib (HCC)  . Arthritis  . CAD (coronary artery disease)  . COPD (chronic obstructive pulmonary disease) (HCC)  . Diabetes (HCC)  . Former smoker  . Gout  . Hard of hearing  . Heart failure (HCC)  . Hyperlipidemia  . IBS (irritable bowel syndrome)  . Ischemic cardiomyopathy  . On home oxygen therapy   "2L q hs" (10/13/2016) . OSA on CPAP  . Renal insufficiency  . Rheumatoid arthritis (HCC)  . RLS (restless legs syndrome)  . Vitamin D deficiency  Past Surgical History: Past Surgical History: Procedure Laterality Date . APPENDECTOMY   . CARDIAC SURGERY    BY PASS . COLONOSCOPY WITH PROPOFOL N/A 11/25/2016  Procedure: COLONOSCOPY WITH PROPOFOL;  Surgeon: Iva Boop, MD;  [facility] ENDOSCOPY;  Service: Endoscopy;  Laterality: N/A; . ESOPHAGOGASTRODUODENOSCOPY N/A 11/24/2016  Procedure: ESOPHAGOGASTRODUODENOSCOPY (EGD);  Surgeon: Iva Boop, MD;  Location: [facility] ENDOSCOPY;  Service: Endoscopy;  Laterality: N/A; . FINGER AMPUTATION Left   RING FINGER . GIVENS CAPSULE STUDY N/A 11/25/2016  Procedure: GIVENS CAPSULE STUDY;  Surgeon: Iva Boop, MD;  Location: [facility] ENDOSCOPY;  Service: Endoscopy;  Laterality: N/A; . NECK SURGERY   . TONSILLECTOMY   HPI: 83 y.o.malewith medical history significant of A. fib, osteoarthritis, CAD, diastolic dysfunction, COPD, type 2 diabetes, gout, hyperlipidemia, IBS, obstructive sleep apnea on CPAP, renal insufficiency, rheumatoid arthritis, restless leg syndrome who was recently discharged from the hospital due to GI bleed secondary to anticoagulation for atrial fibrillation, now brought from nursing home facility for evaluation of altered mental status and fever for the past 2 days. Dx HCAP, acute on chronic renal insufficiency. CXR 12/10/16 impression: Mild hazy atelectasis or infiltrate at the right infrahilar lung. Stable borderline to mild cardiomegaly without overt  failure. Head CT 12/10/16 showed no acute findings. FEES 1/24 unable to fully determine safety due to obstucted view (po's on scope tip), therefore MBS today.  Subjective: "Oh, I can't do it" Assessment / Plan / Recommendation CHL IP CLINICAL IMPRESSIONS 12/17/2016 Therapy Diagnosis Mild oral phase dysphagia;Moderate pharyngeal phase dysphagia Clinical Impression Pt exhibits mild oral and moderate pharyngeal dysphagia. Oral deficits included decreased oral cohesion resulting in sublingual spilling and prolonged oral transit time. Pt presented with a suspected structual based pharyngeal dysphagia due to presence of bony structure (osteophyte? not confirmed by radiologist) at the level of cervical vertebrae preventing epiglottic inversion resulting in silent penetration to the level of the vocal cords with thin liquids via straw. Pt may have experienced premorbid swallowing difficulties due to cervical protrusion and compensated for however now unable due to present illness and decreased functional reserve. Laryngeal elevation mildly decreased. He exhibited moderate vallecular/pyriform sinuses residue and was unable to perform second volitional swallow and mildly delayed swallow initiation. Pt is at higher aspiration risk with all consistencies. If family agreeable to comfort feeds, SLP recommends Dys 1 diet, thin liquids (no straws), meds crushed in applesauce. Will follow for education needs with family.  Impact on safety and function Severe aspiration risk   CHL IP TREATMENT RECOMMENDATION 12/17/2016 Treatment Recommendations Therapy as outlined in treatment plan below   Prognosis 12/17/2016 Prognosis for Safe Diet Advancement (No Data) Barriers to Reach Goals Cognitive deficits Barriers/Prognosis Comment -- CHL IP DIET RECOMMENDATION 12/17/2016 SLP Diet Recommendations Thin liquid;Dysphagia 1 (Puree) solids Liquid Administration via Cup;No straw Medication Administration Crushed with puree Compensations Slow rate;Small  sips/bites Postural Changes Seated upright at 90 degrees   CHL IP OTHER RECOMMENDATIONS 12/17/2016 Recommended Consults -- Oral Care Recommendations Oral care BID Other Recommendations --   CHL IP FOLLOW UP RECOMMENDATIONS 12/17/2016 Follow up Recommendations Skilled Nursing facility   [facility] IP FREQUENCY AND DURATION 12/17/2016 Speech Therapy Frequency (ACUTE ONLY) min 2x/week Treatment Duration 2 weeks      CHL IP ORAL PHASE 12/17/2016 Oral Phase Impaired Oral - Pudding Teaspoon -- Oral - Pudding Cup -- Oral - Honey Teaspoon -- Oral - Honey Cup -- Oral - Nectar Teaspoon -- Oral - Nectar Cup Decreased bolus cohesion;Lingual/palatal residue;Weak lingual manipulation;Delayed oral transit Oral - Nectar Straw -- Oral - Thin Teaspoon -- Oral - Thin Cup Decreased bolus cohesion;Lingual/palatal residue;Weak lingual manipulation;Delayed oral transit Oral - Thin Straw Delayed oral transit;Decreased bolus cohesion;Weak lingual manipulation;Lingual/palatal residue Oral - Puree NT Oral - Mech Soft -- Oral - Regular Delayed oral transit;Decreased bolus cohesion;Weak lingual manipulation;Lingual/palatal residue Oral - Multi-Consistency -- Oral - Pill -- Oral Phase -  Comment --  CHL IP PHARYNGEAL PHASE 12/17/2016 Pharyngeal Phase Impaired Pharyngeal- Pudding Teaspoon -- Pharyngeal -- Pharyngeal- Pudding Cup -- Pharyngeal -- Pharyngeal- Honey Teaspoon -- Pharyngeal -- Pharyngeal- Honey Cup Reduced laryngeal elevation;Reduced epiglottic inversion;Pharyngeal residue - valleculae;Pharyngeal residue - posterior pharnyx Pharyngeal -- Pharyngeal- Nectar Teaspoon -- Pharyngeal -- Pharyngeal- Nectar Cup Pharyngeal residue - valleculae;Reduced epiglottic inversion;Reduced laryngeal elevation;Pharyngeal residue - posterior pharnyx;Penetration/Aspiration before swallow Pharyngeal (No Data) Pharyngeal- Nectar Straw -- Pharyngeal -- Pharyngeal- Thin Teaspoon -- Pharyngeal -- Pharyngeal- Thin Cup Delayed swallow initiation-vallecula;Pharyngeal residue  - valleculae;Pharyngeal residue - posterior pharnyx;Reduced laryngeal elevation;Reduced epiglottic inversion Pharyngeal -- Pharyngeal- Thin Straw Reduced epiglottic inversion;Reduced laryngeal elevation;Pharyngeal residue - pyriform;Delayed swallow initiation-pyriform sinuses;Pharyngeal residue - posterior pharnyx;Pharyngeal residue - valleculae;Penetration/Aspiration before swallow Pharyngeal -- Pharyngeal- Puree NT Pharyngeal -- Pharyngeal- Mechanical Soft -- Pharyngeal -- Pharyngeal- Regular Delayed swallow initiation-vallecula;Pharyngeal residue - valleculae;Reduced tongue base retraction Pharyngeal -- Pharyngeal- Multi-consistency -- Pharyngeal -- Pharyngeal- Pill -- Pharyngeal -- Pharyngeal Comment --  CHL IP CERVICAL ESOPHAGEAL PHASE 12/17/2016 Cervical Esophageal Phase WFL Pudding Teaspoon -- Pudding Cup -- Honey Teaspoon -- Honey Cup -- Nectar Teaspoon -- Nectar Cup -- Nectar Straw -- Thin Teaspoon -- Thin Cup -- Thin Straw -- Puree -- Mechanical Soft -- Regular -- Multi-consistency -- Pill -- Cervical Esophageal Comment -- No flowsheet data found. Royce Macadamia 12/17/2016, 2:14 PM  Breck Coons Lonell [employer] [phone]              Microbiology: Recent Results (from the past 240 hour(s))  Respiratory Panel by PCR     Status: None   Collection Time: 12/10/16  7:58 PM  Result Value Ref Range Status   Adenovirus NOT DETECTED NOT DETECTED Final   Coronavirus 229E NOT DETECTED NOT DETECTED Final   Coronavirus HKU1 NOT DETECTED NOT DETECTED Final   Coronavirus NL63 NOT DETECTED NOT DETECTED Final   Coronavirus OC43 NOT DETECTED NOT DETECTED Final   Metapneumovirus NOT DETECTED NOT DETECTED Final   Rhinovirus / Enterovirus NOT DETECTED NOT DETECTED Final   Influenza A NOT DETECTED NOT DETECTED Final   Influenza B NOT DETECTED NOT DETECTED Final   Parainfluenza Virus 1 NOT DETECTED NOT DETECTED Final   Parainfluenza Virus 2 NOT DETECTED NOT DETECTED Final   Parainfluenza Virus 3  NOT DETECTED NOT DETECTED Final   Parainfluenza Virus 4 NOT DETECTED NOT DETECTED Final   Respiratory Syncytial Virus NOT DETECTED NOT DETECTED Final   Bordetella pertussis NOT DETECTED NOT DETECTED Final   Chlamydophila pneumoniae NOT DETECTED NOT DETECTED Final   Mycoplasma pneumoniae NOT DETECTED NOT DETECTED Final  Blood Culture (routine x 2)     Status: Abnormal   Collection Time: 12/10/16  8:18 PM  Result Value Ref Range Status   Specimen Description BLOOD LEFT HAND  Final   Special Requests IN PEDIATRIC BOTTLE  Final   Culture  Setup Time   Final    GRAM POSITIVE COCCI IN CLUSTERS IN PEDIATRIC BOTTLE CRITICAL RESULT CALLED TO, READ BACK BY AND VERIFIED WITH: A ANDERSON 12/12/16 @ 0807 M VESTAL    Culture (A)  Final    STAPHYLOCOCCUS SPECIES (COAGULASE NEGATIVE) THE SIGNIFICANCE OF ISOLATING THIS ORGANISM FROM A SINGLE SET OF BLOOD CULTURES WHEN MULTIPLE SETS ARE DRAWN IS UNCERTAIN. PLEASE NOTIFY THE MICROBIOLOGY DEPARTMENT WITHIN ONE WEEK IF SPECIATION AND SENSITIVITIES ARE REQUIRED.    Report Status 12/14/2016 FINAL  Final  Blood Culture ID Panel (Reflexed)     Status: Abnormal   Collection Time: 12/10/16  8:18 PM  Result Value Ref Range Status   Enterococcus species NOT DETECTED NOT DETECTED Final   Listeria monocytogenes NOT DETECTED NOT DETECTED Final   Staphylococcus species DETECTED (A) NOT DETECTED Final    Comment: CRITICAL RESULT CALLED TO, READ BACK BY AND VERIFIED WITH: A ANDERSON 12/12/16 @ 0807 M VESTAL    Staphylococcus aureus NOT DETECTED NOT DETECTED Final   Methicillin resistance NOT DETECTED NOT DETECTED Final   Streptococcus species NOT DETECTED NOT DETECTED Final   Streptococcus agalactiae NOT DETECTED NOT DETECTED Final   Streptococcus pneumoniae NOT DETECTED NOT DETECTED Final   Streptococcus pyogenes NOT DETECTED NOT DETECTED Final   Acinetobacter baumannii NOT DETECTED NOT DETECTED Final   Enterobacteriaceae species NOT DETECTED NOT DETECTED Final    Enterobacter cloacae complex NOT DETECTED NOT DETECTED Final   Escherichia coli NOT DETECTED NOT DETECTED Final   Klebsiella oxytoca NOT DETECTED NOT DETECTED Final   Klebsiella pneumoniae NOT DETECTED NOT DETECTED Final   Proteus species NOT DETECTED NOT DETECTED Final   Serratia marcescens NOT DETECTED NOT DETECTED Final   Haemophilus influenzae NOT DETECTED NOT DETECTED Final   Neisseria meningitidis NOT DETECTED NOT DETECTED Final   Pseudomonas aeruginosa NOT DETECTED NOT DETECTED Final   Candida albicans NOT DETECTED NOT DETECTED Final   Candida glabrata NOT DETECTED NOT DETECTED Final   Candida krusei NOT DETECTED NOT DETECTED Final   Candida parapsilosis NOT DETECTED NOT DETECTED Final   Candida tropicalis NOT DETECTED NOT DETECTED Final  Urine culture     Status: None   Collection Time: 12/10/16  9:37 PM  Result Value Ref Range Status   Specimen Description URINE, CATHETERIZED  Final   Special Requests NONE  Final   Culture NO GROWTH  Final   Report Status 12/12/2016 FINAL  Final  Blood Culture (routine x 2)     Status: None   Collection Time: 12/10/16 11:08 PM  Result Value Ref Range Status   Specimen Description BLOOD LEFT HAND  Final   Special Requests IN PEDIATRIC BOTTLE  Final   Culture NO GROWTH 5 DAYS  Final   Report Status 12/16/2016 FINAL  Final     Labs: Basic Metabolic Panel:  Recent Labs Lab 12/13/16 0600  12/15/16 0430 12/15/16 1720 12/16/16 0432 12/17/16 0500 12/18/16 0310  NA 148*  < > 151* 152* 151* 153* 156*  K 3.4*  < > 3.0* 2.9* 2.9* 3.2* 3.4*  CL 113*  < > 111 109 104 105 113*  CO2 23  < > 29 32 32 31 31  GLUCOSE 177*  < > 203* 176* 189* 163* 152*  BUN 25*  < > 18 18 17 20  22*  CREATININE 1.79*  < > 1.89* 1.89* 2.02* 2.45* 2.41*  CALCIUM 8.2*  < > 8.2* 8.6* 8.5* 8.4* 8.3*  MG 2.3  --   --   --   --   --   --   < > = values in this interval not displayed. Liver Function Tests:  Recent Labs Lab 12/15/16 1720  ALBUMIN 2.2*   No  results for input(s): LIPASE, AMYLASE in the last 168 hours.  Recent Labs Lab 12/12/16 1420  AMMONIA 30   CBC:  Recent Labs Lab 12/13/16 0600  12/15/16 0430 12/16/16 0432 12/17/16 0500 12/17/16 2010 12/18/16 0310  WBC 6.7  < > 8.0 8.2 8.2 7.0 6.9  NEUTROABS 5.6  --   --   --   --   --   --  HGB 7.7*  < > 7.5* 8.2* 6.6* 9.1* 8.9*  HCT 25.4*  < > 25.3* 28.0* 23.1* 31.4* 30.6*  MCV 87.3  < > 87.5 87.8 89.2 88.2 88.7  PLT 201  < > 246 271 311 280 275  < > = values in this interval not displayed. Cardiac Enzymes: No results for input(s): CKTOTAL, CKMB, CKMBINDEX, TROPONINI in the last 168 hours. BNP: BNP (last 3 results)  Recent Labs  10/13/16 1320 11/09/16 1302  BNP 114.8* 79.9     CBG:  Recent Labs Lab 12/17/16 1307 12/17/16 1640 12/17/16 2122 12/18/16 0752 12/18/16 1133  GLUCAP 168* 145* 115* 148* 147*       Signed:  Mauro Kaufmann S MD.  [facility] 12/24/16, 10:05 AM

## 2016-12-19 NOTE — Clinical Social Work Note (Signed)
CSW facilitated patient discharge including contacting patient family and facility to confirm patient discharge plans. Clinical information faxed to facility and family agreeable with plan. CSW arranged ambulance transport via [facility] to [facility]. RN to call report prior to discharge ([phone]).  CSW will sign off for now as social work intervention is no longer needed. Please consult us again if new needs arise.  Willow Reczek, CSW [phone]   

## 2016-12-24 DEATH — deceased

## 2016-12-28 ENCOUNTER — Encounter (HOSPITAL_COMMUNITY): Payer: Medicare Other

## 2017-04-24 NOTE — Addendum Note (Signed)
Addendum  created 04/24/17 1001 by Ismar Yabut, MD   Sign clinical note    

## 2017-04-24 NOTE — Addendum Note (Signed)
Addendum  created 04/24/17 0751 by Heather Roberts, MD   Sign clinical note

## 2017-07-25 IMAGING — CT CT HEAD W/O CM
3 series · 15 of 47 positions shown, 18 images · non-contrast
Comparison: None.

CLINICAL DATA: Fever and hypotension, slurred speech, suspect
sepsis. Assess for CVA.

EXAM:
CT HEAD WITHOUT CONTRAST
TECHNIQUE: Contiguous axial images were obtained from the base of the skull
through the vertex without intravenous contrast.

[Series 4: head 5.0 h30s · axial · 0.42mm/px · z∈[-129,+1]mm · 9 of 32 slices shown, 12 images]
[im 3/32  brain]
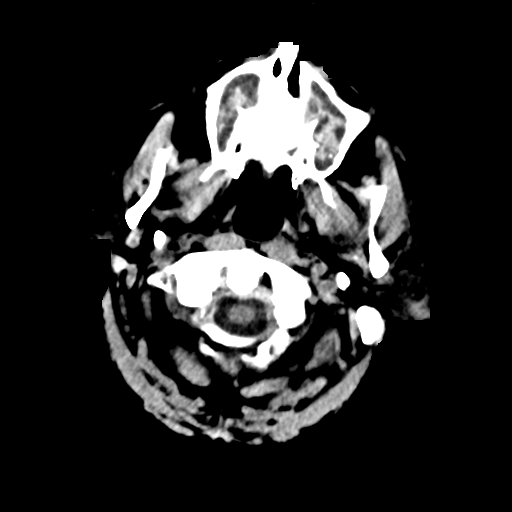
[im 3/32  bone]
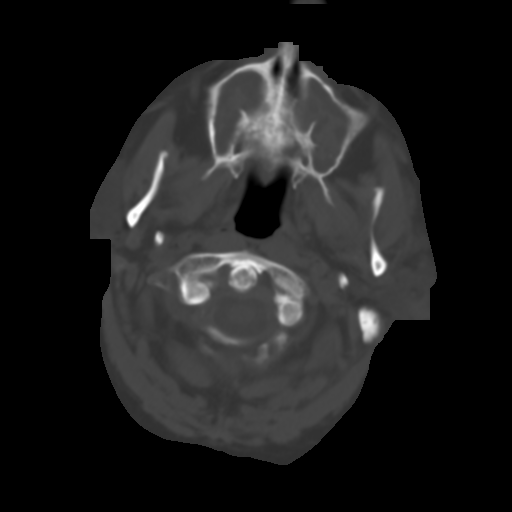
[im 6/32  brain]
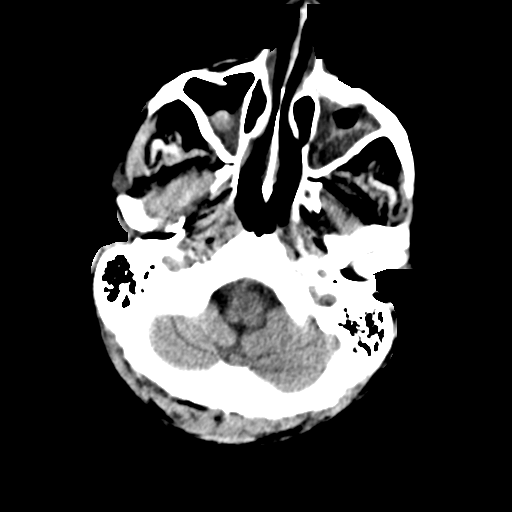
[im 9/32  brain]
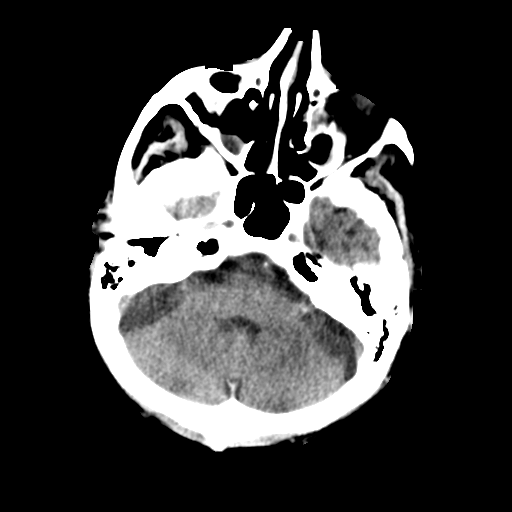
[im 12/32  brain]
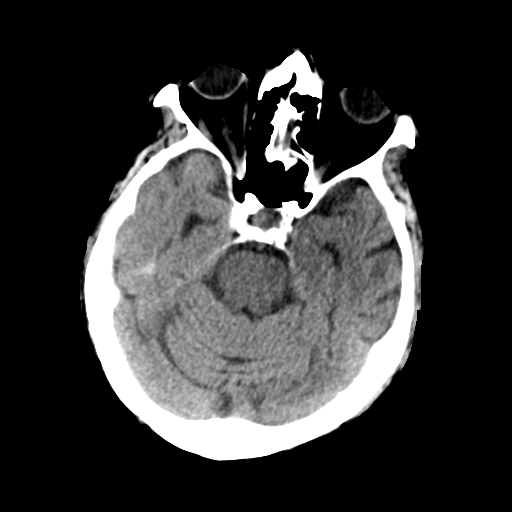
[im 17/32  brain]
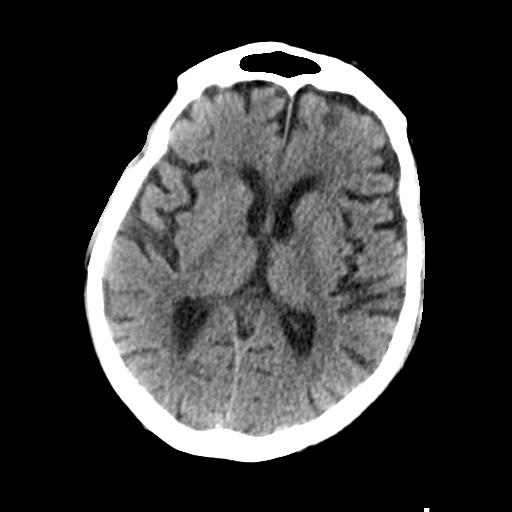
[im 17/32  bone]
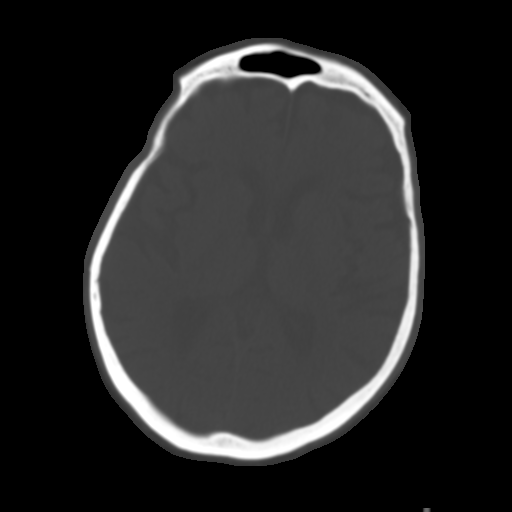
[im 20/32  brain]
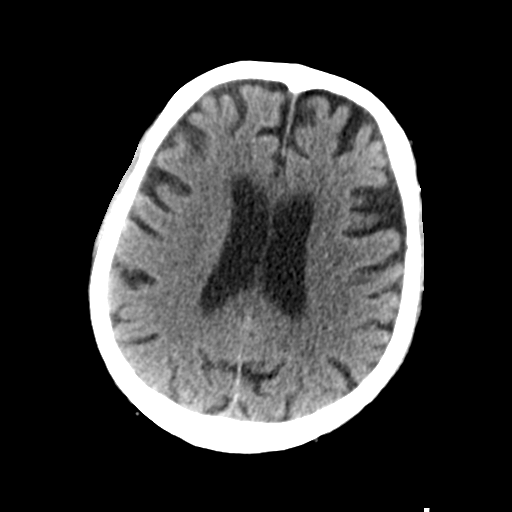
[im 23/32  brain]
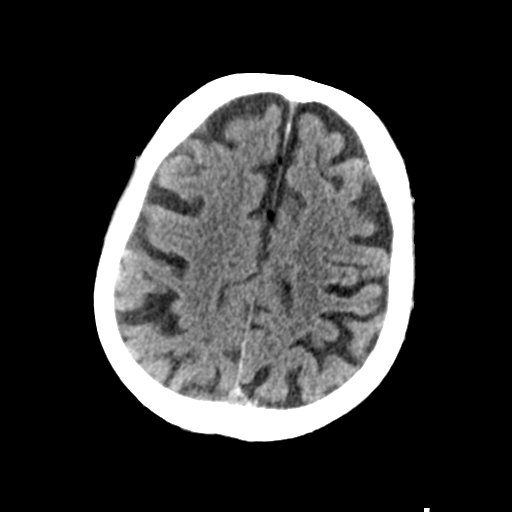
[im 26/32  brain]
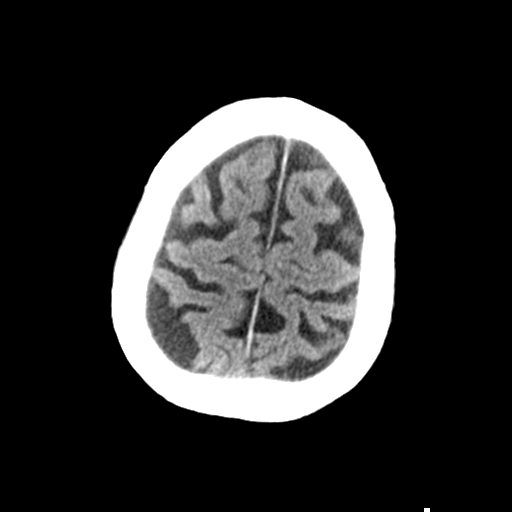
[im 29/32  brain]
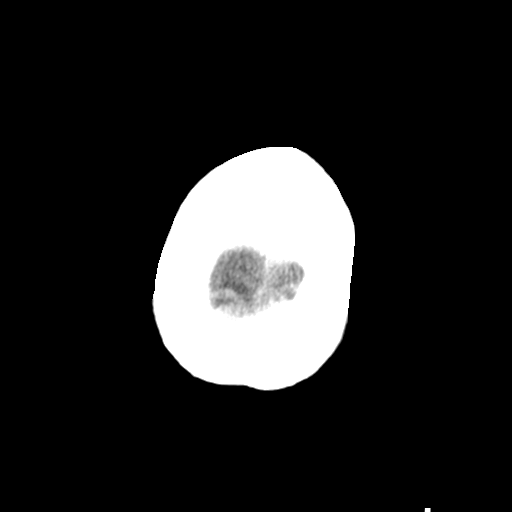
[im 29/32  bone]
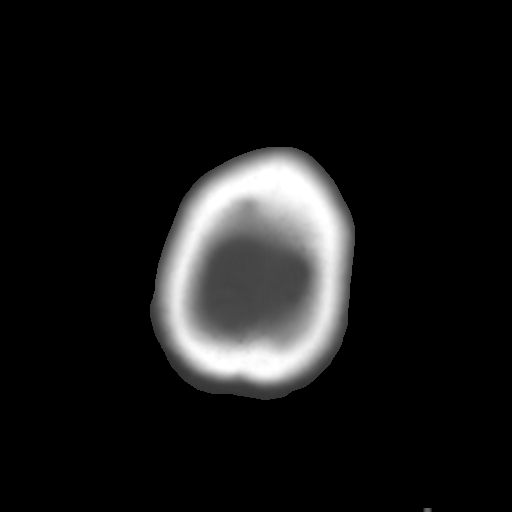

[Series 6: head 3.0 mpr cor · coronal · 0.30mm/px · 3 of 63 slices shown]
[im 21/63  brain]
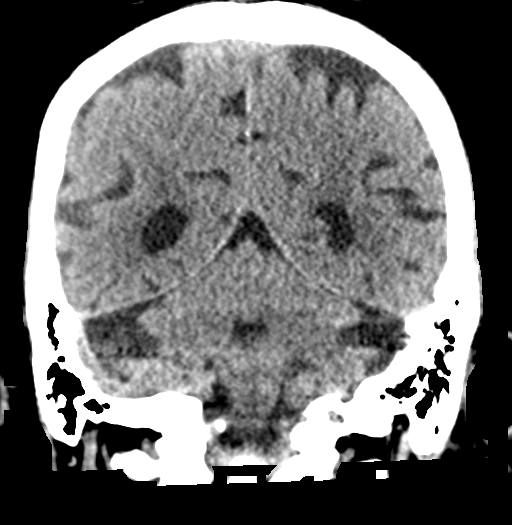
[im 28/63  brain]
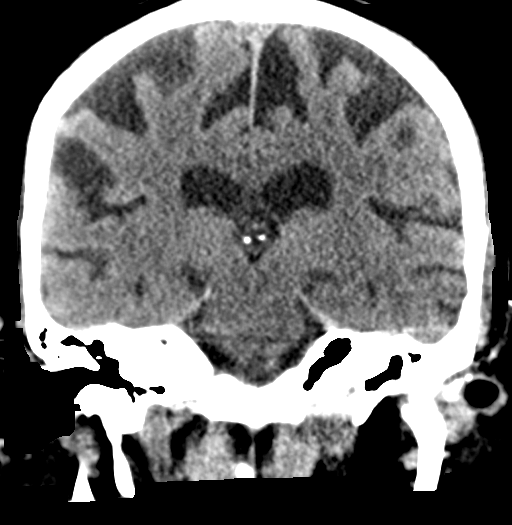
[im 35/63  brain]
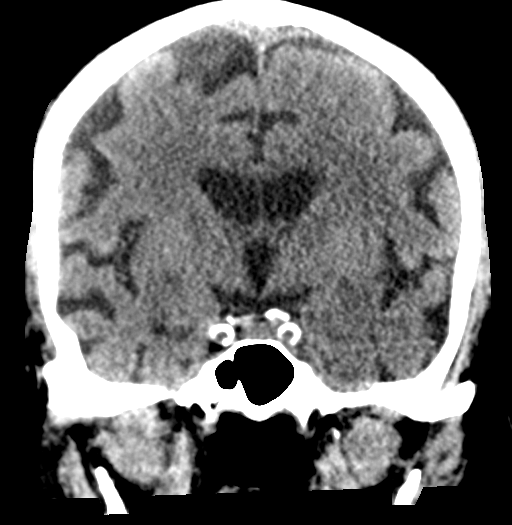

[Series 7: head 3.0 mpr sag · sagittal · 0.31mm/px · 3 of 55 slices shown]
[im 19/55  brain]
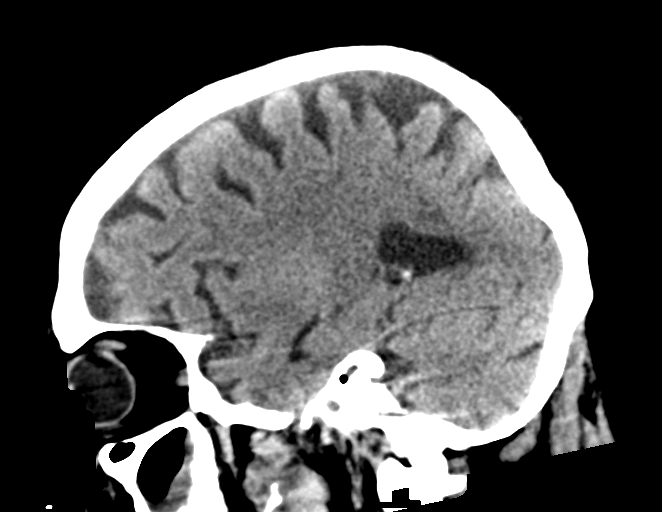
[im 28/55  brain]
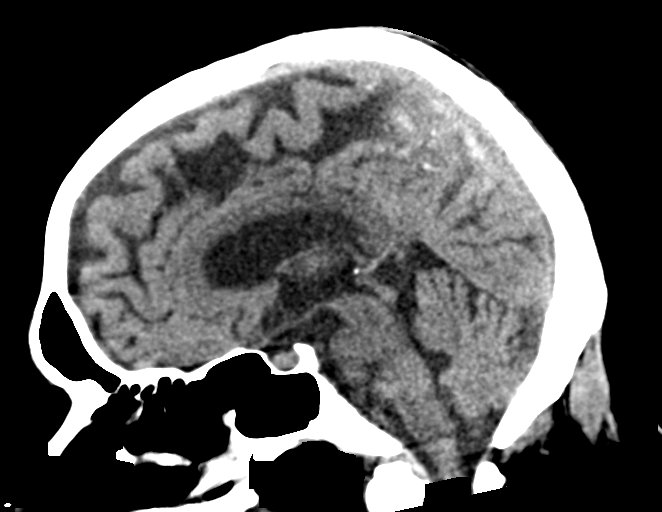
[im 37/55  brain]
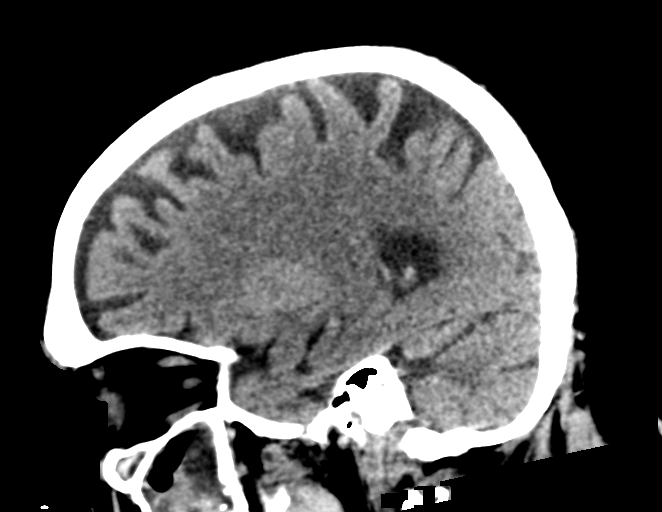

[15 of 47 positions shown; findings below may reference images not displayed]

FINDINGS: Brain: There is generalized age related parenchymal atrophy with
commensurate dilatation of the ventricles and sulci. Mild chronic
small vessel ischemic changes noted within the deep periventricular
white matter regions bilaterally.

There is no mass, hemorrhage, edema or other evidence of acute
parenchymal abnormality. No extra-axial hemorrhage.

Vascular: There are chronic calcified atherosclerotic changes of the
large vessels at the skull base. No unexpected hyperdense vessel.

Skull: Normal. Negative for fracture or focal lesion.

Sinuses/Orbits: Extensive chronic appearing mucosal thickening
throughout the bilateral maxillary sinuses. Orbital/periorbital soft
tissues are unremarkable.

Other: None.
IMPRESSION: 1. No acute findings.  No intracranial mass, hemorrhage or edema.
2. Chronic appearing paranasal sinus disease.
